# Patient Record
Sex: Female | Born: 1955 | Race: White | Hispanic: No | Marital: Married | Smoking: Never smoker
Health system: Southern US, Community
[De-identification: ages and names within clinical notes are randomized; demographics above are authoritative.]

## PROBLEM LIST (undated history)

## (undated) DIAGNOSIS — B019 Varicella without complication: Secondary | ICD-10-CM

## (undated) DIAGNOSIS — M1712 Unilateral primary osteoarthritis, left knee: Secondary | ICD-10-CM

## (undated) DIAGNOSIS — T7840XA Allergy, unspecified, initial encounter: Secondary | ICD-10-CM

## (undated) DIAGNOSIS — M810 Age-related osteoporosis without current pathological fracture: Secondary | ICD-10-CM

## (undated) DIAGNOSIS — M5136 Other intervertebral disc degeneration, lumbar region: Secondary | ICD-10-CM

## (undated) DIAGNOSIS — M51369 Other intervertebral disc degeneration, lumbar region without mention of lumbar back pain or lower extremity pain: Secondary | ICD-10-CM

## (undated) DIAGNOSIS — F32A Depression, unspecified: Secondary | ICD-10-CM

## (undated) DIAGNOSIS — B029 Zoster without complications: Secondary | ICD-10-CM

## (undated) DIAGNOSIS — M1990 Unspecified osteoarthritis, unspecified site: Secondary | ICD-10-CM

## (undated) DIAGNOSIS — I10 Essential (primary) hypertension: Secondary | ICD-10-CM

## (undated) DIAGNOSIS — R519 Headache, unspecified: Secondary | ICD-10-CM

## (undated) HISTORY — DX: Age-related osteoporosis without current pathological fracture: M81.0

## (undated) HISTORY — PX: JOINT REPLACEMENT: SHX530

## (undated) HISTORY — PX: KNEE ARTHROSCOPY W/ PARTIAL MEDIAL MENISCECTOMY: SHX1882

## (undated) HISTORY — PX: FRACTURE SURGERY: SHX138

## (undated) HISTORY — DX: Allergy, unspecified, initial encounter: T78.40XA

## (undated) HISTORY — PX: TONSILLECTOMY: SUR1361

## (undated) HISTORY — DX: Depression, unspecified: F32.A

## (undated) HISTORY — PX: CHONDROPLASTY: SHX5177

---

## 1992-12-14 HISTORY — PX: GANGLION CYST EXCISION: SHX1691

## 2001-07-08 ENCOUNTER — Encounter: Payer: Self-pay | Admitting: Infectious Diseases

## 2001-07-08 ENCOUNTER — Ambulatory Visit (HOSPITAL_COMMUNITY): Admission: RE | Admit: 2001-07-08 | Discharge: 2001-07-08 | Payer: Self-pay | Admitting: Infectious Diseases

## 2001-08-21 ENCOUNTER — Emergency Department (HOSPITAL_COMMUNITY): Admission: EM | Admit: 2001-08-21 | Discharge: 2001-08-21 | Payer: Self-pay | Admitting: *Deleted

## 2006-05-31 ENCOUNTER — Ambulatory Visit: Payer: Self-pay | Admitting: Gastroenterology

## 2008-08-06 ENCOUNTER — Ambulatory Visit: Payer: Self-pay | Admitting: Gastroenterology

## 2008-08-06 LAB — HM COLONOSCOPY

## 2011-01-28 LAB — HM PAP SMEAR

## 2011-06-25 HISTORY — PX: FRACTURE SURGERY: SHX138

## 2011-07-05 HISTORY — PX: JOINT REPLACEMENT: SHX530

## 2012-09-06 LAB — HM MAMMOGRAPHY

## 2015-09-30 DIAGNOSIS — M8580 Other specified disorders of bone density and structure, unspecified site: Secondary | ICD-10-CM

## 2015-09-30 DIAGNOSIS — E669 Obesity, unspecified: Secondary | ICD-10-CM

## 2015-09-30 DIAGNOSIS — F329 Major depressive disorder, single episode, unspecified: Secondary | ICD-10-CM

## 2015-09-30 DIAGNOSIS — F32A Depression, unspecified: Secondary | ICD-10-CM

## 2015-09-30 DIAGNOSIS — E739 Lactose intolerance, unspecified: Secondary | ICD-10-CM | POA: Insufficient documentation

## 2015-09-30 DIAGNOSIS — M8588 Other specified disorders of bone density and structure, other site: Secondary | ICD-10-CM | POA: Insufficient documentation

## 2015-09-30 DIAGNOSIS — E28319 Asymptomatic premature menopause: Secondary | ICD-10-CM | POA: Insufficient documentation

## 2015-09-30 DIAGNOSIS — I10 Essential (primary) hypertension: Secondary | ICD-10-CM | POA: Insufficient documentation

## 2015-09-30 DIAGNOSIS — Z800 Family history of malignant neoplasm of digestive organs: Secondary | ICD-10-CM | POA: Insufficient documentation

## 2015-10-02 ENCOUNTER — Encounter: Payer: Self-pay | Admitting: Unknown Physician Specialty

## 2015-10-02 ENCOUNTER — Ambulatory Visit (INDEPENDENT_AMBULATORY_CARE_PROVIDER_SITE_OTHER): Payer: BC Managed Care – PPO | Admitting: Unknown Physician Specialty

## 2015-10-02 ENCOUNTER — Other Ambulatory Visit: Payer: Self-pay | Admitting: Unknown Physician Specialty

## 2015-10-02 VITALS — BP 137/83 | HR 71 | Temp 97.8°F | Ht 66.7 in | Wt 182.0 lb

## 2015-10-02 DIAGNOSIS — Z23 Encounter for immunization: Secondary | ICD-10-CM | POA: Diagnosis not present

## 2015-10-02 DIAGNOSIS — M545 Low back pain: Secondary | ICD-10-CM | POA: Diagnosis not present

## 2015-10-02 DIAGNOSIS — Z780 Asymptomatic menopausal state: Secondary | ICD-10-CM | POA: Diagnosis not present

## 2015-10-02 DIAGNOSIS — Z0000 Encounter for general adult medical examination without abnormal findings: Secondary | ICD-10-CM

## 2015-10-02 LAB — UA/M W/RFLX CULTURE, ROUTINE
Bilirubin, UA: NEGATIVE
Glucose, UA: NEGATIVE
Ketones, UA: NEGATIVE
Leukocytes, UA: NEGATIVE
Nitrite, UA: NEGATIVE
Protein, UA: NEGATIVE
RBC, UA: NEGATIVE
Specific Gravity, UA: 1.015 (ref 1.005–1.030)
Urobilinogen, Ur: 0.2 mg/dL (ref 0.2–1.0)
pH, UA: 5 (ref 5.0–7.5)

## 2015-10-02 MED ORDER — NAPROXEN 500 MG PO TABS
500.0000 mg | ORAL_TABLET | Freq: Two times a day (BID) | ORAL | Status: DC
Start: 2015-10-02 — End: 2017-09-07

## 2015-10-02 MED ORDER — CYCLOBENZAPRINE HCL 10 MG PO TABS: 10.0000 mg | ORAL_TABLET | Freq: Three times a day (TID) | ORAL | Status: DC | PRN

## 2015-10-02 NOTE — Progress Notes (Signed)
BP 137/83 mmHg  Pulse 71  Temp(Src) 97.8 F (36.6 C)  Ht 5' 6.7" (1.694 m)  Wt 182 lb (82.555 kg)  BMI 28.77 kg/m2  SpO2 97%  LMP  (LMP Unknown)   Subjective:    Patient ID: Mary Wood, female    DOB: 07/28/1956, 59 y.o.   MRN: [id number]  HPI: Mary Wood is a 59 y.o. female  Chief Complaint  Patient presents with  . Annual Exam  . Back Pain    pt states she has back pain that started Sunday night   Back pain this weekend after doing a lot of yard work.    Relevant past medical, surgical, family and social history reviewed and updated as indicated. Interim medical history since our last visit reviewed. Allergies and medications reviewed and updated.  Review of Systems  Constitutional: Negative.   HENT: Negative.   Eyes: Negative.   Respiratory: Negative.   Cardiovascular: Negative.   Gastrointestinal: Negative.   Endocrine: Negative.   Genitourinary: Negative.   Musculoskeletal: Positive for back pain.  Skin: Negative.   Allergic/Immunologic: Negative.   Neurological: Negative.   Hematological: Negative.   Psychiatric/Behavioral: Negative.    Depression screen PHQ 2/9 10/02/2015  Decreased Interest 1  Down, Depressed, Hopeless 1  PHQ - 2 Score 2  Altered sleeping 1  Tired, decreased energy 1  Change in appetite 0  Feeling bad or failure about yourself  0  Trouble concentrating 0  Moving slowly or fidgety/restless 0  Suicidal thoughts 0  PHQ-9 Score 4     Per HPI unless specifically indicated above     Objective:    BP 137/83 mmHg  Pulse 71  Temp(Src) 97.8 F (36.6 C)  Ht 5' 6.7" (1.694 m)  Wt 182 lb (82.555 kg)  BMI 28.77 kg/m2  SpO2 97%  LMP  (LMP Unknown)  Wt Readings from Last 3 Encounters:  10/02/15 182 lb (82.555 kg)  02/04/15 211 lb (95.709 kg)    Physical Exam  Constitutional: She is oriented to person, place, and time. She appears well-developed and well-nourished.  HENT:  Head: Normocephalic and atraumatic.  Eyes: Pupils  are equal, round, and reactive to light. Right eye exhibits no discharge. Left eye exhibits no discharge. No scleral icterus.  Neck: Normal range of motion. Neck supple. Carotid bruit is not present. No thyromegaly present.  Cardiovascular: Normal rate, regular rhythm and normal heart sounds.  Exam reveals no gallop and no friction rub.   No murmur heard. Pulmonary/Chest: Effort normal and breath sounds normal. No respiratory distress. She has no wheezes. She has no rales.  Abdominal: Soft. Bowel sounds are normal. There is no tenderness. There is no rebound.  Genitourinary: Vagina normal and uterus normal. No breast swelling, tenderness or discharge. Cervix exhibits no motion tenderness, no discharge and no friability. Right adnexum displays no mass, no tenderness and no fullness. Left adnexum displays no mass, no tenderness and no fullness.  Musculoskeletal: Normal range of motion.  Lymphadenopathy:    She has no cervical adenopathy.  Neurological: She is alert and oriented to person, place, and time.  Skin: Skin is warm, dry and intact. No rash noted.  Psychiatric: She has a normal mood and affect. Her speech is normal and behavior is normal. Judgment and thought content normal. Cognition and memory are normal.     Assessment & Plan:   Problem List Items Addressed This Visit    None    Visit Diagnoses    Bilateral low back  pain, with sciatica presence unspecified    -  Primary    Relevant Medications    ibuprofen (ADVIL,MOTRIN) 800 MG tablet    naproxen (NAPROSYN) 500 MG tablet    cyclobenzaprine (FLEXERIL) 10 MG tablet    Other Relevant Orders    UA/M w/rflx Culture, Routine    Routine general medical examination at a health care facility        Relevant Orders    CBC with Differential/Platelet    Comprehensive metabolic panel    IGP, Aptima HPV, rfx 16/18,45    TSH    Lipid Panel w/o Chol/HDL Ratio    Varicella-zoster vaccine subcutaneous    MM DIGITAL SCREENING BILATERAL     HIV antibody    Hepatitis C antibody    Menopause        Relevant Orders    DG Bone Density        Follow up plan: Return if symptoms worsen or fail to improve.

## 2015-10-02 NOTE — Patient Instructions (Signed)
Please do call to schedule your mammogram; the number to schedule one at either [facility] or [facility] is [phone]   

## 2015-10-03 ENCOUNTER — Ambulatory Visit
Admission: RE | Admit: 2015-10-03 | Discharge: 2015-10-03 | Disposition: A | Discharge status: Home / Self Care | Payer: BC Managed Care – PPO | Source: Ambulatory Visit | Attending: Unknown Physician Specialty | Admitting: Unknown Physician Specialty

## 2015-10-03 ENCOUNTER — Encounter: Payer: Self-pay | Admitting: Unknown Physician Specialty

## 2015-10-03 DIAGNOSIS — Z1231 Encounter for screening mammogram for malignant neoplasm of breast: Secondary | ICD-10-CM | POA: Diagnosis not present

## 2015-10-03 DIAGNOSIS — Z0000 Encounter for general adult medical examination without abnormal findings: Secondary | ICD-10-CM

## 2015-10-03 DIAGNOSIS — Z780 Asymptomatic menopausal state: Secondary | ICD-10-CM | POA: Insufficient documentation

## 2015-10-03 LAB — CBC WITH DIFFERENTIAL/PLATELET
Basophils Absolute: 0 10*3/uL (ref 0.0–0.2)
Basos: 0 %
EOS (ABSOLUTE): 0.1 10*3/uL (ref 0.0–0.4)
Eos: 2 %
Hematocrit: 37.7 % (ref 34.0–46.6)
Hemoglobin: 12.8 g/dL (ref 11.1–15.9)
Immature Grans (Abs): 0 10*3/uL (ref 0.0–0.1)
Immature Granulocytes: 0 %
Lymphocytes Absolute: 2.4 10*3/uL (ref 0.7–3.1)
Lymphs: 40 %
MCH: 30.2 pg (ref 26.6–33.0)
MCHC: 34 g/dL (ref 31.5–35.7)
MCV: 89 fL (ref 79–97)
Monocytes Absolute: 0.4 10*3/uL (ref 0.1–0.9)
Monocytes: 7 %
Neutrophils Absolute: 3.1 10*3/uL (ref 1.4–7.0)
Neutrophils: 51 %
Platelets: 327 10*3/uL (ref 150–379)
RBC: 4.24 x10E6/uL (ref 3.77–5.28)
RDW: 13.5 % (ref 12.3–15.4)
WBC: 5.9 10*3/uL (ref 3.4–10.8)

## 2015-10-03 LAB — COMPREHENSIVE METABOLIC PANEL
ALBUMIN: 4.7 g/dL (ref 3.6–4.8)
ALK PHOS: 64 IU/L (ref 39–117)
ALT: 17 IU/L (ref 0–32)
AST: 17 IU/L (ref 0–40)
Albumin/Globulin Ratio: 1.9 (ref 1.1–2.5)
BUN / CREAT RATIO: 26 (ref 11–26)
BUN: 18 mg/dL (ref 8–27)
Bilirubin Total: 0.4 mg/dL (ref 0.0–1.2)
CO2: 24 mmol/L (ref 18–29)
CREATININE: 0.7 mg/dL (ref 0.57–1.00)
Calcium: 9.4 mg/dL (ref 8.7–10.3)
Chloride: 103 mmol/L (ref 97–106)
GFR calc Af Amer: 108 mL/min/{1.73_m2} (ref >59)
GFR calc non Af Amer: 94 mL/min/{1.73_m2} (ref >59)
GLUCOSE: 102 mg/dL — AB (ref 65–99)
Globulin, Total: 2.5 g/dL (ref 1.5–4.5)
Potassium: 4.8 mmol/L (ref 3.5–5.2)
Sodium: 143 mmol/L (ref 136–144)
Total Protein: 7.2 g/dL (ref 6.0–8.5)

## 2015-10-03 LAB — TSH: TSH: 1.06 u[IU]/mL (ref 0.450–4.500)

## 2015-10-03 LAB — HEPATITIS C ANTIBODY

## 2015-10-03 LAB — LIPID PANEL W/O CHOL/HDL RATIO
CHOLESTEROL TOTAL: 180 mg/dL (ref 100–199)
HDL: 60 mg/dL (ref >39)
LDL Calculated: 107 mg/dL — ABNORMAL HIGH (ref 0–99)
TRIGLYCERIDES: 65 mg/dL (ref 0–149)
VLDL CHOLESTEROL CAL: 13 mg/dL (ref 5–40)

## 2015-10-03 LAB — HIV ANTIBODY (ROUTINE TESTING W REFLEX): HIV Screen 4th Generation wRfx: NONREACTIVE

## 2015-10-03 NOTE — Progress Notes (Signed)
Quick Note:  Normal labs. Patient notified by letter. ______ 

## 2015-10-04 ENCOUNTER — Telehealth: Payer: Self-pay | Admitting: Family Medicine

## 2015-10-04 NOTE — Telephone Encounter (Signed)
Please make sure she is scheduled for follow up imaging for incomplete mammogram. Thanks!

## 2015-10-07 ENCOUNTER — Telehealth: Payer: Self-pay | Admitting: Family Medicine

## 2015-10-07 LAB — IGP, APTIMA HPV, RFX 16/18,45
HPV Genotype 16: NEGATIVE
HPV Genotype 18,45: NEGATIVE
PAP Smear Comment: 0

## 2015-10-07 NOTE — Telephone Encounter (Signed)
Got a fax from [name] about the additional imaging orders for this patient. Mary Wood must sign order when she returns and then the patient can be scheduled for the additional images.

## 2015-10-07 NOTE — Telephone Encounter (Signed)
Patient notified. Patient said she had no questions.

## 2015-10-07 NOTE — Telephone Encounter (Signed)
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 Nothing to worry about, she just needs another pap next year.

## 2015-10-09 ENCOUNTER — Other Ambulatory Visit: Payer: Self-pay | Admitting: Unknown Physician Specialty

## 2015-10-09 DIAGNOSIS — R928 Other abnormal and inconclusive findings on diagnostic imaging of breast: Secondary | ICD-10-CM

## 2015-10-09 NOTE — Telephone Encounter (Signed)
Toniann FailWendy from [facility] returned my call and stated that she will call and schedule the patient's follow-up appointments.

## 2015-10-09 NOTE — Telephone Encounter (Signed)
Elnita MaxwellCheryl signed the order and I faxed it back to [facility]. I called and left them a voicemail asking if I call to schedule the patient or if they call the patient to schedule the additional images.

## 2015-10-10 ENCOUNTER — Ambulatory Visit
Admission: RE | Admit: 2015-10-10 | Discharge: 2015-10-10 | Disposition: A | Discharge status: Home / Self Care | Payer: BC Managed Care – PPO | Source: Ambulatory Visit | Attending: Unknown Physician Specialty | Admitting: Unknown Physician Specialty

## 2015-10-10 DIAGNOSIS — R928 Other abnormal and inconclusive findings on diagnostic imaging of breast: Secondary | ICD-10-CM

## 2015-10-21 ENCOUNTER — Encounter: Payer: Self-pay | Admitting: Unknown Physician Specialty

## 2016-07-29 ENCOUNTER — Ambulatory Visit
Admission: RE | Admit: 2016-07-29 | Discharge: 2016-07-29 | Disposition: A | Discharge status: Home / Self Care | Payer: BC Managed Care – PPO | Source: Ambulatory Visit | Attending: Unknown Physician Specialty | Admitting: Unknown Physician Specialty

## 2016-07-29 ENCOUNTER — Ambulatory Visit (INDEPENDENT_AMBULATORY_CARE_PROVIDER_SITE_OTHER): Payer: BC Managed Care – PPO | Admitting: Unknown Physician Specialty

## 2016-07-29 ENCOUNTER — Ambulatory Visit: Payer: BC Managed Care – PPO | Admitting: Unknown Physician Specialty

## 2016-07-29 ENCOUNTER — Encounter: Payer: Self-pay | Admitting: Unknown Physician Specialty

## 2016-07-29 DIAGNOSIS — M25551 Pain in right hip: Secondary | ICD-10-CM

## 2016-07-29 DIAGNOSIS — I10 Essential (primary) hypertension: Secondary | ICD-10-CM

## 2016-07-29 DIAGNOSIS — M25552 Pain in left hip: Secondary | ICD-10-CM | POA: Insufficient documentation

## 2016-07-29 NOTE — Assessment & Plan Note (Signed)
BP elevated today.  This is the first one.  She will begin to check it at home

## 2016-07-29 NOTE — Assessment & Plan Note (Signed)
X-ray.  Discuss tylenol and Glucosamine

## 2016-07-29 NOTE — Progress Notes (Addendum)
BP (!) 169/90 (BP Location: Left Arm, Patient Position: Sitting, Cuff Size: Large)   Pulse 74   Temp 97.9 F (36.6 C)   Ht 5' 7.2" (1.707 m)   Wt 184 lb 9.6 oz (83.7 kg)   LMP  (LMP Unknown)   SpO2 95%   BMI 28.74 kg/m    Subjective:    Patient ID: Mary Wood, female    DOB: 03/11/1956, 60 y.o.   MRN: [id number]  HPI: Mary HurtDian Wood is a 60 y.o. female  Chief Complaint  Patient presents with  . Hip Pain    pt states she has bilateral hip pain that started on and off about 6 months ago, states pain has gotten worse over the last few months. States they get stiff after sitting for a while    Hip pain Pt is here with complaints of bilateral hip pain off and on for about 6 months and getting worse.  Describes a dull ache and takes a little while to "get going."  Once she does it feels better.  No locking.  Does feel the joints aren't really aligned.  She thinks it might be arthritis  Relevant past medical, surgical, family and social history reviewed and updated as indicated. Interim medical history since our last visit reviewed. Allergies and medications reviewed and updated.  Review of Systems  Per HPI unless specifically indicated above     Objective:    BP (!) 169/90 (BP Location: Left Arm, Patient Position: Sitting, Cuff Size: Large)   Pulse 74   Temp 97.9 F (36.6 C)   Ht 5' 7.2" (1.707 m)   Wt 184 lb 9.6 oz (83.7 kg)   LMP  (LMP Unknown)   SpO2 95%   BMI 28.74 kg/m   Wt Readings from Last 3 Encounters:  07/29/16 184 lb 9.6 oz (83.7 kg)  10/02/15 182 lb (82.6 kg)  02/04/15 211 lb (95.7 kg)    Physical Exam  Constitutional: She is oriented to person, place, and time. She appears well-developed and well-nourished. No distress.  HENT:  Head: Normocephalic and atraumatic.  Eyes: Conjunctivae and lids are normal. Right eye exhibits no discharge. Left eye exhibits no discharge. No scleral icterus.  Cardiovascular: Normal rate.   Pulmonary/Chest: Effort normal.    Abdominal: Normal appearance. There is no splenomegaly or hepatomegaly.  Musculoskeletal:       Right hip: She exhibits normal range of motion, normal strength, no tenderness, no bony tenderness, no swelling, no crepitus, no deformity and no laceration.       Left hip: She exhibits normal range of motion, normal strength, no tenderness, no bony tenderness, no swelling, no crepitus, no deformity and no laceration.  Neurological: She is alert and oriented to person, place, and time.  Skin: Skin is intact. No rash noted. No pallor.  Psychiatric: She has a normal mood and affect. Her behavior is normal. Judgment and thought content normal.  Nursing note and vitals reviewed.   Results for orders placed or performed in visit on 10/02/15  UA/M w/rflx Culture, Routine  Result Value Ref Range   Specific Gravity, UA 1.015 1.005 - 1.030   pH, UA 5.0 5.0 - 7.5   Color, UA Yellow Yellow   Appearance Ur Clear Clear   Leukocytes, UA Negative Negative   Protein, UA Negative Negative/Trace   Glucose, UA Negative Negative   Ketones, UA Negative Negative   RBC, UA Negative Negative   Bilirubin, UA Negative Negative   Urobilinogen, Ur 0.2 0.2 -  1.0 mg/dL   Nitrite, UA Negative Negative  CBC with Differential/Platelet  Result Value Ref Range   WBC 5.9 3.4 - 10.8 x10E3/uL   RBC 4.24 3.77 - 5.28 x10E6/uL   Hemoglobin 12.8 11.1 - 15.9 g/dL   Hematocrit 57.837.7 46.934.0 - 46.6 %   MCV 89 79 - 97 fL   MCH 30.2 26.6 - 33.0 pg   MCHC 34.0 31.5 - 35.7 g/dL   RDW 62.913.5 52.812.3 - 41.315.4 %   Platelets 327 150 - 379 x10E3/uL   Neutrophils 51 %   Lymphs 40 %   Monocytes 7 %   Eos 2 %   Basos 0 %   Neutrophils Absolute 3.1 1.4 - 7.0 x10E3/uL   Lymphocytes Absolute 2.4 0.7 - 3.1 x10E3/uL   Monocytes Absolute 0.4 0.1 - 0.9 x10E3/uL   EOS (ABSOLUTE) 0.1 0.0 - 0.4 x10E3/uL   Basophils Absolute 0.0 0.0 - 0.2 x10E3/uL   Immature Granulocytes 0 %   Immature Grans (Abs) 0.0 0.0 - 0.1 x10E3/uL  Comprehensive metabolic  panel  Result Value Ref Range   Glucose 102 (H) 65 - 99 mg/dL   BUN 18 8 - 27 mg/dL   Creatinine, Ser 2.440.70 0.57 - 1.00 mg/dL   GFR calc non Af Amer 94 >59 mL/min/1.73   GFR calc Af Amer 108 >59 mL/min/1.73   BUN/Creatinine Ratio 26 11 - 26   Sodium 143 136 - 144 mmol/L   Potassium 4.8 3.5 - 5.2 mmol/L   Chloride 103 97 - 106 mmol/L   CO2 24 18 - 29 mmol/L   Calcium 9.4 8.7 - 10.3 mg/dL   Total Protein 7.2 6.0 - 8.5 g/dL   Albumin 4.7 3.6 - 4.8 g/dL   Globulin, Total 2.5 1.5 - 4.5 g/dL   Albumin/Globulin Ratio 1.9 1.1 - 2.5   Bilirubin Total 0.4 0.0 - 1.2 mg/dL   Alkaline Phosphatase 64 39 - 117 IU/L   AST 17 0 - 40 IU/L   ALT 17 0 - 32 IU/L  TSH  Result Value Ref Range   TSH 1.060 0.450 - 4.500 uIU/mL  Lipid Panel w/o Chol/HDL Ratio  Result Value Ref Range   Cholesterol, Total 180 100 - 199 mg/dL   Triglycerides 65 0 - 149 mg/dL   HDL 60 >01>39 mg/dL   VLDL Cholesterol Cal 13 5 - 40 mg/dL   LDL Calculated 027107 (H) 0 - 99 mg/dL  HIV antibody  Result Value Ref Range   HIV Screen 4th Generation wRfx Non Reactive Non Reactive  Hepatitis C antibody  Result Value Ref Range   Hep C Virus Ab <0.1 0.0 - 0.9 s/co ratio  IGP, Aptima HPV, rfx 16/18,45  Result Value Ref Range   DIAGNOSIS: Comment    Specimen adequacy: Comment    CLINICIAN PROVIDED ICD10: Comment    Performed by: Comment    PAP SMEAR COMMENT .    Note: Comment    Test Methodology Comment    HPV Genotype 16 Negative Negative   HPV Genotype 18,45 Negative Negative  Dictation #1 [id number]  [id number]  [id number]:[id number]    Assessment & Plan:   Problem List Items Addressed This Visit      Unprioritized   Hip pain, bilateral    X-ray.  Discuss tylenol and Glucosamine      Relevant Orders   DG HIPS BILAT WITH PELVIS 2V   Hypertension    BP elevated today.  This is the first one.  She will begin to check it at  home       Other Visit Diagnoses   None.      Follow up plan: Return if symptoms worsen or fail to  improve.

## 2016-08-05 ENCOUNTER — Encounter: Payer: Self-pay | Admitting: Unknown Physician Specialty

## 2016-08-26 ENCOUNTER — Other Ambulatory Visit: Payer: Self-pay | Admitting: Unknown Physician Specialty

## 2016-08-26 DIAGNOSIS — Z1231 Encounter for screening mammogram for malignant neoplasm of breast: Secondary | ICD-10-CM

## 2016-09-24 ENCOUNTER — Encounter: Payer: Self-pay | Admitting: Unknown Physician Specialty

## 2016-10-07 ENCOUNTER — Other Ambulatory Visit: Payer: Self-pay | Admitting: Unknown Physician Specialty

## 2016-10-07 ENCOUNTER — Encounter: Payer: Self-pay | Admitting: Unknown Physician Specialty

## 2016-10-07 ENCOUNTER — Ambulatory Visit
Admission: RE | Admit: 2016-10-07 | Discharge: 2016-10-07 | Disposition: A | Discharge status: Home / Self Care | Payer: BC Managed Care – PPO | Source: Ambulatory Visit | Attending: Unknown Physician Specialty | Admitting: Unknown Physician Specialty

## 2016-10-07 DIAGNOSIS — Z1231 Encounter for screening mammogram for malignant neoplasm of breast: Secondary | ICD-10-CM

## 2017-09-02 ENCOUNTER — Encounter: Payer: Self-pay | Admitting: Unknown Physician Specialty

## 2017-09-06 ENCOUNTER — Encounter: Payer: BC Managed Care – PPO | Admitting: Unknown Physician Specialty

## 2017-09-06 ENCOUNTER — Encounter: Payer: Self-pay | Admitting: Unknown Physician Specialty

## 2017-09-06 ENCOUNTER — Ambulatory Visit (INDEPENDENT_AMBULATORY_CARE_PROVIDER_SITE_OTHER): Payer: BC Managed Care – PPO | Admitting: Unknown Physician Specialty

## 2017-09-06 VITALS — BP 150/83 | HR 81 | Temp 97.6°F | Ht 66.5 in | Wt 197.6 lb

## 2017-09-06 DIAGNOSIS — Z23 Encounter for immunization: Secondary | ICD-10-CM | POA: Diagnosis not present

## 2017-09-06 DIAGNOSIS — M8580 Other specified disorders of bone density and structure, unspecified site: Secondary | ICD-10-CM

## 2017-09-06 DIAGNOSIS — Z0000 Encounter for general adult medical examination without abnormal findings: Secondary | ICD-10-CM | POA: Diagnosis not present

## 2017-09-06 DIAGNOSIS — I10 Essential (primary) hypertension: Secondary | ICD-10-CM

## 2017-09-06 NOTE — Assessment & Plan Note (Signed)
Elevated today.  Working on weight loss and recheck in 3 months

## 2017-09-06 NOTE — Progress Notes (Signed)
BP (!) 150/83 (BP Location: Left Arm, Cuff Size: Normal)   Pulse 81   Temp 97.6 F (36.4 C)   Ht 5' 6.5" (1.689 m)   Wt 197 lb 9.6 oz (89.6 kg)   LMP  (LMP Unknown)   SpO2 98%   BMI 31.42 kg/m    Subjective:    Patient ID: Mary Wood, female    DOB: 06-24-1956, 61 y.o.   MRN: [id number]  HPI: Mary Wood is a 61 y.o. female  Chief Complaint  Patient presents with  . Annual Exam   Left hip Pt with left hip pain which is worsening.  Worse with stairs and sitting and stands up.  Points to her "butt".  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    Relevant past medical, surgical, family and social history reviewed and updated as indicated. Interim medical history since our last visit reviewed. Allergies and medications reviewed and updated.  Review of Systems  Per HPI unless specifically indicated above     Objective:    BP (!) 150/83 (BP Location: Left Arm, Cuff Size: Normal)   Pulse 81   Temp 97.6 F (36.4 C)   Ht 5' 6.5" (1.689 m)   Wt 197 lb 9.6 oz (89.6 kg)   LMP  (LMP Unknown)   SpO2 98%   BMI 31.42 kg/m   Wt Readings from Last 3 Encounters:  09/06/17 197 lb 9.6 oz (89.6 kg)  07/29/16 184 lb 9.6 oz (83.7 kg)  10/02/15 182 lb (82.6 kg)    Physical Exam  Constitutional: She is oriented to person, place, and time. She appears well-developed and well-nourished. No distress.  HENT:  Head: Normocephalic and atraumatic.  Eyes: Conjunctivae and lids are normal. Right eye exhibits no discharge. Left eye exhibits no discharge. No scleral icterus.  Neck: Normal range of motion. Neck supple. No JVD present. Carotid bruit is not present.  Cardiovascular: Normal rate, regular rhythm and normal heart sounds.   Pulmonary/Chest: Effort normal and breath sounds normal.  Abdominal: Normal appearance. There is no splenomegaly or hepatomegaly.  Musculoskeletal: Normal range of motion.  Neurological: She is alert and oriented to person, place, and time.  Skin:  Skin is warm, dry and intact. No rash noted. No pallor.  Psychiatric: She has a normal mood and affect. Her behavior is normal. Judgment and thought content normal.    Results for orders placed or performed in visit on 10/02/15  UA/M w/rflx Culture, Routine  Result Value Ref Range   Specific Gravity, UA 1.015 1.005 - 1.030   pH, UA 5.0 5.0 - 7.5   Color, UA Yellow Yellow   Appearance Ur Clear Clear   Leukocytes, UA Negative Negative   Protein, UA Negative Negative/Trace   Glucose, UA Negative Negative   Ketones, UA Negative Negative   RBC, UA Negative Negative   Bilirubin, UA Negative Negative   Urobilinogen, Ur 0.2 0.2 - 1.0 mg/dL   Nitrite, UA Negative Negative  CBC with Differential/Platelet  Result Value Ref Range   WBC 5.9 3.4 - 10.8 x10E3/uL   RBC 4.24 3.77 - 5.28 x10E6/uL   Hemoglobin 12.8 11.1 - 15.9 g/dL   Hematocrit 40.9 81.1 - 46.6 %   MCV 89 79 - 97 fL   MCH 30.2 26.6 - 33.0 pg   MCHC 34.0 31.5 - 35.7 g/dL   RDW 91.4 78.2 - 95.6 %   Platelets 327 150 - 379 x10E3/uL   Neutrophils 51 %   Lymphs  40 %   Monocytes 7 %   Eos 2 %   Basos 0 %   Neutrophils Absolute 3.1 1.4 - 7.0 x10E3/uL   Lymphocytes Absolute 2.4 0.7 - 3.1 x10E3/uL   Monocytes Absolute 0.4 0.1 - 0.9 x10E3/uL   EOS (ABSOLUTE) 0.1 0.0 - 0.4 x10E3/uL   Basophils Absolute 0.0 0.0 - 0.2 x10E3/uL   Immature Granulocytes 0 %   Immature Grans (Abs) 0.0 0.0 - 0.1 x10E3/uL  Comprehensive metabolic panel  Result Value Ref Range   Glucose 102 (H) 65 - 99 mg/dL   BUN 18 8 - 27 mg/dL   Creatinine, Ser 1.61 0.57 - 1.00 mg/dL   GFR calc non Af Amer 94 >59 mL/min/1.73   GFR calc Af Amer 108 >59 mL/min/1.73   BUN/Creatinine Ratio 26 11 - 26   Sodium 143 136 - 144 mmol/L   Potassium 4.8 3.5 - 5.2 mmol/L   Chloride 103 97 - 106 mmol/L   CO2 24 18 - 29 mmol/L   Calcium 9.4 8.7 - 10.3 mg/dL   Total Protein 7.2 6.0 - 8.5 g/dL   Albumin 4.7 3.6 - 4.8 g/dL   Globulin, Total 2.5 1.5 - 4.5 g/dL   Albumin/Globulin  Ratio 1.9 1.1 - 2.5   Bilirubin Total 0.4 0.0 - 1.2 mg/dL   Alkaline Phosphatase 64 39 - 117 IU/L   AST 17 0 - 40 IU/L   ALT 17 0 - 32 IU/L  TSH  Result Value Ref Range   TSH 1.060 0.450 - 4.500 uIU/mL  Lipid Panel w/o Chol/HDL Ratio  Result Value Ref Range   Cholesterol, Total 180 100 - 199 mg/dL   Triglycerides 65 0 - 149 mg/dL   HDL 60 >09 mg/dL   VLDL Cholesterol Cal 13 5 - 40 mg/dL   LDL Calculated 604 (H) 0 - 99 mg/dL  HIV antibody  Result Value Ref Range   HIV Screen 4th Generation wRfx Non Reactive Non Reactive  Hepatitis C antibody  Result Value Ref Range   Hep C Virus Ab <0.1 0.0 - 0.9 s/co ratio  IGP, Aptima HPV, rfx 16/18,45  Result Value Ref Range   DIAGNOSIS: Comment    Specimen adequacy: Comment    Clinician Provided ICD10 Comment    Performed by: Comment    PAP Smear Comment .    Note: Comment    Test Methodology Comment    HPV Genotype 16 Negative Negative   HPV Genotype 18,45 Negative Negative      Assessment & Plan:   Problem List Items Addressed This Visit      Unprioritized   Hypertension    Elevated today.  Working on weight loss and recheck in 3 months      Osteopenia    Continue with Vitamin D and Calcium       Other Visit Diagnoses    Need for Tdap vaccination    -  Primary   Relevant Orders   Tdap vaccine greater than or equal to 7yo IM (Completed)   Annual physical exam       Relevant Orders   MM DIGITAL SCREENING BILATERAL   CBC with Differential/Platelet   Comprehensive metabolic panel   Lipid Panel w/o Chol/HDL Ratio   TSH   VITAMIN D 25 Hydroxy (Vit-D Deficiency, Fractures)       Follow up plan: Return in about 3 months (around 12/06/2017) for BP.

## 2017-09-06 NOTE — Patient Instructions (Addendum)
Tdap Vaccine (Tetanus, Diphtheria and Pertussis): What You Need to Know 1. Why get vaccinated? Tetanus, diphtheria and pertussis are very serious diseases. Tdap vaccine can protect us from these diseases. And, Tdap vaccine given to pregnant women can protect newborn babies against pertussis. TETANUS (Lockjaw) is rare in the [place] today. It causes painful muscle tightening and stiffness, usually all over the body.  It can lead to tightening of muscles in the head and neck so you can't open your mouth, swallow, or sometimes even breathe. Tetanus kills about 1 out of 10 people who are infected even after receiving the best medical care.  DIPHTHERIA is also rare in the [place] today. It can cause a thick coating to form in the back of the throat.  It can lead to breathing problems, heart failure, paralysis, and death.  PERTUSSIS (Whooping Cough) causes severe coughing spells, which can cause difficulty breathing, vomiting and disturbed sleep.  It can also lead to weight loss, incontinence, and rib fractures. Up to 2 in 100 adolescents and 5 in 100 adults with pertussis are hospitalized or have complications, which could include pneumonia or death.  These diseases are caused by bacteria. Diphtheria and pertussis are spread from person to person through secretions from coughing or sneezing. Tetanus enters the body through cuts, scratches, or wounds. Before vaccines, as many as 200,000 cases of diphtheria, 200,000 cases of pertussis, and hundreds of cases of tetanus, were reported in the [place] each year. Since vaccination began, reports of cases for tetanus and diphtheria have dropped by about 99% and for pertussis by about 80%. 2. Tdap vaccine Tdap vaccine can protect adolescents and adults from tetanus, diphtheria, and pertussis. One dose of Tdap is routinely given at age 11 or 12. People who did not get Tdap at that age should get it as soon as possible. Tdap is especially  important for healthcare professionals and anyone having close contact with a baby younger than 12 months. Pregnant women should get a dose of Tdap during every pregnancy, to protect the newborn from pertussis. Infants are most at risk for severe, life-threatening complications from pertussis. Another vaccine, called Td, protects against tetanus and diphtheria, but not pertussis. A Td booster should be given every 10 years. Tdap may be given as one of these boosters if you have never gotten Tdap before. Tdap may also be given after a severe cut or burn to prevent tetanus infection. Your doctor or the person giving you the vaccine can give you more information. Tdap may safely be given at the same time as other vaccines. 3. Some people should not get this vaccine  A person who has ever had a life-threatening allergic reaction after a previous dose of any diphtheria, tetanus or pertussis containing vaccine, OR has a severe allergy to any part of this vaccine, should not get Tdap vaccine. Tell the person giving the vaccine about any severe allergies.  Anyone who had coma or long repeated seizures within 7 days after a childhood dose of DTP or DTaP, or a previous dose of Tdap, should not get Tdap, unless a cause other than the vaccine was found. They can still get Td.  Talk to your doctor if you: ? have seizures or another nervous system problem, ? had severe pain or swelling after any vaccine containing diphtheria, tetanus or pertussis, ? ever had a condition called Guillain-Barr Syndrome (GBS), ? aren't feeling well on the day the shot is scheduled. 4. Risks With any medicine, including   vaccines, there is a chance of side effects. These are usually mild and go away on their own. Serious reactions are also possible but are rare. Most people who get Tdap vaccine do not have any problems with it. Mild problems following Tdap: (Did not interfere with activities)  Pain where the shot was given (about  3 in 4 adolescents or 2 in 3 adults)  Redness or swelling where the shot was given (about 1 person in 5)  Mild fever of at least 100.4F (up to about 1 in 25 adolescents or 1 in 100 adults)  Headache (about 3 or 4 people in 10)  Tiredness (about 1 person in 3 or 4)  Nausea, vomiting, diarrhea, stomach ache (up to 1 in 4 adolescents or 1 in 10 adults)  Chills, sore joints (about 1 person in 10)  Body aches (about 1 person in 3 or 4)  Rash, swollen glands (uncommon)  Moderate problems following Tdap: (Interfered with activities, but did not require medical attention)  Pain where the shot was given (up to 1 in 5 or 6)  Redness or swelling where the shot was given (up to about 1 in 16 adolescents or 1 in 12 adults)  Fever over 102F (about 1 in 100 adolescents or 1 in 250 adults)  Headache (about 1 in 7 adolescents or 1 in 10 adults)  Nausea, vomiting, diarrhea, stomach ache (up to 1 or 3 people in 100)  Swelling of the entire arm where the shot was given (up to about 1 in 500).  Severe problems following Tdap: (Unable to perform usual activities; required medical attention)  Swelling, severe pain, bleeding and redness in the arm where the shot was given (rare).  Problems that could happen after any vaccine:  People sometimes faint after a medical procedure, including vaccination. Sitting or lying down for about 15 minutes can help prevent fainting, and injuries caused by a fall. Tell your doctor if you feel dizzy, or have vision changes or ringing in the ears.  Some people get severe pain in the shoulder and have difficulty moving the arm where a shot was given. This happens very rarely.  Any medication can cause a severe allergic reaction. Such reactions from a vaccine are very rare, estimated at fewer than 1 in a million doses, and would happen within a few minutes to a few hours after the vaccination. As with any medicine, there is a very remote chance of a vaccine  causing a serious injury or death. The safety of vaccines is always being monitored. For more information, visit: www.cdc.gov/vaccinesafety/ 5. What if there is a serious problem? What should I look for? Look for anything that concerns you, such as signs of a severe allergic reaction, very high fever, or unusual behavior. Signs of a severe allergic reaction can include hives, swelling of the face and throat, difficulty breathing, a fast heartbeat, dizziness, and weakness. These would usually start a few minutes to a few hours after the vaccination. What should I do?  If you think it is a severe allergic reaction or other emergency that can't wait, call 9-1-1 or get the person to the nearest hospital. Otherwise, call your doctor.  Afterward, the reaction should be reported to the Vaccine Adverse Event Reporting System (VAERS). Your doctor might file this report, or you can do it yourself through the VAERS web site at www.vaers.hhs.gov, or by calling 1-800-822-7967. ? VAERS does not give medical advice. 6. The National Vaccine Injury Compensation Program The National   Vaccine Injury Compensation Program (VICP) is a federal program that was created to compensate people who may have been injured by certain vaccines. Persons who believe they may have been injured by a vaccine can learn about the program and about filing a claim by calling 1-813-652-5424 or visiting the VICP website at SpiritualWord.at. There is a time limit to file a claim for compensation. 7. How can I learn more?  Ask your doctor. He or she can give you the vaccine package insert or suggest other sources of information.  Call your local or state health department.  Contact the Centers for Disease Control and Prevention (CDC): ? Call 518-257-9460 (1-800-CDC-INFO) or ? Visit CDC's website at PicCapture.uy CDC Tdap Vaccine VIS (02/06/14) This information is not intended to replace advice given to you by your  health care provider. Make sure you discuss any questions you have with your health care provider.  DASH Eating Plan DASH stands for "Dietary Approaches to Stop Hypertension." The DASH eating plan is a healthy eating plan that has been shown to reduce high blood pressure (hypertension). It may also reduce your risk for type 2 diabetes, heart disease, and stroke. The DASH eating plan may also help with weight loss. What are tips for following this plan? General guidelines  Avoid eating more than 2,300 mg (milligrams) of salt (sodium) a day. If you have hypertension, you may need to reduce your sodium intake to 1,500 mg a day.  Limit alcohol intake to no more than 1 drink a day for nonpregnant women and 2 drinks a day for men. One drink equals 12 oz of beer, 5 oz of wine, or 1 oz of hard liquor.  Work with your health care provider to maintain a healthy body weight or to lose weight. Ask what an ideal weight is for you.  Get at least 30 minutes of exercise that causes your heart to beat faster (aerobic exercise) most days of the week. Activities may include walking, swimming, or biking.  Work with your health care provider or diet and nutrition specialist (dietitian) to adjust your eating plan to your individual calorie needs. Reading food labels  Check food labels for the amount of sodium per serving. Choose foods with less than 5 percent of the Daily Value of sodium. Generally, foods with less than 300 mg of sodium per serving fit into this eating plan.  To find whole grains, look for the word "whole" as the first word in the ingredient list. Shopping  Buy products labeled as "low-sodium" or "no salt added."  Buy fresh foods. Avoid canned foods and premade or frozen meals. Cooking  Avoid adding salt when cooking. Use salt-free seasonings or herbs instead of table salt or sea salt. Check with your health care provider or pharmacist before using salt substitutes.  Do not fry foods. Cook  foods using healthy methods such as baking, boiling, grilling, and broiling instead.  Cook with heart-healthy oils, such as olive, canola, soybean, or sunflower oil. Meal planning   Eat a balanced diet that includes: ? 5 or more servings of fruits and vegetables each day. At each meal, try to fill half of your plate with fruits and vegetables. ? Up to 6-8 servings of whole grains each day. ? Less than 6 oz of lean meat, poultry, or fish each day. A 3-oz serving of meat is about the same size as a deck of cards. One egg equals 1 oz. ? 2 servings of low-fat dairy each day. ? A  serving of nuts, seeds, or beans 5 times each week. ? Heart-healthy fats. Healthy fats called Omega-3 fatty acids are found in foods such as flaxseeds and coldwater fish, like sardines, salmon, and mackerel.  Limit how much you eat of the following: ? Canned or prepackaged foods. ? Food that is high in trans fat, such as fried foods. ? Food that is high in saturated fat, such as fatty meat. ? Sweets, desserts, sugary drinks, and other foods with added sugar. ? Full-fat dairy products.  Do not salt foods before eating.  Try to eat at least 2 vegetarian meals each week.  Eat more home-cooked food and less restaurant, buffet, and fast food.  When eating at a restaurant, ask that your food be prepared with less salt or no salt, if possible. What foods are recommended? The items listed may not be a complete list. Talk with your dietitian about what dietary choices are best for you. Grains Whole-grain or whole-wheat bread. Whole-grain or whole-wheat pasta. Brown rice. Orpah Cobb. Bulgur. Whole-grain and low-sodium cereals. Pita bread. Low-fat, low-sodium crackers. Whole-wheat flour tortillas. Vegetables Fresh or frozen vegetables (raw, steamed, roasted, or grilled). Low-sodium or reduced-sodium tomato and vegetable juice. Low-sodium or reduced-sodium tomato sauce and tomato paste. Low-sodium or reduced-sodium  canned vegetables. Fruits All fresh, dried, or frozen fruit. Canned fruit in natural juice (without added sugar). Meat and other protein foods Skinless chicken or [place]. Ground chicken or [place]. Pork with fat trimmed off. Fish and seafood. Egg whites. Dried beans, peas, or lentils. Unsalted nuts, nut butters, and seeds. Unsalted canned beans. Lean cuts of beef with fat trimmed off. Low-sodium, lean deli meat. Dairy Low-fat (1%) or fat-free (skim) milk. Fat-free, low-fat, or reduced-fat cheeses. Nonfat, low-sodium ricotta or cottage cheese. Low-fat or nonfat yogurt. Low-fat, low-sodium cheese. Fats and oils Soft margarine without trans fats. Vegetable oil. Low-fat, reduced-fat, or light mayonnaise and salad dressings (reduced-sodium). Canola, safflower, olive, soybean, and sunflower oils. Avocado. Seasoning and other foods Herbs. Spices. Seasoning mixes without salt. Unsalted popcorn and pretzels. Fat-free sweets. What foods are not recommended? The items listed may not be a complete list. Talk with your dietitian about what dietary choices are best for you. Grains Baked goods made with fat, such as croissants, muffins, or some breads. Dry pasta or rice meal packs. Vegetables Creamed or fried vegetables. Vegetables in a cheese sauce. Regular canned vegetables (not low-sodium or reduced-sodium). Regular canned tomato sauce and paste (not low-sodium or reduced-sodium). Regular tomato and vegetable juice (not low-sodium or reduced-sodium). Rosita Fire. Olives. Fruits Canned fruit in a light or heavy syrup. Fried fruit. Fruit in cream or butter sauce. Meat and other protein foods Fatty cuts of meat. Ribs. Fried meat. Tomasa Blase. Sausage. Bologna and other processed lunch meats. Salami. Fatback. Hotdogs. Bratwurst. Salted nuts and seeds. Canned beans with added salt. Canned or smoked fish. Whole eggs or egg yolks. Chicken or [place] with skin. Dairy Whole or 2% milk, cream, and half-and-half. Whole or  full-fat cream cheese. Whole-fat or sweetened yogurt. Full-fat cheese. Nondairy creamers. Whipped toppings. Processed cheese and cheese spreads. Fats and oils Butter. Stick margarine. Lard. Shortening. Ghee. Bacon fat. Tropical oils, such as coconut, palm kernel, or palm oil. Seasoning and other foods Salted popcorn and pretzels. Onion salt, garlic salt, seasoned salt, table salt, and sea salt. Worcestershire sauce. Tartar sauce. Barbecue sauce. Teriyaki sauce. Soy sauce, including reduced-sodium. Steak sauce. Canned and packaged gravies. Fish sauce. Oyster sauce. Cocktail sauce. Horseradish that you find on the shelf. Ketchup. Mustard.  Meat flavorings and tenderizers. Bouillon cubes. Hot sauce and Tabasco sauce. Premade or packaged marinades. Premade or packaged taco seasonings. Relishes. Regular salad dressings. Where to find more information:  National Heart, Lung, and Blood Institute: PopSteam.is  American Heart Association: www.heart.org Summary  The DASH eating plan is a healthy eating plan that has been shown to reduce high blood pressure (hypertension). It may also reduce your risk for type 2 diabetes, heart disease, and stroke.  With the DASH eating plan, you should limit salt (sodium) intake to 2,300 mg a day. If you have hypertension, you may need to reduce your sodium intake to 1,500 mg a day.  When on the DASH eating plan, aim to eat more fresh fruits and vegetables, whole grains, lean proteins, low-fat dairy, and heart-healthy fats.  Work with your health care provider or diet and nutrition specialist (dietitian) to adjust your eating plan to your individual calorie needs. This information is not intended to replace advice given to you by your health care provider. Make sure you discuss any questions you have with your health care provider. Document Released: 11/19/2011 Document Revised: 11/23/2016 Document Reviewed: 11/23/2016 Elsevier Interactive Patient Education  2017  [employer].

## 2017-09-06 NOTE — Assessment & Plan Note (Signed)
Continue with Vitamin D and Calcium

## 2017-09-06 NOTE — Addendum Note (Signed)
Addended by: Gabriel Cirri on: 09/06/2017 02:43 PM   Modules accepted: Orders

## 2017-09-07 ENCOUNTER — Other Ambulatory Visit: Payer: Self-pay | Admitting: Unknown Physician Specialty

## 2017-09-07 LAB — COMPREHENSIVE METABOLIC PANEL
A/G RATIO: 1.6 (ref 1.2–2.2)
ALT: 14 IU/L (ref 0–32)
AST: 19 IU/L (ref 0–40)
Albumin: 4.3 g/dL (ref 3.6–4.8)
Alkaline Phosphatase: 60 IU/L (ref 39–117)
BUN/Creatinine Ratio: 24 (ref 12–28)
BUN: 17 mg/dL (ref 8–27)
Bilirubin Total: 0.2 mg/dL (ref 0.0–1.2)
CALCIUM: 9 mg/dL (ref 8.7–10.3)
CO2: 24 mmol/L (ref 20–29)
Chloride: 103 mmol/L (ref 96–106)
Creatinine, Ser: 0.72 mg/dL (ref 0.57–1.00)
GFR calc Af Amer: 103 mL/min/{1.73_m2} (ref >59)
GFR, EST NON AFRICAN AMERICAN: 89 mL/min/{1.73_m2} (ref >59)
GLOBULIN, TOTAL: 2.7 g/dL (ref 1.5–4.5)
Glucose: 104 mg/dL — ABNORMAL HIGH (ref 65–99)
POTASSIUM: 3.8 mmol/L (ref 3.5–5.2)
SODIUM: 139 mmol/L (ref 134–144)
TOTAL PROTEIN: 7 g/dL (ref 6.0–8.5)

## 2017-09-07 LAB — TSH: TSH: 1.15 u[IU]/mL (ref 0.450–4.500)

## 2017-09-07 LAB — CBC WITH DIFFERENTIAL/PLATELET
Basophils Absolute: 0 10*3/uL (ref 0.0–0.2)
Basos: 0 %
EOS (ABSOLUTE): 0.1 10*3/uL (ref 0.0–0.4)
Eos: 2 %
HEMATOCRIT: 36.8 % (ref 34.0–46.6)
HEMOGLOBIN: 12.8 g/dL (ref 11.1–15.9)
IMMATURE GRANS (ABS): 0 10*3/uL (ref 0.0–0.1)
IMMATURE GRANULOCYTES: 0 %
LYMPHS: 39 %
Lymphocytes Absolute: 2.5 10*3/uL (ref 0.7–3.1)
MCH: 30.5 pg (ref 26.6–33.0)
MCHC: 34.8 g/dL (ref 31.5–35.7)
MCV: 88 fL (ref 79–97)
MONOCYTES: 9 %
Monocytes Absolute: 0.5 10*3/uL (ref 0.1–0.9)
NEUTROS PCT: 50 %
Neutrophils Absolute: 3.1 10*3/uL (ref 1.4–7.0)
Platelets: 309 10*3/uL (ref 150–379)
RBC: 4.19 x10E6/uL (ref 3.77–5.28)
RDW: 13.6 % (ref 12.3–15.4)
WBC: 6.3 10*3/uL (ref 3.4–10.8)

## 2017-09-07 LAB — LIPID PANEL W/O CHOL/HDL RATIO
Cholesterol, Total: 174 mg/dL (ref 100–199)
HDL: 59 mg/dL (ref >39)
LDL Calculated: 96 mg/dL (ref 0–99)
TRIGLYCERIDES: 96 mg/dL (ref 0–149)
VLDL Cholesterol Cal: 19 mg/dL (ref 5–40)

## 2017-09-07 LAB — VITAMIN D 25 HYDROXY (VIT D DEFICIENCY, FRACTURES): Vit D, 25-Hydroxy: 42 ng/mL (ref 30.0–100.0)

## 2017-09-07 MED ORDER — NAPROXEN 500 MG PO TABS: 500.0000 mg | ORAL_TABLET | Freq: Two times a day (BID) | ORAL | 0 refills | Status: DC

## 2017-09-07 NOTE — Progress Notes (Signed)
Notified pt by mychart

## 2017-09-08 LAB — IGP, APTIMA HPV, RFX 16/18,45
HPV APTIMA: NEGATIVE
PAP Smear Comment: 0

## 2017-09-08 NOTE — Progress Notes (Signed)
Normal labs.  Pt notified through mychart

## 2017-10-28 ENCOUNTER — Encounter: Payer: Self-pay | Admitting: Unknown Physician Specialty

## 2017-11-30 ENCOUNTER — Ambulatory Visit: Payer: BC Managed Care – PPO | Admitting: Unknown Physician Specialty

## 2017-12-30 ENCOUNTER — Encounter: Payer: Self-pay | Admitting: Unknown Physician Specialty

## 2017-12-31 ENCOUNTER — Encounter: Payer: Self-pay | Admitting: Unknown Physician Specialty

## 2017-12-31 ENCOUNTER — Ambulatory Visit: Payer: BC Managed Care – PPO | Admitting: Unknown Physician Specialty

## 2017-12-31 VITALS — BP 141/84 | HR 87 | Temp 98.1°F | Wt 203.6 lb

## 2017-12-31 DIAGNOSIS — R1030 Lower abdominal pain, unspecified: Secondary | ICD-10-CM | POA: Diagnosis not present

## 2017-12-31 DIAGNOSIS — M545 Low back pain: Secondary | ICD-10-CM | POA: Diagnosis not present

## 2017-12-31 DIAGNOSIS — D72829 Elevated white blood cell count, unspecified: Secondary | ICD-10-CM | POA: Diagnosis not present

## 2017-12-31 LAB — CBC WITH DIFFERENTIAL/PLATELET
Hematocrit: 35.3 % (ref 34.0–46.6)
Hemoglobin: 12.2 g/dL (ref 11.1–15.9)
LYMPHS: 27 %
Lymphocytes Absolute: 2.4 10*3/uL (ref 0.7–3.1)
MCH: 31.6 pg (ref 26.6–33.0)
MCHC: 34.6 g/dL (ref 31.5–35.7)
MCV: 92 fL (ref 79–97)
MID (ABSOLUTE): 0.9 10*3/uL (ref 0.1–1.6)
MID: 11 %
NEUTROS ABS: 5.5 10*3/uL (ref 1.4–7.0)
Neutrophils: 62 %
PLATELETS: 336 10*3/uL (ref 150–379)
RBC: 3.86 x10E6/uL (ref 3.77–5.28)
RDW: 12.7 % (ref 12.3–15.4)
WBC: 8.8 10*3/uL (ref 3.4–10.8)

## 2017-12-31 MED ORDER — SULFAMETHOXAZOLE-TRIMETHOPRIM 800-160 MG PO TABS: 1.0000 | ORAL_TABLET | Freq: Two times a day (BID) | ORAL | 0 refills | Status: DC

## 2017-12-31 NOTE — Addendum Note (Signed)
Addended by: Gabriel CirriWICKER, Willena Jeancharles on: 12/31/2017 05:35 PM   Modules accepted: Orders

## 2017-12-31 NOTE — Progress Notes (Signed)
BP (!) 141/84   Pulse 87   Temp 98.1 F (36.7 C) (Oral)   Wt 203 lb 9.6 oz (92.4 kg)   LMP  (LMP Unknown)   SpO2 97%   BMI 32.37 kg/m    Subjective:    Patient ID: Mary Wood, female    DOB: 1956/01/30, 62 y.o.   MRN: [id number]  HPI: Mary Wood is a 62 y.o. female  Chief Complaint  Patient presents with  . Pain    pt states she has been having lower back and lower abdominal pain/pressure for the past 3 days. States she has tried taking colace and drinking prune juice thinking it was maybe constipation but still had soft but painful BM's. Patient describes pain as a 4 right now, has been up to a 9.    Abdominal Pain  This is a new problem. The onset quality is sudden. The problem occurs constantly. The problem has been unchanged. The pain is located in the suprapubic region. The pain is at a severity of 4/10. The quality of the pain is aching. The abdominal pain radiates to the left flank and right flank. Pertinent negatives include no anorexia, arthralgias, belching, constipation, diarrhea, dysuria, frequency, nausea, vomiting or weight loss. Exacerbated by: Moving around. The pain is relieved by nothing. Treatments tried: NSAIDs/colace.     Relevant past medical, surgical, family and social history reviewed and updated as indicated. Interim medical history since our last visit reviewed. Allergies and medications reviewed and updated.  Review of Systems  Constitutional: Negative for weight loss.  Gastrointestinal: Positive for abdominal pain. Negative for anorexia, constipation, diarrhea, nausea and vomiting.  Genitourinary: Negative for dysuria and frequency.  Musculoskeletal: Negative for arthralgias.    Per HPI unless specifically indicated above     Objective:    BP (!) 141/84   Pulse 87   Temp 98.1 F (36.7 C) (Oral)   Wt 203 lb 9.6 oz (92.4 kg)   LMP  (LMP Unknown)   SpO2 97%   BMI 32.37 kg/m   Wt Readings from Last 3 Encounters:  12/31/17 203 lb 9.6 oz  (92.4 kg)  09/06/17 197 lb 9.6 oz (89.6 kg)  07/29/16 184 lb 9.6 oz (83.7 kg)    Physical Exam  Constitutional: She is oriented to person, place, and time. She appears well-developed and well-nourished. No distress.  HENT:  Head: Normocephalic and atraumatic.  Eyes: Conjunctivae and lids are normal. Right eye exhibits no discharge. Left eye exhibits no discharge. No scleral icterus.  Neck: Normal range of motion. Neck supple. No JVD present. Carotid bruit is not present.  Cardiovascular: Normal rate, regular rhythm and normal heart sounds.  Pulmonary/Chest: Effort normal and breath sounds normal.  Abdominal: Normal appearance. There is no splenomegaly or hepatomegaly.  Genitourinary: Vagina normal and uterus normal. Cervix exhibits no motion tenderness, no discharge and no friability. Right adnexum displays no mass, no tenderness and no fullness. Left adnexum displays no mass, no tenderness and no fullness.  Musculoskeletal: Normal range of motion.  Neurological: She is alert and oriented to person, place, and time.  Skin: Skin is warm, dry and intact. No rash noted. No pallor.  Psychiatric: She has a normal mood and affect. Her behavior is normal. Judgment and thought content normal.   CBC normal Urine positive for leukocytes    Assessment & Plan:   Problem List Items Addressed This Visit    None    Visit Diagnoses    Low back pain, unspecified back  pain laterality, unspecified chronicity, with sciatica presence unspecified    -  Primary   suspect constipation.  ? UTI.  Will treat for both   Relevant Orders   UA/M w/rflx Culture, Routine   CBC With Differential/Platelet   Lower abdominal pain       ? UTI.  Will treat with 3 days of Septra.  Suspect constipation.  Prune juice, MOM, and colace   Relevant Orders   UA/M w/rflx Culture, Routine   CBC With Differential/Platelet   Leukocytosis, unspecified type       Treat for UTI.  Send urine for culture       Follow up  plan: Return if symptoms worsen or fail to improve.

## 2018-01-04 LAB — UA/M W/RFLX CULTURE, ROUTINE
Bilirubin, UA: NEGATIVE
Glucose, UA: NEGATIVE
Ketones, UA: NEGATIVE
Nitrite, UA: NEGATIVE
PH UA: 5.5 (ref 5.0–7.5)
Protein, UA: NEGATIVE
Specific Gravity, UA: 1.01 (ref 1.005–1.030)
Urobilinogen, Ur: 0.2 mg/dL (ref 0.2–1.0)

## 2018-01-04 LAB — URINE CULTURE, REFLEX

## 2018-01-04 LAB — MICROSCOPIC EXAMINATION: RBC, UA: NONE SEEN /hpf (ref 0–?)

## 2018-01-13 ENCOUNTER — Encounter: Payer: Self-pay | Admitting: Unknown Physician Specialty

## 2018-01-13 DIAGNOSIS — K5900 Constipation, unspecified: Secondary | ICD-10-CM

## 2018-02-15 ENCOUNTER — Other Ambulatory Visit: Payer: Self-pay | Admitting: Family Medicine

## 2018-02-15 ENCOUNTER — Encounter: Payer: Self-pay | Admitting: Unknown Physician Specialty

## 2018-02-15 MED ORDER — SULFAMETHOXAZOLE-TRIMETHOPRIM 800-160 MG PO TABS: 1.0000 | ORAL_TABLET | Freq: Two times a day (BID) | ORAL | 0 refills | Status: DC

## 2018-07-14 ENCOUNTER — Other Ambulatory Visit: Payer: Self-pay | Admitting: Unknown Physician Specialty

## 2018-07-14 DIAGNOSIS — Z1231 Encounter for screening mammogram for malignant neoplasm of breast: Secondary | ICD-10-CM

## 2018-10-17 ENCOUNTER — Ambulatory Visit
Admission: RE | Admit: 2018-10-17 | Discharge: 2018-10-17 | Disposition: A | Discharge status: Home / Self Care | Payer: BC Managed Care – PPO | Source: Ambulatory Visit | Attending: Unknown Physician Specialty | Admitting: Unknown Physician Specialty

## 2018-10-17 DIAGNOSIS — Z1231 Encounter for screening mammogram for malignant neoplasm of breast: Secondary | ICD-10-CM | POA: Insufficient documentation

## 2018-12-20 ENCOUNTER — Ambulatory Visit: Payer: BC Managed Care – PPO | Admitting: Nurse Practitioner

## 2018-12-20 ENCOUNTER — Encounter: Payer: Self-pay | Admitting: Nurse Practitioner

## 2018-12-20 VITALS — BP 145/78 | HR 73 | Temp 98.0°F | Ht 66.5 in | Wt 200.2 lb

## 2018-12-20 DIAGNOSIS — M25552 Pain in left hip: Secondary | ICD-10-CM | POA: Diagnosis not present

## 2018-12-20 DIAGNOSIS — M25551 Pain in right hip: Secondary | ICD-10-CM

## 2018-12-20 DIAGNOSIS — M5441 Lumbago with sciatica, right side: Secondary | ICD-10-CM

## 2018-12-20 DIAGNOSIS — M5136 Other intervertebral disc degeneration, lumbar region: Secondary | ICD-10-CM | POA: Insufficient documentation

## 2018-12-20 MED ORDER — PREDNISONE 10 MG PO TABS: ORAL_TABLET | ORAL | 0 refills | Status: DC

## 2018-12-20 MED ORDER — GABAPENTIN 100 MG PO CAPS: 100.0000 mg | ORAL_CAPSULE | Freq: Two times a day (BID) | ORAL | 3 refills | Status: DC

## 2018-12-20 NOTE — Assessment & Plan Note (Addendum)
Lumbar imaging ordered.  Will trial Prednisone taper, educated patient on medication. Consider d/c after returns from vacation and referral to PT if continued discomfort. Continue Tylenol at home as needed, avoid Ibuprofen (HTN).  Return to office for worsening symptoms.

## 2018-12-20 NOTE — Progress Notes (Addendum)
BP (!) 145/78 (BP Location: Left Arm)   Pulse 73   Temp 98 F (36.7 C)   Ht 5' 6.5" (1.689 m)   Wt 200 lb 4 oz (90.8 kg)   LMP  (LMP Unknown)   SpO2 97%   BMI 31.84 kg/m    Subjective:    Patient ID: Mary Wood, female    DOB: [birth year], 63 y.o.   MRN: [id number]  HPI: Mary Wood is a 63 y.o. female presents for hip and back pain  Chief Complaint  Patient presents with  . Back Pain    low back, no known injury  . Hip Pain    bilateral, right mainly   BACK & HIP PAIN Pain in hips has been present for two months (has history of this in 2017, similar and underlying osteopenia is on daily Vit D and calcium), when sitting it becomes worse, but improved with moving.  Laying flat also improves.  Ibuprofen not helping, but Tylenol improves.  Back pain started within the past week.  Mainly to lower back, feels better when laying down and being in hot tub.  Has h/o hip imaging in 2017 which was negative at the time.  Is going on trip soon and concerned about pain.  Discussed various treatments, she would like to try Prednisone taper as she is going on vacation next week. Duration: hip two months and back one week Mechanism of injury: unknown Location: low back Onset: gradual Severity: 10/10 at worst and 2/10 at best Quality: dull and throbbing Frequency: intermittent Radiation: only occasionally radiates with burning down legs, R leg below the knee and L leg below the knee Aggravating factors: prolonged sitting, feels better once moving and has been using treadmill + stretching Alleviating factors: APAP Status: fluctuating Treatments attempted: rest, heat, APAP and ibuprofen  Relief with NSAIDs?: no Nighttime pain:  no Paresthesias / decreased sensation:  no Bowel / bladder incontinence:  no Fevers:  no Dysuria / urinary frequency:  no   Relevant past medical, surgical, family and social history reviewed and updated as indicated. Interim medical history since our last visit  reviewed. Allergies and medications reviewed and updated.  Review of Systems  Constitutional: Negative for activity change, appetite change, diaphoresis, fatigue and fever.  Respiratory: Negative for cough, chest tightness and shortness of breath.   Cardiovascular: Negative for chest pain, palpitations and leg swelling.  Gastrointestinal: Negative for abdominal distention, abdominal pain, constipation, diarrhea, nausea and vomiting.  Endocrine: Negative for cold intolerance, heat intolerance, polydipsia, polyphagia and polyuria.  Genitourinary: Negative for dysuria, frequency, hematuria, urgency and vaginal discharge.  Musculoskeletal: Positive for arthralgias and back pain.  Neurological: Negative for dizziness, syncope, weakness, light-headedness, numbness and headaches.  Psychiatric/Behavioral: Negative.     Per HPI unless specifically indicated above     Objective:    BP (!) 145/78 (BP Location: Left Arm)   Pulse 73   Temp 98 F (36.7 C)   Ht 5' 6.5" (1.689 m)   Wt 200 lb 4 oz (90.8 kg)   LMP  (LMP Unknown)   SpO2 97%   BMI 31.84 kg/m   Wt Readings from Last 3 Encounters:  12/20/18 200 lb 4 oz (90.8 kg)  12/31/17 203 lb 9.6 oz (92.4 kg)  09/06/17 197 lb 9.6 oz (89.6 kg)    Physical Exam Vitals signs and nursing note reviewed.  Constitutional:      General: She is awake.     Appearance: She is well-developed.  HENT:  Head: Normocephalic.     Right Ear: Hearing normal.     Left Ear: Hearing normal.     Nose: Nose normal.     Mouth/Throat:     Mouth: Mucous membranes are moist.  Eyes:     General: Lids are normal.        Right eye: No discharge.        Left eye: No discharge.     Conjunctiva/sclera: Conjunctivae normal.     Pupils: Pupils are equal, round, and reactive to light.  Neck:     Musculoskeletal: Normal range of motion and neck supple.     Thyroid: No thyromegaly.     Vascular: No carotid bruit or JVD.  Cardiovascular:     Rate and Rhythm:  Normal rate and regular rhythm.     Heart sounds: Normal heart sounds.  Pulmonary:     Effort: Pulmonary effort is normal.     Breath sounds: Normal breath sounds.  Abdominal:     General: Bowel sounds are normal.     Palpations: Abdomen is soft. There is no hepatomegaly or splenomegaly.  Musculoskeletal:     Right hip: She exhibits normal range of motion, normal strength, no tenderness, no swelling and no crepitus.     Left hip: She exhibits normal range of motion, normal strength, no tenderness, no swelling and no crepitus.     Lumbar back: She exhibits tenderness (over SI area bilaterally). She exhibits normal range of motion, no swelling, no edema and no spasm.     Comments: No rashes noted.  No spinal tenderness, mild tenderness over bilateral SI joints.  Full ROM back, extension/flexion/rotating/and lateral bend without report discomfort (pressure and pull only).  Negative straight leg test.    Lymphadenopathy:     Cervical: No cervical adenopathy.  Skin:    General: Skin is warm and dry.  Neurological:     Mental Status: She is alert and oriented to person, place, and time.     Cranial Nerves: Cranial nerves are intact.     Sensory: Sensation is intact.     Motor: Motor function is intact.     Gait: Gait is intact.     Deep Tendon Reflexes:     Reflex Scores:      Patellar reflexes are 2+ on the right side and 2+ on the left side.      Achilles reflexes are 1+ on the right side and 1+ on the left side. Psychiatric:        Attention and Perception: Attention normal.        Mood and Affect: Mood normal.        Behavior: Behavior normal. Behavior is cooperative.        Thought Content: Thought content normal.        Judgment: Judgment normal.      Results for orders placed or performed in visit on 12/31/17  Microscopic Examination  Result Value Ref Range   WBC, UA 6-10 (A) 0 - 5 /hpf   RBC, UA None seen 0 - 2 /hpf   Epithelial Cells (non renal) 0-10 0 - 10 /hpf   Renal  Epithel, UA 0-10 (A) None seen /hpf   Bacteria, UA Few None seen/Few  Urine Culture, Reflex  Result Value Ref Range   Urine Culture, Routine Final report (A)    Organism ID, Bacteria Comment (A)    Antimicrobial Susceptibility Comment   UA/M w/rflx Culture, Routine  Result Value Ref Range  Specific Gravity, UA 1.010 1.005 - 1.030   pH, UA 5.5 5.0 - 7.5   Color, UA Yellow Yellow   Appearance Ur Cloudy (A) Clear   Leukocytes, UA 1+ (A) Negative   Protein, UA Negative Negative/Trace   Glucose, UA Negative Negative   Ketones, UA Negative Negative   RBC, UA Trace (A) Negative   Bilirubin, UA Negative Negative   Urobilinogen, Ur 0.2 0.2 - 1.0 mg/dL   Nitrite, UA Negative Negative   Microscopic Examination See below:    Urinalysis Reflex Comment   CBC With Differential/Platelet  Result Value Ref Range   WBC 8.8 3.4 - 10.8 x10E3/uL   RBC 3.86 3.77 - 5.28 x10E6/uL   Hemoglobin 12.2 11.1 - 15.9 g/dL   Hematocrit 16.135.3 09.634.0 - 46.6 %   MCV 92 79 - 97 fL   MCH 31.6 26.6 - 33.0 pg   MCHC 34.6 31.5 - 35.7 g/dL   RDW 04.512.7 40.912.3 - 81.115.4 %   Platelets 336 150 - 379 x10E3/uL   Neutrophils 62 Not Estab. %   Lymphs 27 Not Estab. %   MID 11 Not Estab. %   Neutrophils Absolute 5.5 1.4 - 7.0 x10E3/uL   Lymphocytes Absolute 2.4 0.7 - 3.1 x10E3/uL   MID (Absolute) 0.9 0.1 - 1.6 X10E3/uL      Assessment & Plan:   Problem List Items Addressed This Visit      Other   Hip pain, bilateral - Primary    Imaging ordered.  Continue Tylenol.  Refer to PT if continued discomfort at next appointment.  Return for worsening symptoms.      Relevant Orders   DG HIPS BILAT WITH PELVIS 2V   Acute midline low back pain with right-sided sciatica    Lumbar imaging ordered.  Will trial Prednisone taper, educated patient on medication. Consider d/c after returns from vacation and referral to PT if continued discomfort. Continue Tylenol at home as needed, avoid Ibuprofen (HTN).  Return to office for worsening  symptoms.      Relevant Medications   predniSONE (DELTASONE) 10 MG tablet   Other Relevant Orders   DG Lumbar Spine Complete       Follow up plan: Return if symptoms worsen or fail to improve, for has scheduled appt on 27th.

## 2018-12-20 NOTE — Assessment & Plan Note (Signed)
Imaging ordered.  Continue Tylenol.  Refer to PT if continued discomfort at next appointment.  Return for worsening symptoms.

## 2018-12-20 NOTE — Addendum Note (Signed)
Addended by: Aura Dials T on: 12/20/2018 05:58 PM   Modules accepted: Orders

## 2018-12-20 NOTE — Patient Instructions (Signed)
Acute Back Pain, Adult  Acute back pain is sudden and usually short-lived. It is often caused by an injury to the muscles and tissues in the back. The injury may result from:   A muscle or ligament getting overstretched or torn (strained). Ligaments are tissues that connect bones to each other. Lifting something improperly can cause a back strain.   Wear and tear (degeneration) of the spinal disks. Spinal disks are circular tissue that provides cushioning between the bones of the spine (vertebrae).   Twisting motions, such as while playing sports or doing yard work.   A hit to the back.   Arthritis.  You may have a physical exam, lab tests, and imaging tests to find the cause of your pain. Acute back pain usually goes away with rest and home care.  Follow these instructions at home:  Managing pain, stiffness, and swelling   Take over-the-counter and prescription medicines only as told by your health care provider.   Your health care provider may recommend applying ice during the first 24-48 hours after your pain starts. To do this:  ? Put ice in a plastic bag.  ? Place a towel between your skin and the bag.  ? Leave the ice on for 20 minutes, 2-3 times a day.   If directed, apply heat to the affected area as often as told by your health care provider. Use the heat source that your health care provider recommends, such as a moist heat pack or a heating pad.  ? Place a towel between your skin and the heat source.  ? Leave the heat on for 20-30 minutes.  ? Remove the heat if your skin turns bright red. This is especially important if you are unable to feel pain, heat, or cold. You have a greater risk of getting burned.  Activity     Do not stay in bed. Staying in bed for more than 1-2 days can delay your recovery.   Sit up and stand up straight. Avoid leaning forward when you sit, or hunching over when you stand.  ? If you work at a desk, sit close to it so you do not need to lean over. Keep your chin tucked  in. Keep your neck drawn back, and keep your elbows bent at a right angle. Your arms should look like the letter "L."  ? Sit high and close to the steering wheel when you drive. Add lower back (lumbar) support to your car seat, if needed.   Take short walks on even surfaces as soon as you are able. Try to increase the length of time you walk each day.   Do not sit, drive, or stand in one place for more than 30 minutes at a time. Sitting or standing for long periods of time can put stress on your back.   Do not drive or use heavy machinery while taking prescription pain medicine.   Use proper lifting techniques. When you bend and lift, use positions that put less stress on your back:  ? Bend your knees.  ? Keep the load close to your body.  ? Avoid twisting.   Exercise regularly as told by your health care provider. Exercising helps your back heal faster and helps prevent back injuries by keeping muscles strong and flexible.   Work with a physical therapist to make a safe exercise program, as recommended by your health care provider. Do any exercises as told by your physical therapist.  Lifestyle   Maintain   a healthy weight. Extra weight puts stress on your back and makes it difficult to have good posture.   Avoid activities or situations that make you feel anxious or stressed. Stress and anxiety increase muscle tension and can make back pain worse. Learn ways to manage anxiety and stress, such as through exercise.  General instructions   Sleep on a firm mattress in a comfortable position. Try lying on your side with your knees slightly bent. If you lie on your back, put a pillow under your knees.   Follow your treatment plan as told by your health care provider. This may include:  ? Cognitive or behavioral therapy.  ? Acupuncture or massage therapy.  ? Meditation or yoga.  Contact a health care provider if:   You have pain that is not relieved with rest or medicine.   You have increasing pain going down  into your legs or buttocks.   Your pain does not improve after 2 weeks.   You have pain at night.   You lose weight without trying.   You have a fever or chills.  Get help right away if:   You develop new bowel or bladder control problems.   You have unusual weakness or numbness in your arms or legs.   You develop nausea or vomiting.   You develop abdominal pain.   You feel faint.  Summary   Acute back pain is sudden and usually short-lived.   Use proper lifting techniques. When you bend and lift, use positions that put less stress on your back.   Take over-the-counter and prescription medicines and apply heat or ice as directed by your health care provider.  This information is not intended to replace advice given to you by your health care provider. Make sure you discuss any questions you have with your health care provider.  Document Released: 11/30/2005 Document Revised: 07/07/2018 Document Reviewed: 07/14/2017  Elsevier Interactive Patient Education  2019 [employer]

## 2018-12-21 ENCOUNTER — Ambulatory Visit
Admission: RE | Admit: 2018-12-21 | Discharge: 2018-12-21 | Disposition: A | Discharge status: Home / Self Care | Payer: BC Managed Care – PPO | Source: Ambulatory Visit | Attending: Nurse Practitioner | Admitting: Nurse Practitioner

## 2018-12-21 ENCOUNTER — Other Ambulatory Visit: Payer: Self-pay | Admitting: Nurse Practitioner

## 2018-12-21 DIAGNOSIS — M5441 Lumbago with sciatica, right side: Secondary | ICD-10-CM

## 2018-12-21 DIAGNOSIS — M25551 Pain in right hip: Secondary | ICD-10-CM

## 2018-12-21 DIAGNOSIS — M25552 Pain in left hip: Secondary | ICD-10-CM

## 2018-12-28 ENCOUNTER — Encounter: Payer: Self-pay | Admitting: Nurse Practitioner

## 2018-12-28 ENCOUNTER — Ambulatory Visit (INDEPENDENT_AMBULATORY_CARE_PROVIDER_SITE_OTHER): Payer: BC Managed Care – PPO | Admitting: Nurse Practitioner

## 2018-12-28 ENCOUNTER — Other Ambulatory Visit: Payer: Self-pay

## 2018-12-28 DIAGNOSIS — B029 Zoster without complications: Secondary | ICD-10-CM | POA: Diagnosis not present

## 2018-12-28 MED ORDER — VALACYCLOVIR HCL 1 G PO TABS: 1000.0000 mg | ORAL_TABLET | Freq: Three times a day (TID) | ORAL | 0 refills | Status: AC

## 2018-12-28 MED ORDER — TRIAMCINOLONE ACETONIDE 0.1 % EX CREA: 1.0000 "application " | TOPICAL_CREAM | CUTANEOUS | 0 refills | Status: DC | PRN

## 2018-12-28 NOTE — Assessment & Plan Note (Signed)
Initial presentation > 72 hours and has upcoming trip.  Valtrex TID script sent and steroid cream.  Discussed precautions to take.  Encouraged no working at this time, as she has direct patient contact.  Has physical exam in two weeks for follow-up.

## 2018-12-28 NOTE — Patient Instructions (Signed)
Shingles    Shingles is an infection. It gives you a painful skin rash and blisters that have fluid in them. Shingles is caused by the same germ (virus) that causes chickenpox.  Shingles only happens in people who:   Have had chickenpox.   Have been given a shot of medicine (vaccine) to protect against chickenpox. Shingles is rare in this group.  The first symptoms of shingles may be itching, tingling, or pain in an area on your skin. A rash will show on your skin a few days or weeks later. The rash is likely to be on one side of your body. The rash usually has a shape like a belt or a band. Over time, the rash turns into fluid-filled blisters. The blisters will break open, change into scabs, and dry up. Medicines may:   Help with pain and itching.   Help you get better sooner.   Help to prevent long-term problems.  Follow these instructions at home:  Medicines   Take over-the-counter and prescription medicines only as told by your doctor.   Put on an anti-itch cream or numbing cream where you have a rash, blisters, or scabs. Do this as told by your doctor.  Helping with itching and discomfort     Put cold, wet cloths (cold compresses) on the area of the rash or blisters as told by your doctor.   Cool baths can help you feel better. Try adding baking soda or dry oatmeal to the water to lessen itching. Do not bathe in hot water.  Blister and rash care   Keep your rash covered with a loose bandage (dressing).   Wear loose clothing that does not rub on your rash.   Keep your rash and blisters clean. To do this, wash the area with mild soap and cool water as told by your doctor.   Check your rash every day for signs of infection. Check for:  ? More redness, swelling, or pain.  ? Fluid or blood.  ? Warmth.  ? Pus or a bad smell.   Do not scratch your rash. Do not pick at your blisters. To help you to not scratch:  ? Keep your fingernails clean and cut short.  ? Wear gloves or mittens when you sleep, if  scratching is a problem.  General instructions   Rest as told by your doctor.   Keep all follow-up visits as told by your doctor. This is important.   Wash your hands often with soap and water. If soap and water are not available, use hand sanitizer. Doing this lowers your chance of getting a skin infection caused by germs (bacteria).   Your infection can cause chickenpox in people who have never had chickenpox or never got a shot of chickenpox vaccine. If you have blisters that did not change into scabs yet, try not to touch other people or be around other people, especially:  ? Babies.  ? Pregnant women.  ? Children who have areas of red, itchy, or rough skin (eczema).  ? Very old people who have transplants.  ? People who have a long-term (chronic) sickness, like cancer or AIDS.  Contact a doctor if:   Your pain does not get better with medicine.   Your pain does not get better after the rash heals.   You have any signs of infection in the rash area. These signs include:  ? More redness, swelling, or pain around the rash.  ? Fluid or blood coming from   the rash.  ? The rash area feeling warm to the touch.  ? Pus or a bad smell coming from the rash.  Get help right away if:   The rash is on your face or nose.   You have pain in your face or pain by your eye.   You lose feeling on one side of your face.   You have trouble seeing.   You have ear pain, or you have ringing in your ear.   You have a loss of taste.   Your condition gets worse.  Summary   Shingles gives you a painful skin rash and blisters that have fluid in them.   Shingles is an infection. It is caused by the same germ (virus) that causes chickenpox.   Keep your rash covered with a loose bandage (dressing). Wear loose clothing that does not rub on your rash.   If you have blisters that did not change into scabs yet, try not to touch other people or be around people.  This information is not intended to replace advice given to you by  your health care provider. Make sure you discuss any questions you have with your health care provider.  Document Released: 05/18/2008 Document Revised: 08/04/2017 Document Reviewed: 08/04/2017  Elsevier Interactive Patient Education  2019 [employer]

## 2018-12-28 NOTE — Progress Notes (Signed)
BP (!) 162/97   Pulse 73   Temp 98 F (36.7 C) (Oral)   Ht 5\' 7"  (1.702 m)   Wt 200 lb (90.7 kg)   LMP  (LMP Unknown)   SpO2 96%   BMI 31.32 kg/m    Subjective:    Patient ID: Mary Wood, female    DOB: 08-Jul-1956, 63 y.o.   MRN: [id number]  HPI: Mary Wood is a 63 y.o. female presents for rash assessment  Chief Complaint  Patient presents with  . Rash    since last Saturday. right lower abdomen, painful at times. has tried neosporin   RASH Initial presentation on Saturday. Duration:  days  Location: trunk Itching: no Burning: no burning, but occasional small twinges Redness: yes Oozing: no Scaling: no Blisters: yes Painful: no Fevers: no Change in detergents/soaps/personal care products: no Recent illness: no Recent travel:no History of same: no Context: stable Alleviating factors: lotion/moisturizer Treatments attempted:lotion/moisturizer Shortness of breath: no  Throat/tongue swelling: no Myalgias/arthralgias: no  Relevant past medical, surgical, family and social history reviewed and updated as indicated. Interim medical history since our last visit reviewed. Allergies and medications reviewed and updated.  Review of Systems  Constitutional: Negative for activity change, appetite change, diaphoresis, fatigue and fever.  Respiratory: Negative for cough, chest tightness and shortness of breath.   Cardiovascular: Negative for chest pain, palpitations and leg swelling.  Gastrointestinal: Negative for abdominal distention, abdominal pain, constipation, diarrhea, nausea and vomiting.  Endocrine: Negative for cold intolerance, heat intolerance, polydipsia, polyphagia and polyuria.  Skin: Positive for rash.  Neurological: Negative for dizziness, syncope, weakness, light-headedness, numbness and headaches.  Psychiatric/Behavioral: Negative.     Per HPI unless specifically indicated above     Objective:    BP (!) 162/97   Pulse 73   Temp 98 F (36.7  C) (Oral)   Ht 5\' 7"  (1.702 m)   Wt 200 lb (90.7 kg)   LMP  (LMP Unknown)   SpO2 96%   BMI 31.32 kg/m   Wt Readings from Last 3 Encounters:  12/28/18 200 lb (90.7 kg)  12/20/18 200 lb 4 oz (90.8 kg)  12/31/17 203 lb 9.6 oz (92.4 kg)    Physical Exam Vitals signs and nursing note reviewed.  Constitutional:      General: She is awake.     Appearance: She is well-developed.  HENT:     Head: Normocephalic.     Right Ear: Hearing normal.     Left Ear: Hearing normal.     Nose: Nose normal.     Mouth/Throat:     Mouth: Mucous membranes are moist.  Eyes:     General: Lids are normal.        Right eye: No discharge.        Left eye: No discharge.     Conjunctiva/sclera: Conjunctivae normal.     Pupils: Pupils are equal, round, and reactive to light.  Neck:     Musculoskeletal: Normal range of motion and neck supple.     Thyroid: No thyromegaly.     Vascular: No carotid bruit or JVD.  Cardiovascular:     Rate and Rhythm: Normal rate and regular rhythm.     Heart sounds: Normal heart sounds. No murmur. No gallop.   Pulmonary:     Effort: Pulmonary effort is normal.     Breath sounds: Normal breath sounds.  Abdominal:     General: Bowel sounds are normal.     Palpations: Abdomen is soft.  There is no hepatomegaly or splenomegaly.  Musculoskeletal:     Right lower leg: No edema.     Left lower leg: No edema.  Lymphadenopathy:     Cervical: No cervical adenopathy.  Skin:    General: Skin is warm and dry.     Findings: Rash present. Rash is vesicular.     Comments: Small clustered rash to lower right abdomen with erythema and scattered vesicles, majority with initial crusting, and small cluster to right lower back with no vesicles.  No tenderness or drainage.  Neurological:     Mental Status: She is alert and oriented to person, place, and time.  Psychiatric:        Attention and Perception: Attention normal.        Mood and Affect: Mood normal.        Behavior: Behavior  normal. Behavior is cooperative.        Thought Content: Thought content normal.        Judgment: Judgment normal.     Results for orders placed or performed in visit on 12/31/17  Microscopic Examination  Result Value Ref Range   WBC, UA 6-10 (A) 0 - 5 /hpf   RBC, UA None seen 0 - 2 /hpf   Epithelial Cells (non renal) 0-10 0 - 10 /hpf   Renal Epithel, UA 0-10 (A) None seen /hpf   Bacteria, UA Few None seen/Few  Urine Culture, Reflex  Result Value Ref Range   Urine Culture, Routine Final report (A)    Organism ID, Bacteria Comment (A)    Antimicrobial Susceptibility Comment   UA/M w/rflx Culture, Routine  Result Value Ref Range   Specific Gravity, UA 1.010 1.005 - 1.030   pH, UA 5.5 5.0 - 7.5   Color, UA Yellow Yellow   Appearance Ur Cloudy (A) Clear   Leukocytes, UA 1+ (A) Negative   Protein, UA Negative Negative/Trace   Glucose, UA Negative Negative   Ketones, UA Negative Negative   RBC, UA Trace (A) Negative   Bilirubin, UA Negative Negative   Urobilinogen, Ur 0.2 0.2 - 1.0 mg/dL   Nitrite, UA Negative Negative   Microscopic Examination See below:    Urinalysis Reflex Comment   CBC With Differential/Platelet  Result Value Ref Range   WBC 8.8 3.4 - 10.8 x10E3/uL   RBC 3.86 3.77 - 5.28 x10E6/uL   Hemoglobin 12.2 11.1 - 15.9 g/dL   Hematocrit 35.3 29.9 - 46.6 %   MCV 92 79 - 97 fL   MCH 31.6 26.6 - 33.0 pg   MCHC 34.6 31.5 - 35.7 g/dL   RDW 24.2 68.3 - 41.9 %   Platelets 336 150 - 379 x10E3/uL   Neutrophils 62 Not Estab. %   Lymphs 27 Not Estab. %   MID 11 Not Estab. %   Neutrophils Absolute 5.5 1.4 - 7.0 x10E3/uL   Lymphocytes Absolute 2.4 0.7 - 3.1 x10E3/uL   MID (Absolute) 0.9 0.1 - 1.6 X10E3/uL      Assessment & Plan:   Problem List Items Addressed This Visit      Other   Shingles outbreak    Initial presentation > 72 hours and has upcoming trip.  Valtrex TID script sent and steroid cream.  Discussed precautions to take.  Encouraged no working at this  time, as she has direct patient contact.  Has physical exam in two weeks for follow-up.      Relevant Medications   valACYclovir (VALTREX) 1000 MG tablet  Follow up plan: Return if symptoms worsen or fail to improve.

## 2018-12-29 ENCOUNTER — Ambulatory Visit: Payer: BC Managed Care – PPO | Admitting: Nurse Practitioner

## 2019-01-09 ENCOUNTER — Other Ambulatory Visit: Payer: Self-pay

## 2019-01-09 ENCOUNTER — Encounter: Payer: Self-pay | Admitting: Nurse Practitioner

## 2019-01-09 ENCOUNTER — Ambulatory Visit (INDEPENDENT_AMBULATORY_CARE_PROVIDER_SITE_OTHER): Payer: BC Managed Care – PPO | Admitting: Nurse Practitioner

## 2019-01-09 VITALS — BP 144/96 | HR 72 | Temp 97.8°F | Ht 66.0 in | Wt 200.0 lb

## 2019-01-09 DIAGNOSIS — E6609 Other obesity due to excess calories: Secondary | ICD-10-CM | POA: Diagnosis not present

## 2019-01-09 DIAGNOSIS — M5136 Other intervertebral disc degeneration, lumbar region: Secondary | ICD-10-CM | POA: Diagnosis not present

## 2019-01-09 DIAGNOSIS — M8580 Other specified disorders of bone density and structure, unspecified site: Secondary | ICD-10-CM

## 2019-01-09 DIAGNOSIS — I10 Essential (primary) hypertension: Secondary | ICD-10-CM | POA: Diagnosis not present

## 2019-01-09 DIAGNOSIS — Z13220 Encounter for screening for lipoid disorders: Secondary | ICD-10-CM

## 2019-01-09 DIAGNOSIS — Z1329 Encounter for screening for other suspected endocrine disorder: Secondary | ICD-10-CM

## 2019-01-09 DIAGNOSIS — Z0000 Encounter for general adult medical examination without abnormal findings: Secondary | ICD-10-CM | POA: Diagnosis not present

## 2019-01-09 DIAGNOSIS — Z6833 Body mass index (BMI) 33.0-33.9, adult: Secondary | ICD-10-CM

## 2019-01-09 NOTE — Assessment & Plan Note (Signed)
Continue Vit D and Calcium.  DEXA next in 2021.

## 2019-01-09 NOTE — Assessment & Plan Note (Addendum)
Chronic, not at goal.  Repeat improved, but continues to not be at goal.  Refuses medication at this time.  Wishes to focus on diet and exercise.  Will take BP at home three days a week and return in 3 months with readings.  Discussed Lisinopril and Losartan as options for future treatment.  Educated on DASH diet.  CMP and CBC today.

## 2019-01-09 NOTE — Progress Notes (Signed)
BP (!) 144/96 (BP Location: Left Arm, Patient Position: Sitting)   Pulse 72   Temp 97.8 F (36.6 C) (Oral)   Ht 5\' 6"  (1.676 m)   Wt 200 lb (90.7 kg)   LMP  (LMP Unknown)   SpO2 96%   BMI 32.28 kg/m    Subjective:    Patient ID: Mary Wood, female    DOB: Jul 14, 1956, 63 y.o.   MRN: [id number]  HPI: Mary Wood is a 63 y.o. female presenting on 01/09/2019 for comprehensive medical examination. Current medical complaints include:none  She currently lives with: husband Menopausal Symptoms: no   HYPERTENSION Does not wish to start medication, wishes to focus on diet and exercise at this time.  Recently came back from cruise.  Will check BP at home three mornings a week and bring to next visit. Hypertension status: stable  Satisfied with current treatment? No current medications, declines at this time Duration of hypertension: years BP monitoring frequency:  not checking BP range:  BP medication side effects:  no Aspirin: no Recurrent headaches: no Visual changes: no Palpitations: no Dyspnea: no Chest pain: no Lower extremity edema: no Dizzy/lightheaded: no   LOWER BACK PAIN: Recently discussed degenerative disc disease to lower back.  She wishes to attend one PT visit and will determine from there if she wishes to continue therapy or exercise/strech at home.  Does not wish medication at this time.  Also has underlying osteopenia, with DEXA due next year.  Depression Screen done today and results listed below:  Depression screen [facility] 2/9 01/09/2019 10/02/2015  Decreased Interest 0 1  Down, Depressed, Hopeless 0 1  PHQ - 2 Score 0 2  Altered sleeping 0 1  Tired, decreased energy 0 1  Change in appetite 0 0  Feeling bad or failure about yourself  0 0  Trouble concentrating 0 0  Moving slowly or fidgety/restless 0 0  Suicidal thoughts 0 0  PHQ-9 Score 0 4  Difficult doing work/chores Not difficult at all -   GAD 7 : Generalized Anxiety Score 01/09/2019  Nervous,  Anxious, on Edge 0  Control/stop worrying 0  Worry too much - different things 0  Trouble relaxing 0  Restless 0  Easily annoyed or irritable 0  Afraid - awful might happen 0  Total GAD 7 Score 0  Anxiety Difficulty Not difficult at all    Functional Status Survey: Is the patient deaf or have difficulty hearing?: No Does the patient have difficulty seeing, even when wearing glasses/contacts?: No Does the patient have difficulty concentrating, remembering, or making decisions?: No Does the patient have difficulty walking or climbing stairs?: No Does the patient have difficulty dressing or bathing?: No Does the patient have difficulty doing errands alone such as visiting a doctor's office or shopping?: No  The patient does not have a history of falls. I did not complete a risk assessment for falls. A plan of care for falls was not documented.   Past Medical History:  Past Medical History:  Diagnosis Date  . Osteoporosis     Surgical History:  Past Surgical History:  Procedure Laterality Date  . GANGLION CYST EXCISION Left 1994  . JOINT REPLACEMENT Left 07/05/2011   elbow    Medications:  Current Outpatient Medications on File Prior to Visit  Medication Sig  . Calcium Citrate-Vitamin D (CALCIUM + D PO) Take 500 mg by mouth daily.  . cholecalciferol (VITAMIN D) 1000 UNITS tablet Take 1,000 Units by mouth daily.  Marland Kitchen ibuprofen (  ADVIL,MOTRIN) 800 MG tablet Take 800 mg by mouth every 6 (six) hours as needed.  . Misc Natural Products (GLUCOSAMINE CHONDROITIN ADV PO) Take by mouth.  . naproxen (NAPROSYN) 500 MG tablet Take 1 tablet (500 mg total) by mouth 2 (two) times daily with a meal.   No current facility-administered medications on file prior to visit.     Allergies:  Allergies  Allergen Reactions  . Cat Hair Extract     Stuffy nose itchy eyes    Social History:  Social History   Socioeconomic History  . Marital status: Married    Spouse name: Not on file  .  Number of children: Not on file  . Years of education: Not on file  . Highest education level: Not on file  Occupational History  . Not on file  Social Needs  . Financial resource strain: Not on file  . Food insecurity:    Worry: Not on file    Inability: Not on file  . Transportation needs:    Medical: Not on file    Non-medical: Not on file  Tobacco Use  . Smoking status: Never Smoker  . Smokeless tobacco: Never Used  Substance and Sexual Activity  . Alcohol use: Yes    Alcohol/week: 0.0 standard drinks    Comment: on occasion  . Drug use: No  . Sexual activity: Yes  Lifestyle  . Physical activity:    Days per week: Not on file    Minutes per session: Not on file  . Stress: Not on file  Relationships  . Social connections:    Talks on phone: Not on file    Gets together: Not on file    Attends religious service: Not on file    Active member of club or organization: Not on file    Attends meetings of clubs or organizations: Not on file    Relationship status: Not on file  . Intimate partner violence:    Fear of current or ex partner: Not on file    Emotionally abused: Not on file    Physically abused: Not on file    Forced sexual activity: Not on file  Other Topics Concern  . Not on file  Social History Narrative  . Not on file   Social History   Tobacco Use  Smoking Status Never Smoker  Smokeless Tobacco Never Used   Social History   Substance and Sexual Activity  Alcohol Use Yes  . Alcohol/week: 0.0 standard drinks   Comment: on occasion    Family History:  Family History  Problem Relation Age of Onset  . Heart disease Mother   . Hypertension Mother   . Depression Mother   . Alcohol abuse Father   . COPD Father   . Alcohol abuse Brother   . Cancer Brother        colon  . Hyperthyroidism Sister   . Hypertension Sister   . Cancer Maternal Grandmother   . Heart disease Maternal Grandfather   . Hypertension Brother   . Depression Brother   .  Osteoporosis Brother     Past medical history, surgical history, medications, allergies, family history and social history reviewed with patient today and changes made to appropriate areas of the chart.   Review of Systems - NEGATIVE All other ROS negative except what is listed above and in the HPI.      Objective:    BP (!) 144/96 (BP Location: Left Arm, Patient Position: Sitting)  Pulse 72   Temp 97.8 F (36.6 C) (Oral)   Ht 5\' 6"  (1.676 m)   Wt 200 lb (90.7 kg)   LMP  (LMP Unknown)   SpO2 96%   BMI 32.28 kg/m   Wt Readings from Last 3 Encounters:  01/09/19 200 lb (90.7 kg)  12/28/18 200 lb (90.7 kg)  12/20/18 200 lb 4 oz (90.8 kg)    Physical Exam Constitutional:      Appearance: She is well-developed.  HENT:     Head: Normocephalic and atraumatic.     Right Ear: Hearing, tympanic membrane, ear canal and external ear normal.     Left Ear: Hearing, tympanic membrane, ear canal and external ear normal.     Nose: Nose normal.     Right Sinus: No maxillary sinus tenderness or frontal sinus tenderness.     Left Sinus: No maxillary sinus tenderness or frontal sinus tenderness.  Eyes:     General:        Right eye: No discharge.        Left eye: No discharge.     Conjunctiva/sclera: Conjunctivae normal.     Pupils: Pupils are equal, round, and reactive to light.  Neck:     Musculoskeletal: Normal range of motion and neck supple.     Thyroid: No thyromegaly.     Vascular: No carotid bruit or JVD.  Cardiovascular:     Rate and Rhythm: Normal rate and regular rhythm.     Heart sounds: Normal heart sounds.  Pulmonary:     Effort: Pulmonary effort is normal.     Breath sounds: Normal breath sounds.  Abdominal:     General: Bowel sounds are normal.     Palpations: Abdomen is soft. There is no hepatomegaly or splenomegaly.  Musculoskeletal: Normal range of motion.     Right lower leg: No edema.     Left lower leg: No edema.  Lymphadenopathy:     Cervical: No cervical  adenopathy.  Skin:    General: Skin is warm and dry.  Neurological:     Mental Status: She is alert and oriented to person, place, and time.     Deep Tendon Reflexes: Reflexes are normal and symmetric.     Reflex Scores:      Brachioradialis reflexes are 2+ on the right side and 2+ on the left side.      Patellar reflexes are 2+ on the right side and 2+ on the left side. Psychiatric:        Mood and Affect: Mood normal.        Behavior: Behavior normal.        Thought Content: Thought content normal.        Judgment: Judgment normal.     Results for orders placed or performed in visit on 12/31/17  Microscopic Examination  Result Value Ref Range   WBC, UA 6-10 (A) 0 - 5 /hpf   RBC, UA None seen 0 - 2 /hpf   Epithelial Cells (non renal) 0-10 0 - 10 /hpf   Renal Epithel, UA 0-10 (A) None seen /hpf   Bacteria, UA Few None seen/Few  Urine Culture, Reflex  Result Value Ref Range   Urine Culture, Routine Final report (A)    Organism ID, Bacteria Comment (A)    Antimicrobial Susceptibility Comment   UA/M w/rflx Culture, Routine  Result Value Ref Range   Specific Gravity, UA 1.010 1.005 - 1.030   pH, UA 5.5 5.0 - 7.5  Color, UA Yellow Yellow   Appearance Ur Cloudy (A) Clear   Leukocytes, UA 1+ (A) Negative   Protein, UA Negative Negative/Trace   Glucose, UA Negative Negative   Ketones, UA Negative Negative   RBC, UA Trace (A) Negative   Bilirubin, UA Negative Negative   Urobilinogen, Ur 0.2 0.2 - 1.0 mg/dL   Nitrite, UA Negative Negative   Microscopic Examination See below:    Urinalysis Reflex Comment   CBC With Differential/Platelet  Result Value Ref Range   WBC 8.8 3.4 - 10.8 x10E3/uL   RBC 3.86 3.77 - 5.28 x10E6/uL   Hemoglobin 12.2 11.1 - 15.9 g/dL   Hematocrit 96.035.3 45.434.0 - 46.6 %   MCV 92 79 - 97 fL   MCH 31.6 26.6 - 33.0 pg   MCHC 34.6 31.5 - 35.7 g/dL   RDW 09.812.7 11.912.3 - 14.715.4 %   Platelets 336 150 - 379 x10E3/uL   Neutrophils 62 Not Estab. %   Lymphs 27 Not  Estab. %   MID 11 Not Estab. %   Neutrophils Absolute 5.5 1.4 - 7.0 x10E3/uL   Lymphocytes Absolute 2.4 0.7 - 3.1 x10E3/uL   MID (Absolute) 0.9 0.1 - 1.6 X10E3/uL      Assessment & Plan:   Problem List Items Addressed This Visit      Cardiovascular and Mediastinum   Hypertension    Chronic, not at goal.  Repeat improved, but continues to not be at goal.  Refuses medication at this time.  Wishes to focus on diet and exercise.  Will take BP at home three days a week and return in 3 months with readings.  Discussed Lisinopril and Losartan as options for future treatment.  Educated on DASH diet.  CMP and CBC today.      Relevant Orders   CBC with Differential/Platelet   Comprehensive metabolic panel     Musculoskeletal and Integument   Osteopenia    Continue Vit D and Calcium.  DEXA next in 2021.      Relevant Orders   VITAMIN D 25 Hydroxy (Vit-D Deficiency, Fractures)   Degenerative disc disease, lumbar    Referral to PT for strengthening exercises.      Relevant Orders   Ambulatory referral to Physical Therapy     Other   Obesity    Focus on healthy diet at home and regular exercise.       Other Visit Diagnoses    Encounter for annual physical exam    -  Primary   Screening cholesterol level       Lipid panel today.   Relevant Orders   Lipid Panel w/o Chol/HDL Ratio   Thyroid disorder screen       TSH today   Relevant Orders   TSH       Follow up plan: Return in about 3 months (around 04/10/2019) for HTN.   LABORATORY TESTING:  - Pap smear: up to date, last in 2018 with HPV = due next 2023  IMMUNIZATIONS:   - Tdap: Tetanus vaccination status reviewed: Up To Date - Influenza: Up to date - Pneumovax: Up to date - Prevnar: Up to date - HPV: N/A - Zostavax vaccine: Up to date  SCREENING: -Mammogram: Up to date  - Colonoscopy: Up to date , done in April 2019 = due next 2029 - Bone Density: Up to date , osteopenia noted 2016 = due next 2021 -Hearing  Test: Not applicable  -Spirometry: Not applicable   PATIENT COUNSELING:  Advised to take 1 mg of folate supplement per day if capable of pregnancy.   Sexuality: Discussed sexually transmitted diseases, partner selection, use of condoms, avoidance of unintended pregnancy  and contraceptive alternatives.   Advised to avoid cigarette smoking.  I discussed with the patient that most people either abstain from alcohol or drink within safe limits (<=14/week and <=4 drinks/occasion for males, <=7/weeks and <= 3 drinks/occasion for females) and that the risk for alcohol disorders and other health effects rises proportionally with the number of drinks per week and how often a drinker exceeds daily limits.  Discussed cessation/primary prevention of drug use and availability of treatment for abuse.   Diet: Encouraged to adjust caloric intake to maintain  or achieve ideal body weight, to reduce intake of dietary saturated fat and total fat, to limit sodium intake by avoiding high sodium foods and not adding table salt, and to maintain adequate dietary potassium and calcium preferably from fresh fruits, vegetables, and low-fat dairy products.    stressed the importance of regular exercise  Injury prevention: Discussed safety belts, safety helmets, smoke detector, smoking near bedding or upholstery.   Dental health: Discussed importance of regular tooth brushing, flossing, and dental visits.    NEXT PREVENTATIVE PHYSICAL DUE IN 1 YEAR. Return in about 3 months (around 04/10/2019) for HTN.

## 2019-01-09 NOTE — Patient Instructions (Signed)
DASH Eating Plan  DASH stands for "Dietary Approaches to Stop Hypertension." The DASH eating plan is a healthy eating plan that has been shown to reduce high blood pressure (hypertension). It may also reduce your risk for type 2 diabetes, heart disease, and stroke. The DASH eating plan may also help with weight loss.  What are tips for following this plan?    General guidelines   Avoid eating more than 2,300 mg (milligrams) of salt (sodium) a day. If you have hypertension, you may need to reduce your sodium intake to 1,500 mg a day.   Limit alcohol intake to no more than 1 drink a day for nonpregnant women and 2 drinks a day for men. One drink equals 12 oz of beer, 5 oz of wine, or 1 oz of hard liquor.   Work with your health care provider to maintain a healthy body weight or to lose weight. Ask what an ideal weight is for you.   Get at least 30 minutes of exercise that causes your heart to beat faster (aerobic exercise) most days of the week. Activities may include walking, swimming, or biking.   Work with your health care provider or diet and nutrition specialist (dietitian) to adjust your eating plan to your individual calorie needs.  Reading food labels     Check food labels for the amount of sodium per serving. Choose foods with less than 5 percent of the Daily Value of sodium. Generally, foods with less than 300 mg of sodium per serving fit into this eating plan.   To find whole grains, look for the word "whole" as the first word in the ingredient list.  Shopping   Buy products labeled as "low-sodium" or "no salt added."   Buy fresh foods. Avoid canned foods and premade or frozen meals.  Cooking   Avoid adding salt when cooking. Use salt-free seasonings or herbs instead of table salt or sea salt. Check with your health care provider or pharmacist before using salt substitutes.   Do not fry foods. Cook foods using healthy methods such as baking, boiling, grilling, and broiling instead.   Cook with  heart-healthy oils, such as olive, canola, soybean, or sunflower oil.  Meal planning   Eat a balanced diet that includes:  ? 5 or more servings of fruits and vegetables each day. At each meal, try to fill half of your plate with fruits and vegetables.  ? Up to 6-8 servings of whole grains each day.  ? Less than 6 oz of lean meat, poultry, or fish each day. A 3-oz serving of meat is about the same size as a deck of cards. One egg equals 1 oz.  ? 2 servings of low-fat dairy each day.  ? A serving of nuts, seeds, or beans 5 times each week.  ? Heart-healthy fats. Healthy fats called Omega-3 fatty acids are found in foods such as flaxseeds and coldwater fish, like sardines, salmon, and mackerel.   Limit how much you eat of the following:  ? Canned or prepackaged foods.  ? Food that is high in trans fat, such as fried foods.  ? Food that is high in saturated fat, such as fatty meat.  ? Sweets, desserts, sugary drinks, and other foods with added sugar.  ? Full-fat dairy products.   Do not salt foods before eating.   Try to eat at least 2 vegetarian meals each week.   Eat more home-cooked food and less restaurant, buffet, and fast food.     When eating at a restaurant, ask that your food be prepared with less salt or no salt, if possible.  What foods are recommended?  The items listed may not be a complete list. Talk with your dietitian about what dietary choices are best for you.  Grains  Whole-grain or whole-wheat bread. Whole-grain or whole-wheat pasta. Brown rice. Oatmeal. Quinoa. Bulgur. Whole-grain and low-sodium cereals. Pita bread. Low-fat, low-sodium crackers. Whole-wheat flour tortillas.  Vegetables  Fresh or frozen vegetables (raw, steamed, roasted, or grilled). Low-sodium or reduced-sodium tomato and vegetable juice. Low-sodium or reduced-sodium tomato sauce and tomato paste. Low-sodium or reduced-sodium canned vegetables.  Fruits  All fresh, dried, or frozen fruit. Canned fruit in natural juice (without  added sugar).  Meat and other protein foods  Skinless chicken or turkey. Ground chicken or turkey. Pork with fat trimmed off. Fish and seafood. Egg whites. Dried beans, peas, or lentils. Unsalted nuts, nut butters, and seeds. Unsalted canned beans. Lean cuts of beef with fat trimmed off. Low-sodium, lean deli meat.  Dairy  Low-fat (1%) or fat-free (skim) milk. Fat-free, low-fat, or reduced-fat cheeses. Nonfat, low-sodium ricotta or cottage cheese. Low-fat or nonfat yogurt. Low-fat, low-sodium cheese.  Fats and oils  Soft margarine without trans fats. Vegetable oil. Low-fat, reduced-fat, or light mayonnaise and salad dressings (reduced-sodium). Canola, safflower, olive, soybean, and sunflower oils. Avocado.  Seasoning and other foods  Herbs. Spices. Seasoning mixes without salt. Unsalted popcorn and pretzels. Fat-free sweets.  What foods are not recommended?  The items listed may not be a complete list. Talk with your dietitian about what dietary choices are best for you.  Grains  Baked goods made with fat, such as croissants, muffins, or some breads. Dry pasta or rice meal packs.  Vegetables  Creamed or fried vegetables. Vegetables in a cheese sauce. Regular canned vegetables (not low-sodium or reduced-sodium). Regular canned tomato sauce and paste (not low-sodium or reduced-sodium). Regular tomato and vegetable juice (not low-sodium or reduced-sodium). Pickles. Olives.  Fruits  Canned fruit in a light or heavy syrup. Fried fruit. Fruit in cream or butter sauce.  Meat and other protein foods  Fatty cuts of meat. Ribs. Fried meat. Bacon. Sausage. Bologna and other processed lunch meats. Salami. Fatback. Hotdogs. Bratwurst. Salted nuts and seeds. Canned beans with added salt. Canned or smoked fish. Whole eggs or egg yolks. Chicken or turkey with skin.  Dairy  Whole or 2% milk, cream, and half-and-half. Whole or full-fat cream cheese. Whole-fat or sweetened yogurt. Full-fat cheese. Nondairy creamers. Whipped toppings.  Processed cheese and cheese spreads.  Fats and oils  Butter. Stick margarine. Lard. Shortening. Ghee. Bacon fat. Tropical oils, such as coconut, palm kernel, or palm oil.  Seasoning and other foods  Salted popcorn and pretzels. Onion salt, garlic salt, seasoned salt, table salt, and sea salt. Worcestershire sauce. Tartar sauce. Barbecue sauce. Teriyaki sauce. Soy sauce, including reduced-sodium. Steak sauce. Canned and packaged gravies. Fish sauce. Oyster sauce. Cocktail sauce. Horseradish that you find on the shelf. Ketchup. Mustard. Meat flavorings and tenderizers. Bouillon cubes. Hot sauce and Tabasco sauce. Premade or packaged marinades. Premade or packaged taco seasonings. Relishes. Regular salad dressings.  Where to find more information:   National Heart, Lung, and Blood Institute: www.nhlbi.nih.gov   American Heart Association: www.heart.org  Summary   The DASH eating plan is a healthy eating plan that has been shown to reduce high blood pressure (hypertension). It may also reduce your risk for type 2 diabetes, heart disease, and stroke.   With the   DASH eating plan, you should limit salt (sodium) intake to 2,300 mg a day. If you have hypertension, you may need to reduce your sodium intake to 1,500 mg a day.   When on the DASH eating plan, aim to eat more fresh fruits and vegetables, whole grains, lean proteins, low-fat dairy, and heart-healthy fats.   Work with your health care provider or diet and nutrition specialist (dietitian) to adjust your eating plan to your individual calorie needs.  This information is not intended to replace advice given to you by your health care provider. Make sure you discuss any questions you have with your health care provider.  Document Released: 11/19/2011 Document Revised: 11/23/2016 Document Reviewed: 11/23/2016  Elsevier Interactive Patient Education  2019 [employer]

## 2019-01-09 NOTE — Assessment & Plan Note (Signed)
Focus on healthy diet at home and regular exercise.

## 2019-01-09 NOTE — Assessment & Plan Note (Signed)
Referral to PT for strengthening exercises.

## 2019-01-10 LAB — CBC WITH DIFFERENTIAL/PLATELET
Basophils Absolute: 0 10*3/uL (ref 0.0–0.2)
Basos: 0 %
EOS (ABSOLUTE): 0.1 10*3/uL (ref 0.0–0.4)
Eos: 2 %
Hematocrit: 41.8 % (ref 34.0–46.6)
Hemoglobin: 13.8 g/dL (ref 11.1–15.9)
Immature Grans (Abs): 0 10*3/uL (ref 0.0–0.1)
Immature Granulocytes: 1 %
Lymphocytes Absolute: 2.3 10*3/uL (ref 0.7–3.1)
Lymphs: 40 %
MCH: 30.4 pg (ref 26.6–33.0)
MCHC: 33 g/dL (ref 31.5–35.7)
MCV: 92 fL (ref 79–97)
Monocytes Absolute: 0.5 10*3/uL (ref 0.1–0.9)
Monocytes: 8 %
Neutrophils Absolute: 2.9 10*3/uL (ref 1.4–7.0)
Neutrophils: 49 %
Platelets: 291 10*3/uL (ref 150–450)
RBC: 4.54 x10E6/uL (ref 3.77–5.28)
RDW: 13.2 % (ref 11.7–15.4)
WBC: 5.8 10*3/uL (ref 3.4–10.8)

## 2019-01-10 LAB — TSH: TSH: 1.29 u[IU]/mL (ref 0.450–4.500)

## 2019-01-10 LAB — COMPREHENSIVE METABOLIC PANEL
ALK PHOS: 62 IU/L (ref 39–117)
ALT: 22 IU/L (ref 0–32)
AST: 19 IU/L (ref 0–40)
Albumin/Globulin Ratio: 1.4 (ref 1.2–2.2)
Albumin: 4.2 g/dL (ref 3.8–4.8)
BUN/Creatinine Ratio: 24 (ref 12–28)
BUN: 16 mg/dL (ref 8–27)
Bilirubin Total: 0.4 mg/dL (ref 0.0–1.2)
CO2: 23 mmol/L (ref 20–29)
CREATININE: 0.66 mg/dL (ref 0.57–1.00)
Calcium: 8.9 mg/dL (ref 8.7–10.3)
Chloride: 103 mmol/L (ref 96–106)
GFR calc Af Amer: 108 mL/min/{1.73_m2} (ref >59)
GFR calc non Af Amer: 94 mL/min/{1.73_m2} (ref >59)
GLUCOSE: 101 mg/dL — AB (ref 65–99)
Globulin, Total: 3.1 g/dL (ref 1.5–4.5)
Potassium: 4.6 mmol/L (ref 3.5–5.2)
SODIUM: 140 mmol/L (ref 134–144)
Total Protein: 7.3 g/dL (ref 6.0–8.5)

## 2019-01-10 LAB — VITAMIN D 25 HYDROXY (VIT D DEFICIENCY, FRACTURES): Vit D, 25-Hydroxy: 38.7 ng/mL (ref 30.0–100.0)

## 2019-01-10 LAB — LIPID PANEL W/O CHOL/HDL RATIO
Cholesterol, Total: 192 mg/dL (ref 100–199)
HDL: 59 mg/dL (ref >39)
LDL CALC: 114 mg/dL — AB (ref 0–99)
Triglycerides: 95 mg/dL (ref 0–149)
VLDL Cholesterol Cal: 19 mg/dL (ref 5–40)

## 2019-01-11 ENCOUNTER — Other Ambulatory Visit: Payer: Self-pay | Admitting: Nurse Practitioner

## 2019-01-11 MED ORDER — ZOSTER VAC RECOMB ADJUVANTED 50 MCG/0.5ML IM SUSR: 0.5000 mL | Freq: Once | INTRAMUSCULAR | 0 refills | Status: AC

## 2019-01-11 NOTE — Telephone Encounter (Signed)
Pt requesting Shingrix RX be sent to pharmacy.

## 2019-01-11 NOTE — Progress Notes (Signed)
Shingrix vaccine ordered per patient request.

## 2019-01-16 ENCOUNTER — Other Ambulatory Visit: Payer: Self-pay | Admitting: Nurse Practitioner

## 2019-01-16 DIAGNOSIS — R399 Unspecified symptoms and signs involving the genitourinary system: Secondary | ICD-10-CM

## 2019-01-16 NOTE — Progress Notes (Signed)
Patient is reporting UTI symptoms and would like to provider urine sample in morning.  Order placed.

## 2019-01-17 ENCOUNTER — Other Ambulatory Visit: Payer: BC Managed Care – PPO

## 2019-01-17 ENCOUNTER — Telehealth: Payer: Self-pay | Admitting: Nurse Practitioner

## 2019-01-17 ENCOUNTER — Other Ambulatory Visit: Payer: Self-pay | Admitting: Nurse Practitioner

## 2019-01-17 DIAGNOSIS — R399 Unspecified symptoms and signs involving the genitourinary system: Secondary | ICD-10-CM

## 2019-01-17 MED ORDER — SULFAMETHOXAZOLE-TRIMETHOPRIM 800-160 MG PO TABS: 1.0000 | ORAL_TABLET | Freq: Two times a day (BID) | ORAL | 0 refills | Status: AC

## 2019-01-17 NOTE — Telephone Encounter (Signed)
Thank you :)

## 2019-01-17 NOTE — Telephone Encounter (Signed)
Pt presented in office to give urine sample states that if you prescribe any medicine today please send to [facility] in [place] as to she will be here in town today.

## 2019-01-17 NOTE — Progress Notes (Signed)
Urine sample presenting like UTI and patient reports symptoms.  Has used Bactrim DS before for UTI with success.  Will send three day script into [facility].  Contacted patient via MyChart and advised to return to office if continued or worsening symptoms.

## 2019-01-19 LAB — UA/M W/RFLX CULTURE, ROUTINE
Bilirubin, UA: NEGATIVE
Glucose, UA: NEGATIVE
Ketones, UA: NEGATIVE
Nitrite, UA: NEGATIVE
Protein, UA: NEGATIVE
Specific Gravity, UA: 1.015 (ref 1.005–1.030)
Urobilinogen, Ur: 0.2 mg/dL (ref 0.2–1.0)
pH, UA: 6 (ref 5.0–7.5)

## 2019-01-19 LAB — URINE CULTURE, REFLEX

## 2019-01-19 LAB — MICROSCOPIC EXAMINATION

## 2019-02-24 ENCOUNTER — Other Ambulatory Visit: Payer: Self-pay | Admitting: Nurse Practitioner

## 2019-02-24 ENCOUNTER — Other Ambulatory Visit: Payer: Self-pay

## 2019-02-24 ENCOUNTER — Other Ambulatory Visit: Payer: BC Managed Care – PPO

## 2019-02-24 DIAGNOSIS — R300 Dysuria: Secondary | ICD-10-CM

## 2019-02-24 MED ORDER — AMOXICILLIN-POT CLAVULANATE 875-125 MG PO TABS: 1.0000 | ORAL_TABLET | Freq: Two times a day (BID) | ORAL | 0 refills | Status: DC

## 2019-02-24 NOTE — Progress Notes (Signed)
Patient reporting UTI symptoms, walk in for urine noted 1+ blood, trace protein, 3+ leu, mod bacteria, and >30 WBC.  Reports frequent UTI may be coming from her hot tub and is going to schedule for professional clean.  Last treated with Bactrim a couple months ago.  Will send in Augmentin.

## 2019-02-24 NOTE — Progress Notes (Signed)
Patient reports feeling like UTI is starting . Will have her walk in and will obtain urine sample.

## 2019-02-28 ENCOUNTER — Telehealth: Payer: Self-pay | Admitting: Nurse Practitioner

## 2019-02-28 LAB — MICROSCOPIC EXAMINATION: WBC, UA: >30 /hpf — AB (ref 0–5)

## 2019-02-28 LAB — URINE CULTURE, REFLEX

## 2019-02-28 LAB — UA/M W/RFLX CULTURE, ROUTINE
BILIRUBIN UA: NEGATIVE
GLUCOSE, UA: NEGATIVE
Ketones, UA: NEGATIVE
Nitrite, UA: NEGATIVE
Specific Gravity, UA: 1.015 (ref 1.005–1.030)
Urobilinogen, Ur: 0.2 mg/dL (ref 0.2–1.0)
pH, UA: 7 (ref 5.0–7.5)

## 2019-02-28 MED ORDER — NITROFURANTOIN MONOHYD MACRO 100 MG PO CAPS: 100.0000 mg | ORAL_CAPSULE | Freq: Two times a day (BID) | ORAL | 0 refills | Status: AC

## 2019-02-28 NOTE — Telephone Encounter (Signed)
Spoke to patient via telephone with urine culture results.  Urine returned resistant to current antibiotic being used.  Will switch to Macrobid x 5 days.

## 2019-04-10 ENCOUNTER — Ambulatory Visit: Payer: BC Managed Care – PPO | Admitting: Nurse Practitioner

## 2019-04-17 ENCOUNTER — Encounter: Payer: Self-pay | Admitting: Nurse Practitioner

## 2019-04-17 ENCOUNTER — Other Ambulatory Visit: Payer: Self-pay

## 2019-04-17 ENCOUNTER — Ambulatory Visit (INDEPENDENT_AMBULATORY_CARE_PROVIDER_SITE_OTHER): Payer: BC Managed Care – PPO | Admitting: Nurse Practitioner

## 2019-04-17 VITALS — BP 174/89 | HR 86 | Temp 97.7°F | Ht 67.0 in | Wt 200.0 lb

## 2019-04-17 DIAGNOSIS — I10 Essential (primary) hypertension: Secondary | ICD-10-CM | POA: Diagnosis not present

## 2019-04-17 MED ORDER — LOSARTAN POTASSIUM 25 MG PO TABS: 25.0000 mg | ORAL_TABLET | Freq: Every day | ORAL | 3 refills | Status: DC

## 2019-04-17 NOTE — Patient Instructions (Signed)
DASH Eating Plan  DASH stands for "Dietary Approaches to Stop Hypertension." The DASH eating plan is a healthy eating plan that has been shown to reduce high blood pressure (hypertension). It may also reduce your risk for type 2 diabetes, heart disease, and stroke. The DASH eating plan may also help with weight loss.  What are tips for following this plan?    General guidelines   Avoid eating more than 2,300 mg (milligrams) of salt (sodium) a day. If you have hypertension, you may need to reduce your sodium intake to 1,500 mg a day.   Limit alcohol intake to no more than 1 drink a day for nonpregnant women and 2 drinks a day for men. One drink equals 12 oz of beer, 5 oz of wine, or 1 oz of hard liquor.   Work with your health care provider to maintain a healthy body weight or to lose weight. Ask what an ideal weight is for you.   Get at least 30 minutes of exercise that causes your heart to beat faster (aerobic exercise) most days of the week. Activities may include walking, swimming, or biking.   Work with your health care provider or diet and nutrition specialist (dietitian) to adjust your eating plan to your individual calorie needs.  Reading food labels     Check food labels for the amount of sodium per serving. Choose foods with less than 5 percent of the Daily Value of sodium. Generally, foods with less than 300 mg of sodium per serving fit into this eating plan.   To find whole grains, look for the word "whole" as the first word in the ingredient list.  Shopping   Buy products labeled as "low-sodium" or "no salt added."   Buy fresh foods. Avoid canned foods and premade or frozen meals.  Cooking   Avoid adding salt when cooking. Use salt-free seasonings or herbs instead of table salt or sea salt. Check with your health care provider or pharmacist before using salt substitutes.   Do not fry foods. Cook foods using healthy methods such as baking, boiling, grilling, and broiling instead.   Cook with  heart-healthy oils, such as olive, canola, soybean, or sunflower oil.  Meal planning   Eat a balanced diet that includes:  ? 5 or more servings of fruits and vegetables each day. At each meal, try to fill half of your plate with fruits and vegetables.  ? Up to 6-8 servings of whole grains each day.  ? Less than 6 oz of lean meat, poultry, or fish each day. A 3-oz serving of meat is about the same size as a deck of cards. One egg equals 1 oz.  ? 2 servings of low-fat dairy each day.  ? A serving of nuts, seeds, or beans 5 times each week.  ? Heart-healthy fats. Healthy fats called Omega-3 fatty acids are found in foods such as flaxseeds and coldwater fish, like sardines, salmon, and mackerel.   Limit how much you eat of the following:  ? Canned or prepackaged foods.  ? Food that is high in trans fat, such as fried foods.  ? Food that is high in saturated fat, such as fatty meat.  ? Sweets, desserts, sugary drinks, and other foods with added sugar.  ? Full-fat dairy products.   Do not salt foods before eating.   Try to eat at least 2 vegetarian meals each week.   Eat more home-cooked food and less restaurant, buffet, and fast food.     When eating at a restaurant, ask that your food be prepared with less salt or no salt, if possible.  What foods are recommended?  The items listed may not be a complete list. Talk with your dietitian about what dietary choices are best for you.  Grains  Whole-grain or whole-wheat bread. Whole-grain or whole-wheat pasta. Brown rice. Oatmeal. Quinoa. Bulgur. Whole-grain and low-sodium cereals. Pita bread. Low-fat, low-sodium crackers. Whole-wheat flour tortillas.  Vegetables  Fresh or frozen vegetables (raw, steamed, roasted, or grilled). Low-sodium or reduced-sodium tomato and vegetable juice. Low-sodium or reduced-sodium tomato sauce and tomato paste. Low-sodium or reduced-sodium canned vegetables.  Fruits  All fresh, dried, or frozen fruit. Canned fruit in natural juice (without  added sugar).  Meat and other protein foods  Skinless chicken or turkey. Ground chicken or turkey. Pork with fat trimmed off. Fish and seafood. Egg whites. Dried beans, peas, or lentils. Unsalted nuts, nut butters, and seeds. Unsalted canned beans. Lean cuts of beef with fat trimmed off. Low-sodium, lean deli meat.  Dairy  Low-fat (1%) or fat-free (skim) milk. Fat-free, low-fat, or reduced-fat cheeses. Nonfat, low-sodium ricotta or cottage cheese. Low-fat or nonfat yogurt. Low-fat, low-sodium cheese.  Fats and oils  Soft margarine without trans fats. Vegetable oil. Low-fat, reduced-fat, or light mayonnaise and salad dressings (reduced-sodium). Canola, safflower, olive, soybean, and sunflower oils. Avocado.  Seasoning and other foods  Herbs. Spices. Seasoning mixes without salt. Unsalted popcorn and pretzels. Fat-free sweets.  What foods are not recommended?  The items listed may not be a complete list. Talk with your dietitian about what dietary choices are best for you.  Grains  Baked goods made with fat, such as croissants, muffins, or some breads. Dry pasta or rice meal packs.  Vegetables  Creamed or fried vegetables. Vegetables in a cheese sauce. Regular canned vegetables (not low-sodium or reduced-sodium). Regular canned tomato sauce and paste (not low-sodium or reduced-sodium). Regular tomato and vegetable juice (not low-sodium or reduced-sodium). Pickles. Olives.  Fruits  Canned fruit in a light or heavy syrup. Fried fruit. Fruit in cream or butter sauce.  Meat and other protein foods  Fatty cuts of meat. Ribs. Fried meat. Bacon. Sausage. Bologna and other processed lunch meats. Salami. Fatback. Hotdogs. Bratwurst. Salted nuts and seeds. Canned beans with added salt. Canned or smoked fish. Whole eggs or egg yolks. Chicken or turkey with skin.  Dairy  Whole or 2% milk, cream, and half-and-half. Whole or full-fat cream cheese. Whole-fat or sweetened yogurt. Full-fat cheese. Nondairy creamers. Whipped toppings.  Processed cheese and cheese spreads.  Fats and oils  Butter. Stick margarine. Lard. Shortening. Ghee. Bacon fat. Tropical oils, such as coconut, palm kernel, or palm oil.  Seasoning and other foods  Salted popcorn and pretzels. Onion salt, garlic salt, seasoned salt, table salt, and sea salt. Worcestershire sauce. Tartar sauce. Barbecue sauce. Teriyaki sauce. Soy sauce, including reduced-sodium. Steak sauce. Canned and packaged gravies. Fish sauce. Oyster sauce. Cocktail sauce. Horseradish that you find on the shelf. Ketchup. Mustard. Meat flavorings and tenderizers. Bouillon cubes. Hot sauce and Tabasco sauce. Premade or packaged marinades. Premade or packaged taco seasonings. Relishes. Regular salad dressings.  Where to find more information:   National Heart, Lung, and Blood Institute: www.nhlbi.nih.gov   American Heart Association: www.heart.org  Summary   The DASH eating plan is a healthy eating plan that has been shown to reduce high blood pressure (hypertension). It may also reduce your risk for type 2 diabetes, heart disease, and stroke.   With the   DASH eating plan, you should limit salt (sodium) intake to 2,300 mg a day. If you have hypertension, you may need to reduce your sodium intake to 1,500 mg a day.   When on the DASH eating plan, aim to eat more fresh fruits and vegetables, whole grains, lean proteins, low-fat dairy, and heart-healthy fats.   Work with your health care provider or diet and nutrition specialist (dietitian) to adjust your eating plan to your individual calorie needs.  This information is not intended to replace advice given to you by your health care provider. Make sure you discuss any questions you have with your health care provider.  Document Released: 11/19/2011 Document Revised: 11/23/2016 Document Reviewed: 11/23/2016  Elsevier Interactive Patient Education  2019 [employer]

## 2019-04-17 NOTE — Assessment & Plan Note (Signed)
Chronic, ongoing and not at goal.  Script for Losartan 25 MG sent.  Continue daily BP checks.  Follow-up in 4 weeks and adjust medication as needed to meet goal.

## 2019-04-17 NOTE — Progress Notes (Signed)
BP (!) 174/89   Pulse 86   Temp 97.7 F (36.5 C) (Oral)   Ht 5\' 7"  (1.702 m)   Wt 200 lb (90.7 kg)   LMP  (LMP Unknown)   SpO2 98%   BMI 31.32 kg/m    Subjective:    Patient ID: Mary Wood, female    DOB: [birth year], 63 y.o.   MRN: [id number]  HPI: Mary Wood is a 63 y.o. female  Chief Complaint  Patient presents with  . Hypertension    41m f/u    . This visit was completed via WebEx due to the restrictions of the COVID-19 pandemic. All issues as above were discussed and addressed. Physical exam was done as above through visual confirmation on WebEx. If it was felt that the patient should be evaluated in the office, they were directed there. The patient verbally consented to this visit. . Location of the patient: work . Location of the provider: work . Those involved with this call:  . Provider: Aura Dials, DNP . CMA: Elton Sin, CMA . Front Desk/Registration: Harriet Pho  . Time spent on call: 15 minutes with patient face to face via video conference. More than 50% of this time was spent in counseling and coordination of care. 10 minutes total spent in review of patient's record and preparation of their chart.   HYPERTENSION Has refused medication in past and wishes to focus on diet.  Returns today for BP review.  We discussed at length today and she is agreeable to starting a low dose of Losartan. Hypertension status: uncontrolled  Satisfied with current treatment? no current treatment Duration of hypertension: chronic BP monitoring frequency:  daily BP range: 150-160/80-90's BP medication side effects:  no Medication compliance: good compliance Aspirin: no Recurrent headaches: no Visual changes: no Palpitations: no Dyspnea: no Chest pain: no Lower extremity edema: no Dizzy/lightheaded: no  Relevant past medical, surgical, family and social history reviewed and updated as indicated. Interim medical history since our last visit reviewed. Allergies and  medications reviewed and updated.  Review of Systems  Constitutional: Negative for activity change, appetite change, diaphoresis, fatigue and fever.  Respiratory: Negative for cough, chest tightness and shortness of breath.   Cardiovascular: Negative for chest pain, palpitations and leg swelling.  Gastrointestinal: Negative for abdominal distention, abdominal pain, constipation, diarrhea, nausea and vomiting.  Endocrine: Negative for cold intolerance, heat intolerance, polydipsia, polyphagia and polyuria.  Neurological: Negative for dizziness, syncope, weakness, light-headedness, numbness and headaches.  Psychiatric/Behavioral: Negative.     Per HPI unless specifically indicated above     Objective:    BP (!) 174/89   Pulse 86   Temp 97.7 F (36.5 C) (Oral)   Ht 5\' 7"  (1.702 m)   Wt 200 lb (90.7 kg)   LMP  (LMP Unknown)   SpO2 98%   BMI 31.32 kg/m   Wt Readings from Last 3 Encounters:  04/17/19 200 lb (90.7 kg)  01/09/19 200 lb (90.7 kg)  12/28/18 200 lb (90.7 kg)    Physical Exam Vitals signs and nursing note reviewed.  Constitutional:      Appearance: She is well-developed. She is obese.  HENT:     Head: Normocephalic.     Right Ear: Hearing normal.     Left Ear: Hearing normal.  Eyes:     General:        Right eye: No discharge.        Left eye: No discharge.     Conjunctiva/sclera: Conjunctivae  normal.     Pupils: Pupils are equal, round, and reactive to light.  Neck:     Musculoskeletal: Normal range of motion.  Cardiovascular:     Comments: Unable to auscultate due to virtual visit only Pulmonary:     Effort: Pulmonary effort is normal. No accessory muscle usage or respiratory distress.     Comments: Unable to auscultate due to virtual visit only Neurological:     Mental Status: She is alert and oriented to person, place, and time.  Psychiatric:        Mood and Affect: Mood normal.        Behavior: Behavior normal.        Thought Content: Thought  content normal.        Judgment: Judgment normal.     Results for orders placed or performed in visit on 02/24/19  Microscopic Examination  Result Value Ref Range   WBC, UA >30 (A) 0 - 5 /hpf   RBC, UA 3-10 (A) 0 - 2 /hpf   Epithelial Cells (non renal) 0-10 0 - 10 /hpf   Bacteria, UA Moderate (A) None seen/Few  Urine Culture, Reflex  Result Value Ref Range   Urine Culture, Routine Final report (A)    Organism ID, Bacteria Escherichia coli (A)    Antimicrobial Susceptibility Comment   UA/M w/rflx Culture, Routine  Result Value Ref Range   Specific Gravity, UA 1.015 1.005 - 1.030   pH, UA 7.0 5.0 - 7.5   Color, UA Yellow Yellow   Appearance Ur Cloudy (A) Clear   Leukocytes, UA 3+ (A) Negative   Protein, UA Trace (A) Negative/Trace   Glucose, UA Negative Negative   Ketones, UA Negative Negative   RBC, UA 1+ (A) Negative   Bilirubin, UA Negative Negative   Urobilinogen, Ur 0.2 0.2 - 1.0 mg/dL   Nitrite, UA Negative Negative   Microscopic Examination See below:    Urinalysis Reflex Comment       Assessment & Plan:   Problem List Items Addressed This Visit      Cardiovascular and Mediastinum   Hypertension - Primary    Chronic, ongoing and not at goal.  Script for Losartan 25 MG sent.  Continue daily BP checks.  Follow-up in 4 weeks and adjust medication as needed to meet goal.      Relevant Medications   losartan (COZAAR) 25 MG tablet      I discussed the assessment and treatment plan with the patient. The patient was provided an opportunity to ask questions and all were answered. The patient agreed with the plan and demonstrated an understanding of the instructions.   The patient was advised to call back or seek an in-person evaluation if the symptoms worsen or if the condition fails to improve as anticipated.   I provided 15 minutes of time during this encounter.  Follow up plan: Return in about 4 weeks (around 05/15/2019) for HTN.

## 2019-05-11 ENCOUNTER — Ambulatory Visit: Payer: BC Managed Care – PPO | Admitting: Nurse Practitioner

## 2019-05-17 ENCOUNTER — Encounter: Payer: Self-pay | Admitting: Nurse Practitioner

## 2019-05-17 ENCOUNTER — Ambulatory Visit (INDEPENDENT_AMBULATORY_CARE_PROVIDER_SITE_OTHER): Payer: BC Managed Care – PPO | Admitting: Nurse Practitioner

## 2019-05-17 ENCOUNTER — Other Ambulatory Visit: Payer: Self-pay

## 2019-05-17 VITALS — BP 147/86 | HR 84 | Temp 97.5°F

## 2019-05-17 DIAGNOSIS — I10 Essential (primary) hypertension: Secondary | ICD-10-CM

## 2019-05-17 MED ORDER — LOSARTAN POTASSIUM 50 MG PO TABS: 50.0000 mg | ORAL_TABLET | Freq: Every day | ORAL | 5 refills | Status: DC

## 2019-05-17 NOTE — Patient Instructions (Signed)
DASH Eating Plan  DASH stands for "Dietary Approaches to Stop Hypertension." The DASH eating plan is a healthy eating plan that has been shown to reduce high blood pressure (hypertension). It may also reduce your risk for type 2 diabetes, heart disease, and stroke. The DASH eating plan may also help with weight loss.  What are tips for following this plan?    General guidelines   Avoid eating more than 2,300 mg (milligrams) of salt (sodium) a day. If you have hypertension, you may need to reduce your sodium intake to 1,500 mg a day.   Limit alcohol intake to no more than 1 drink a day for nonpregnant women and 2 drinks a day for men. One drink equals 12 oz of beer, 5 oz of wine, or 1 oz of hard liquor.   Work with your health care provider to maintain a healthy body weight or to lose weight. Ask what an ideal weight is for you.   Get at least 30 minutes of exercise that causes your heart to beat faster (aerobic exercise) most days of the week. Activities may include walking, swimming, or biking.   Work with your health care provider or diet and nutrition specialist (dietitian) to adjust your eating plan to your individual calorie needs.  Reading food labels     Check food labels for the amount of sodium per serving. Choose foods with less than 5 percent of the Daily Value of sodium. Generally, foods with less than 300 mg of sodium per serving fit into this eating plan.   To find whole grains, look for the word "whole" as the first word in the ingredient list.  Shopping   Buy products labeled as "low-sodium" or "no salt added."   Buy fresh foods. Avoid canned foods and premade or frozen meals.  Cooking   Avoid adding salt when cooking. Use salt-free seasonings or herbs instead of table salt or sea salt. Check with your health care provider or pharmacist before using salt substitutes.   Do not fry foods. Cook foods using healthy methods such as baking, boiling, grilling, and broiling instead.   Cook with  heart-healthy oils, such as olive, canola, soybean, or sunflower oil.  Meal planning   Eat a balanced diet that includes:  ? 5 or more servings of fruits and vegetables each day. At each meal, try to fill half of your plate with fruits and vegetables.  ? Up to 6-8 servings of whole grains each day.  ? Less than 6 oz of lean meat, poultry, or fish each day. A 3-oz serving of meat is about the same size as a deck of cards. One egg equals 1 oz.  ? 2 servings of low-fat dairy each day.  ? A serving of nuts, seeds, or beans 5 times each week.  ? Heart-healthy fats. Healthy fats called Omega-3 fatty acids are found in foods such as flaxseeds and coldwater fish, like sardines, salmon, and mackerel.   Limit how much you eat of the following:  ? Canned or prepackaged foods.  ? Food that is high in trans fat, such as fried foods.  ? Food that is high in saturated fat, such as fatty meat.  ? Sweets, desserts, sugary drinks, and other foods with added sugar.  ? Full-fat dairy products.   Do not salt foods before eating.   Try to eat at least 2 vegetarian meals each week.   Eat more home-cooked food and less restaurant, buffet, and fast food.     When eating at a restaurant, ask that your food be prepared with less salt or no salt, if possible.  What foods are recommended?  The items listed may not be a complete list. Talk with your dietitian about what dietary choices are best for you.  Grains  Whole-grain or whole-wheat bread. Whole-grain or whole-wheat pasta. Brown rice. Oatmeal. Quinoa. Bulgur. Whole-grain and low-sodium cereals. Pita bread. Low-fat, low-sodium crackers. Whole-wheat flour tortillas.  Vegetables  Fresh or frozen vegetables (raw, steamed, roasted, or grilled). Low-sodium or reduced-sodium tomato and vegetable juice. Low-sodium or reduced-sodium tomato sauce and tomato paste. Low-sodium or reduced-sodium canned vegetables.  Fruits  All fresh, dried, or frozen fruit. Canned fruit in natural juice (without  added sugar).  Meat and other protein foods  Skinless chicken or turkey. Ground chicken or turkey. Pork with fat trimmed off. Fish and seafood. Egg whites. Dried beans, peas, or lentils. Unsalted nuts, nut butters, and seeds. Unsalted canned beans. Lean cuts of beef with fat trimmed off. Low-sodium, lean deli meat.  Dairy  Low-fat (1%) or fat-free (skim) milk. Fat-free, low-fat, or reduced-fat cheeses. Nonfat, low-sodium ricotta or cottage cheese. Low-fat or nonfat yogurt. Low-fat, low-sodium cheese.  Fats and oils  Soft margarine without trans fats. Vegetable oil. Low-fat, reduced-fat, or light mayonnaise and salad dressings (reduced-sodium). Canola, safflower, olive, soybean, and sunflower oils. Avocado.  Seasoning and other foods  Herbs. Spices. Seasoning mixes without salt. Unsalted popcorn and pretzels. Fat-free sweets.  What foods are not recommended?  The items listed may not be a complete list. Talk with your dietitian about what dietary choices are best for you.  Grains  Baked goods made with fat, such as croissants, muffins, or some breads. Dry pasta or rice meal packs.  Vegetables  Creamed or fried vegetables. Vegetables in a cheese sauce. Regular canned vegetables (not low-sodium or reduced-sodium). Regular canned tomato sauce and paste (not low-sodium or reduced-sodium). Regular tomato and vegetable juice (not low-sodium or reduced-sodium). Pickles. Olives.  Fruits  Canned fruit in a light or heavy syrup. Fried fruit. Fruit in cream or butter sauce.  Meat and other protein foods  Fatty cuts of meat. Ribs. Fried meat. Bacon. Sausage. Bologna and other processed lunch meats. Salami. Fatback. Hotdogs. Bratwurst. Salted nuts and seeds. Canned beans with added salt. Canned or smoked fish. Whole eggs or egg yolks. Chicken or turkey with skin.  Dairy  Whole or 2% milk, cream, and half-and-half. Whole or full-fat cream cheese. Whole-fat or sweetened yogurt. Full-fat cheese. Nondairy creamers. Whipped toppings.  Processed cheese and cheese spreads.  Fats and oils  Butter. Stick margarine. Lard. Shortening. Ghee. Bacon fat. Tropical oils, such as coconut, palm kernel, or palm oil.  Seasoning and other foods  Salted popcorn and pretzels. Onion salt, garlic salt, seasoned salt, table salt, and sea salt. Worcestershire sauce. Tartar sauce. Barbecue sauce. Teriyaki sauce. Soy sauce, including reduced-sodium. Steak sauce. Canned and packaged gravies. Fish sauce. Oyster sauce. Cocktail sauce. Horseradish that you find on the shelf. Ketchup. Mustard. Meat flavorings and tenderizers. Bouillon cubes. Hot sauce and Tabasco sauce. Premade or packaged marinades. Premade or packaged taco seasonings. Relishes. Regular salad dressings.  Where to find more information:   National Heart, Lung, and Blood Institute: www.nhlbi.nih.gov   American Heart Association: www.heart.org  Summary   The DASH eating plan is a healthy eating plan that has been shown to reduce high blood pressure (hypertension). It may also reduce your risk for type 2 diabetes, heart disease, and stroke.   With the   DASH eating plan, you should limit salt (sodium) intake to 2,300 mg a day. If you have hypertension, you may need to reduce your sodium intake to 1,500 mg a day.   When on the DASH eating plan, aim to eat more fresh fruits and vegetables, whole grains, lean proteins, low-fat dairy, and heart-healthy fats.   Work with your health care provider or diet and nutrition specialist (dietitian) to adjust your eating plan to your individual calorie needs.  This information is not intended to replace advice given to you by your health care provider. Make sure you discuss any questions you have with your health care provider.  Document Released: 11/19/2011 Document Revised: 11/23/2016 Document Reviewed: 11/23/2016  Elsevier Interactive Patient Education  2019 [employer]

## 2019-05-17 NOTE — Progress Notes (Signed)
BP (!) 147/86    Pulse 84    Temp (!) 97.5 F (36.4 C)    LMP  (LMP Unknown)    SpO2 97%    Subjective:    Patient ID: Mary Wood, female    DOB: May 28, 1956, 63 y.o.   MRN: [id number]  HPI: Mary Wood is a 63 y.o. female  Chief Complaint  Patient presents with   Hypertension     This visit was completed via WebEx due to the restrictions of the COVID-19 pandemic. All issues as above were discussed and addressed. Physical exam was done as above through visual confirmation on WebEx. If it was felt that the patient should be evaluated in the office, they were directed there. The patient verbally consented to this visit.  Location of the patient: work  Location of the provider: work  Those involved with this call:   Provider: Aura Dials, DNP  CMA: Wilhemena Durie, CMA  Front Desk/Registration: Harriet Pho   Time spent on call: 15 minutes with patient face to face via video conference. More than 50% of this time was spent in counseling and coordination of care. 5 minutes total spent in review of patient's record and preparation of their chart. I verified patient identity using two factors (patient name and date of birth). Patient consents verbally to being seen via telemedicine visit today.   HYPERTENSION BP levels have been trending downwards, SBP had been 170 range and now trending to 140-150 on her home checks.  Started Losartan 25 MG last visit. Hypertension status: stable  Satisfied with current treatment? yes Duration of hypertension: months BP monitoring frequency:  daily BP range: 140 - 150/80's BP medication side effects:  no Medication compliance: good compliance Aspirin: no Recurrent headaches: no Visual changes: no Palpitations: no Dyspnea: no Chest pain: no Lower extremity edema: no Dizzy/lightheaded: no  Relevant past medical, surgical, family and social history reviewed and updated as indicated. Interim medical history since our last visit  reviewed. Allergies and medications reviewed and updated.  Review of Systems  Constitutional: Negative for activity change, appetite change, diaphoresis, fatigue and fever.  Respiratory: Negative for cough, chest tightness and shortness of breath.   Cardiovascular: Negative for chest pain, palpitations and leg swelling.  Gastrointestinal: Negative for abdominal distention, abdominal pain, constipation, diarrhea, nausea and vomiting.  Neurological: Negative for dizziness, syncope, weakness, light-headedness, numbness and headaches.  Psychiatric/Behavioral: Negative.     Per HPI unless specifically indicated above     Objective:    BP (!) 147/86    Pulse 84    Temp (!) 97.5 F (36.4 C)    LMP  (LMP Unknown)    SpO2 97%   Wt Readings from Last 3 Encounters:  04/17/19 200 lb (90.7 kg)  01/09/19 200 lb (90.7 kg)  12/28/18 200 lb (90.7 kg)    Physical Exam Vitals signs and nursing note reviewed.  Constitutional:      General: She is awake. She is not in acute distress.    Appearance: She is well-developed. She is not ill-appearing.  HENT:     Head: Normocephalic.     Right Ear: Hearing normal.     Left Ear: Hearing normal.  Eyes:     General: Lids are normal.        Right eye: No discharge.        Left eye: No discharge.     Conjunctiva/sclera: Conjunctivae normal.  Neck:     Musculoskeletal: Normal range of motion.  Cardiovascular:  Comments: Unable to auscultate due to virtual exam only  Pulmonary:     Effort: Pulmonary effort is normal. No accessory muscle usage or respiratory distress.     Comments: Unable to auscultate due to virtual exam only Neurological:     Mental Status: She is alert and oriented to person, place, and time.  Psychiatric:        Attention and Perception: Attention normal.        Mood and Affect: Mood normal.        Behavior: Behavior normal. Behavior is cooperative.        Thought Content: Thought content normal.        Judgment: Judgment  normal.     Results for orders placed or performed in visit on 02/24/19  Microscopic Examination  Result Value Ref Range   WBC, UA >30 (A) 0 - 5 /hpf   RBC, UA 3-10 (A) 0 - 2 /hpf   Epithelial Cells (non renal) 0-10 0 - 10 /hpf   Bacteria, UA Moderate (A) None seen/Few  Urine Culture, Reflex  Result Value Ref Range   Urine Culture, Routine Final report (A)    Organism ID, Bacteria Escherichia coli (A)    Antimicrobial Susceptibility Comment   UA/M w/rflx Culture, Routine  Result Value Ref Range   Specific Gravity, UA 1.015 1.005 - 1.030   pH, UA 7.0 5.0 - 7.5   Color, UA Yellow Yellow   Appearance Ur Cloudy (A) Clear   Leukocytes, UA 3+ (A) Negative   Protein, UA Trace (A) Negative/Trace   Glucose, UA Negative Negative   Ketones, UA Negative Negative   RBC, UA 1+ (A) Negative   Bilirubin, UA Negative Negative   Urobilinogen, Ur 0.2 0.2 - 1.0 mg/dL   Nitrite, UA Negative Negative   Microscopic Examination See below:    Urinalysis Reflex Comment       Assessment & Plan:   Problem List Items Addressed This Visit      Cardiovascular and Mediastinum   Hypertension - Primary    Chronic, ongoing, not at goal.  Will increase Losartan to 50 MG daily, script sent.  Continue daily BP checks and obtain BMP outpatient.  Return in 4 weeks.  Goal BP < 130/90.      Relevant Medications   losartan (COZAAR) 50 MG tablet   Other Relevant Orders   Basic Metabolic Panel (BMET)      I discussed the assessment and treatment plan with the patient. The patient was provided an opportunity to ask questions and all were answered. The patient agreed with the plan and demonstrated an understanding of the instructions.   The patient was advised to call back or seek an in-person evaluation if the symptoms worsen or if the condition fails to improve as anticipated.   I provided 15 minutes of time during this encounter.  Follow up plan: Return in about 4 weeks (around 06/14/2019) for HTN.

## 2019-05-17 NOTE — Assessment & Plan Note (Signed)
Chronic, ongoing, not at goal.  Will increase Losartan to 50 MG daily, script sent.  Continue daily BP checks and obtain BMP outpatient.  Return in 4 weeks.  Goal BP < 130/90.

## 2019-05-18 ENCOUNTER — Other Ambulatory Visit: Payer: BC Managed Care – PPO

## 2019-05-18 DIAGNOSIS — I10 Essential (primary) hypertension: Secondary | ICD-10-CM

## 2019-05-19 LAB — BASIC METABOLIC PANEL
BUN/Creatinine Ratio: 25 (ref 12–28)
BUN: 18 mg/dL (ref 8–27)
CO2: 24 mmol/L (ref 20–29)
Calcium: 8.9 mg/dL (ref 8.7–10.3)
Chloride: 100 mmol/L (ref 96–106)
Creatinine, Ser: 0.73 mg/dL (ref 0.57–1.00)
GFR calc Af Amer: 101 mL/min/{1.73_m2} (ref >59)
GFR calc non Af Amer: 87 mL/min/{1.73_m2} (ref >59)
Glucose: 92 mg/dL (ref 65–99)
Potassium: 4.5 mmol/L (ref 3.5–5.2)
Sodium: 136 mmol/L (ref 134–144)

## 2019-06-06 ENCOUNTER — Other Ambulatory Visit: Payer: Self-pay | Admitting: Nurse Practitioner

## 2019-06-06 MED ORDER — SCOPOLAMINE 1 MG/3DAYS TD PT72: 1.0000 | MEDICATED_PATCH | TRANSDERMAL | 12 refills | Status: DC

## 2019-06-06 NOTE — Progress Notes (Signed)
Request for scopolamine patch, due to going on boating trip.

## 2019-06-20 ENCOUNTER — Encounter: Payer: Self-pay | Admitting: Nurse Practitioner

## 2019-06-20 ENCOUNTER — Ambulatory Visit (INDEPENDENT_AMBULATORY_CARE_PROVIDER_SITE_OTHER): Payer: BC Managed Care – PPO | Admitting: Nurse Practitioner

## 2019-06-20 ENCOUNTER — Other Ambulatory Visit: Payer: Self-pay

## 2019-06-20 DIAGNOSIS — I10 Essential (primary) hypertension: Secondary | ICD-10-CM | POA: Diagnosis not present

## 2019-06-20 NOTE — Patient Instructions (Signed)

## 2019-06-20 NOTE — Assessment & Plan Note (Signed)
Chronic, ongoing with improvement to BP overall often at goal <130/90.  Continue current medication regimen, Losartan 50 MG, and return in 3 months for face to face visit with labs.

## 2019-06-20 NOTE — Progress Notes (Signed)
BP 130/78   Pulse 84   Temp 98.1 F (36.7 C)   LMP  (LMP Unknown)   SpO2 97%    Subjective:    Patient ID: Mary Wood, female    DOB: 1956/05/21, 63 y.o.   MRN: [id number]  HPI: Mary Wood is a 63 y.o. female  Chief Complaint  Patient presents with  . Follow-up  . Hypertension    . This visit was completed via WebEx due to the restrictions of the COVID-19 pandemic. All issues as above were discussed and addressed. Physical exam was done as above through visual confirmation on WebEx. If it was felt that the patient should be evaluated in the office, they were directed there. The patient verbally consented to this visit. . Location of the patient: work . Location of the provider: work . Those involved with this call:  . Provider: Aura DialsJolene Cannady, DNP . CMA: Wilhemena DurieBrittany Russell, CMA . Front Desk/Registration: Harriet PhoJoliza Johnson  . Time spent on call: 15 minutes with patient face to face via video conference. More than 50% of this time was spent in counseling and coordination of care. 5 minutes total spent in review of patient's record and preparation of their chart. I verified patient identity using two factors (patient name and date of birth). Patient consents verbally to being seen via telemedicine visit today.   HYPERTENSION Was increased to Losartan 50 Mg four weeks ago and reports improvement in BP numbers being noticed.  Has one episode of leg cramps, but no further episodes. Hypertension status: controlled  Satisfied with current treatment? no Duration of hypertension: chronic BP monitoring frequency:  daily BP range: 130/70 to 140/80, reports overall 130/70 range BP medication side effects:  no Medication compliance: good compliance Aspirin: no Recurrent headaches: no Visual changes: no Palpitations: no Dyspnea: no Chest pain: no Lower extremity edema: no Dizzy/lightheaded: no  Relevant past medical, surgical, family and social history reviewed and updated as  indicated. Interim medical history since our last visit reviewed. Allergies and medications reviewed and updated.  Review of Systems  Constitutional: Negative for activity change, appetite change, diaphoresis, fatigue and fever.  Respiratory: Negative for cough, chest tightness and shortness of breath.   Cardiovascular: Negative for chest pain, palpitations and leg swelling.  Gastrointestinal: Negative for abdominal distention, abdominal pain, constipation, diarrhea, nausea and vomiting.  Neurological: Negative for dizziness, syncope, weakness, light-headedness, numbness and headaches.  Psychiatric/Behavioral: Negative.     Per HPI unless specifically indicated above     Objective:    BP 130/78   Pulse 84   Temp 98.1 F (36.7 C)   LMP  (LMP Unknown)   SpO2 97%   Wt Readings from Last 3 Encounters:  04/17/19 200 lb (90.7 kg)  01/09/19 200 lb (90.7 kg)  12/28/18 200 lb (90.7 kg)    Physical Exam Vitals signs and nursing note reviewed.  Constitutional:      General: She is awake. She is not in acute distress.    Appearance: She is well-developed. She is not ill-appearing.  HENT:     Head: Normocephalic.     Right Ear: Hearing normal.     Left Ear: Hearing normal.  Eyes:     General: Lids are normal.        Right eye: No discharge.        Left eye: No discharge.     Conjunctiva/sclera: Conjunctivae normal.  Neck:     Musculoskeletal: Normal range of motion.  Cardiovascular:  Comments: Unable to auscultate due to virtual exam only  Pulmonary:     Effort: Pulmonary effort is normal. No accessory muscle usage or respiratory distress.     Comments: Unable to auscultate due to virtual exam only  Neurological:     Mental Status: She is alert and oriented to person, place, and time.  Psychiatric:        Attention and Perception: Attention normal.        Mood and Affect: Mood normal.        Behavior: Behavior normal. Behavior is cooperative.        Thought Content:  Thought content normal.        Judgment: Judgment normal.     Results for orders placed or performed in visit on 78/24/23  Basic Metabolic Panel (BMET)  Result Value Ref Range   Glucose 92 65 - 99 mg/dL   BUN 18 8 - 27 mg/dL   Creatinine, Ser 0.73 0.57 - 1.00 mg/dL   GFR calc non Af Amer 87 >59 mL/min/1.73   GFR calc Af Amer 101 >59 mL/min/1.73   BUN/Creatinine Ratio 25 12 - 28   Sodium 136 134 - 144 mmol/L   Potassium 4.5 3.5 - 5.2 mmol/L   Chloride 100 96 - 106 mmol/L   CO2 24 20 - 29 mmol/L   Calcium 8.9 8.7 - 10.3 mg/dL      Assessment & Plan:   Problem List Items Addressed This Visit      Cardiovascular and Mediastinum   Hypertension    Chronic, ongoing with improvement to BP overall often at goal <130/90.  Continue current medication regimen, Losartan 50 MG, and return in 3 months for face to face visit with labs.           I discussed the assessment and treatment plan with the patient. The patient was provided an opportunity to ask questions and all were answered. The patient agreed with the plan and demonstrated an understanding of the instructions.   The patient was advised to call back or seek an in-person evaluation if the symptoms worsen or if the condition fails to improve as anticipated.   I provided 15 minutes of time during this encounter.  Follow up plan: Return in about 3 months (around 09/20/2019) for HTN.

## 2019-08-03 ENCOUNTER — Ambulatory Visit (INDEPENDENT_AMBULATORY_CARE_PROVIDER_SITE_OTHER): Payer: BC Managed Care – PPO | Admitting: Nurse Practitioner

## 2019-08-03 ENCOUNTER — Encounter: Payer: Self-pay | Admitting: Nurse Practitioner

## 2019-08-03 ENCOUNTER — Other Ambulatory Visit: Payer: Self-pay

## 2019-08-03 VITALS — BP 138/79 | HR 79 | Temp 97.7°F | Resp 20

## 2019-08-03 DIAGNOSIS — I10 Essential (primary) hypertension: Secondary | ICD-10-CM | POA: Diagnosis not present

## 2019-08-03 MED ORDER — LOSARTAN POTASSIUM 100 MG PO TABS: 100.0000 mg | ORAL_TABLET | Freq: Every day | ORAL | 3 refills | Status: DC

## 2019-08-03 NOTE — Progress Notes (Signed)
BP 138/79 Comment: pt reported  Pulse 79   Temp 97.7 F (36.5 C)   Resp 20   LMP  (LMP Unknown)   SpO2 100%    Subjective:    Patient ID: Mary Wood, female    DOB: Nov 09, 1956, 63 y.o.   MRN: [id number]  HPI: Mary HurtDian Braid is a 63 y.o. female  Chief Complaint  Patient presents with  . Hypertension    pt states she had a head ache and swelling of her ankles last week, see my chart message from patient     . This visit was completed via FaceTime due to the restrictions of the COVID-19 pandemic. All issues as above were discussed and addressed. Physical exam was done as above through visual confirmation on Facetime. If it was felt that the patient should be evaluated in the office, they were directed there. The patient verbally consented to this visit. . Location of the patient: home . Location of the provider: home . Those involved with this call:  . Provider: Aura DialsJolene Grazia Taffe, DNP . CMA: Wilhemena DurieBrittany Russell, CMA . Front Desk/Registration: Harriet PhoJoliza Johnson  . Time spent on call: 15 minutes with patient face to face via video conference. More than 50% of this time was spent in counseling and coordination of care. 10 minutes total spent in review of patient's record and preparation of their chart.  . I verified patient identity using two factors (patient name and date of birth). Patient consents verbally to being seen via telemedicine visit today.  Losartan to 100  HYPERTENSION Had some elevations last week SBP 170 range and during this time had headaches and swelling in the ankles.  States she had ate a large amount of wings night before and wondered if this was cause of BP elevation and swelling, no further elevations since this time. Hypertension status: uncontrolled  Satisfied with current treatment? yes Duration of hypertension: chronic BP monitoring frequency:  daily BP range: 130/70 to 170/80's (on average 130-140/70-80's) BP medication side effects:  no Medication compliance:  good compliance Previous BP meds: Losartan Aspirin: no Recurrent headaches: no Visual changes: no Palpitations: no Dyspnea: no Chest pain: no Lower extremity edema: no Dizzy/lightheaded: no  Relevant past medical, surgical, family and social history reviewed and updated as indicated. Interim medical history since our last visit reviewed. Allergies and medications reviewed and updated.  Review of Systems  Constitutional: Negative for activity change, appetite change, diaphoresis, fatigue and fever.  Respiratory: Negative for cough, chest tightness and shortness of breath.   Cardiovascular: Negative for chest pain, palpitations and leg swelling.  Gastrointestinal: Negative for abdominal distention, abdominal pain, constipation, diarrhea, nausea and vomiting.  Neurological: Negative for dizziness, syncope, weakness, light-headedness, numbness and headaches.  Psychiatric/Behavioral: Negative.     Per HPI unless specifically indicated above     Objective:    BP 138/79 Comment: pt reported  Pulse 79   Temp 97.7 F (36.5 C)   Resp 20   LMP  (LMP Unknown)   SpO2 100%   Wt Readings from Last 3 Encounters:  04/17/19 200 lb (90.7 kg)  01/09/19 200 lb (90.7 kg)  12/28/18 200 lb (90.7 kg)    Physical Exam Vitals signs and nursing note reviewed.  Constitutional:      General: She is awake. She is not in acute distress.    Appearance: She is well-developed. She is not ill-appearing.  HENT:     Head: Normocephalic.     Right Ear: Hearing normal.  Left Ear: Hearing normal.  Eyes:     General: Lids are normal.        Right eye: No discharge.        Left eye: No discharge.     Conjunctiva/sclera: Conjunctivae normal.  Neck:     Musculoskeletal: Normal range of motion.  Cardiovascular:     Comments: Unable to auscultate due to virtual exam only  Pulmonary:     Effort: Pulmonary effort is normal. No accessory muscle usage or respiratory distress.     Comments: Unable to  auscultate due to virtual exam only  Neurological:     Mental Status: She is alert and oriented to person, place, and time.  Psychiatric:        Attention and Perception: Attention normal.        Mood and Affect: Mood normal.        Behavior: Behavior normal. Behavior is cooperative.        Thought Content: Thought content normal.        Judgment: Judgment normal.     Results for orders placed or performed in visit on 17/51/02  Basic Metabolic Panel (BMET)  Result Value Ref Range   Glucose 92 65 - 99 mg/dL   BUN 18 8 - 27 mg/dL   Creatinine, Ser 0.73 0.57 - 1.00 mg/dL   GFR calc non Af Amer 87 >59 mL/min/1.73   GFR calc Af Amer 101 >59 mL/min/1.73   BUN/Creatinine Ratio 25 12 - 28   Sodium 136 134 - 144 mmol/L   Potassium 4.5 3.5 - 5.2 mmol/L   Chloride 100 96 - 106 mmol/L   CO2 24 20 - 29 mmol/L   Calcium 8.9 8.7 - 10.3 mg/dL      Assessment & Plan:   Problem List Items Addressed This Visit      Cardiovascular and Mediastinum   Hypertension - Primary    Chronic, ongoing with intermittent elevations.  Increase Losartan to 100 MG daily and continue checking BP on daily basis + documenting for provider.  Will have return in 4 weeks for follow-up and obtain BMP prior to visit.      Relevant Medications   losartan (COZAAR) 100 MG tablet   Other Relevant Orders   Basic Metabolic Panel (BMET)      I discussed the assessment and treatment plan with the patient. The patient was provided an opportunity to ask questions and all were answered. The patient agreed with the plan and demonstrated an understanding of the instructions.   The patient was advised to call back or seek an in-person evaluation if the symptoms worsen or if the condition fails to improve as anticipated.   I provided 15 minutes of time during this encounter.  Follow up plan: Return in about 4 weeks (around 08/31/2019) for HTN follow-up and labs.

## 2019-08-03 NOTE — Patient Instructions (Signed)

## 2019-08-03 NOTE — Assessment & Plan Note (Signed)
Chronic, ongoing with intermittent elevations.  Increase Losartan to 100 MG daily and continue checking BP on daily basis + documenting for provider.  Will have return in 4 weeks for follow-up and obtain BMP prior to visit.

## 2019-09-07 ENCOUNTER — Other Ambulatory Visit: Payer: Self-pay

## 2019-09-07 ENCOUNTER — Encounter: Payer: Self-pay | Admitting: Nurse Practitioner

## 2019-09-07 ENCOUNTER — Ambulatory Visit (INDEPENDENT_AMBULATORY_CARE_PROVIDER_SITE_OTHER): Payer: BC Managed Care – PPO | Admitting: Nurse Practitioner

## 2019-09-07 DIAGNOSIS — I10 Essential (primary) hypertension: Secondary | ICD-10-CM

## 2019-09-07 NOTE — Assessment & Plan Note (Signed)
Chronic, ongoing with intermittent elevations remaining, but overall is improving.  Continue Losartan 100 MG daily and checking BP on daily basis + documenting for provider.  Will have return in 3 months.  If continued SBP elevation, consider addition of Amlodipine at 2.5 MG initially.

## 2019-09-07 NOTE — Progress Notes (Signed)
BP (!) 144/82   Pulse 80   Temp 97.9 F (36.6 C)   Resp 12   LMP  (LMP Unknown)   SpO2 97%    Subjective:    Patient ID: Mary Wood, female    DOB: Feb 25, 1956, 63 y.o.   MRN: [id number]  HPI: Mary Wood is a 63 y.o. female  Chief Complaint  Patient presents with  . Hypertension    . This visit was completed via Doximity due to the restrictions of the COVID-19 pandemic. All issues as above were discussed and addressed. Physical exam was done as above through visual confirmation on Doximity. If it was felt that the patient should be evaluated in the office, they were directed there. The patient verbally consented to this visit. . Location of the patient: home . Location of the provider: home . Those involved with this call:  . Provider: Marnee Guarneri, DNP . CMA: Yvonna Alanis, CMA . Front Desk/Registration: Jill Side  . Time spent on call: 15 minutes with patient face to face via video conference. More than 50% of this time was spent in counseling and coordination of care. 10 minutes total spent in review of patient's record and preparation of their chart.  . I verified patient identity using two factors (patient name and date of birth). Patient consents verbally to being seen via telemedicine visit today.    HYPERTENSION Last visit Losartan increased to 100 MG.  Had labs done at [facility] 08/07/19 with CRT 0.76, GFR 83, and K+ 4.4.   Hypertension status: stable  Satisfied with current treatment? yes Duration of hypertension: chronic BP monitoring frequency:  daily BP range: thinks her BP cuff is broken, getting high readings at home and better readings at work BP medication side effects:  no Medication compliance: good compliance Previous BP meds: Losartan Aspirin: no Recurrent headaches: no Visual changes: no Palpitations: no Dyspnea: no Chest pain: no Lower extremity edema: no Dizzy/lightheaded: no  Relevant past medical, surgical, family and social history  reviewed and updated as indicated. Interim medical history since our last visit reviewed. Allergies and medications reviewed and updated.  Review of Systems  Constitutional: Negative for activity change, appetite change, diaphoresis, fatigue and fever.  Respiratory: Negative for cough, chest tightness and shortness of breath.   Cardiovascular: Negative for chest pain, palpitations and leg swelling.  Gastrointestinal: Negative for abdominal distention, abdominal pain, constipation, diarrhea, nausea and vomiting.  Neurological: Negative for dizziness, syncope, weakness, light-headedness, numbness and headaches.  Psychiatric/Behavioral: Negative.     Per HPI unless specifically indicated above     Objective:    BP (!) 144/82   Pulse 80   Temp 97.9 F (36.6 C)   Resp 12   LMP  (LMP Unknown)   SpO2 97%   Wt Readings from Last 3 Encounters:  04/17/19 200 lb (90.7 kg)  01/09/19 200 lb (90.7 kg)  12/28/18 200 lb (90.7 kg)    Physical Exam Vitals signs and nursing note reviewed.  Constitutional:      General: She is awake. She is not in acute distress.    Appearance: She is well-developed. She is not ill-appearing.  HENT:     Head: Normocephalic.     Right Ear: Hearing normal.     Left Ear: Hearing normal.  Eyes:     General: Lids are normal.        Right eye: No discharge.        Left eye: No discharge.     Conjunctiva/sclera:  Conjunctivae normal.  Neck:     Musculoskeletal: Normal range of motion.  Pulmonary:     Effort: Pulmonary effort is normal. No accessory muscle usage or respiratory distress.  Neurological:     Mental Status: She is alert and oriented to person, place, and time.  Psychiatric:        Attention and Perception: Attention normal.        Mood and Affect: Mood normal.        Behavior: Behavior normal. Behavior is cooperative.        Thought Content: Thought content normal.        Judgment: Judgment normal.     Results for orders placed or performed  in visit on 05/18/19  Basic Metabolic Panel (BMET)  Result Value Ref Range   Glucose 92 65 - 99 mg/dL   BUN 18 8 - 27 mg/dL   Creatinine, Ser 6.01 0.57 - 1.00 mg/dL   GFR calc non Af Amer 87 >59 mL/min/1.73   GFR calc Af Amer 101 >59 mL/min/1.73   BUN/Creatinine Ratio 25 12 - 28   Sodium 136 134 - 144 mmol/L   Potassium 4.5 3.5 - 5.2 mmol/L   Chloride 100 96 - 106 mmol/L   CO2 24 20 - 29 mmol/L   Calcium 8.9 8.7 - 10.3 mg/dL      Assessment & Plan:   Problem List Items Addressed This Visit      Cardiovascular and Mediastinum   Hypertension    Chronic, ongoing with intermittent elevations remaining, but overall is improving.  Continue Losartan 100 MG daily and checking BP on daily basis + documenting for provider.  Will have return in 3 months.  If continued SBP elevation, consider addition of Amlodipine at 2.5 MG initially.         I discussed the assessment and treatment plan with the patient. The patient was provided an opportunity to ask questions and all were answered. The patient agreed with the plan and demonstrated an understanding of the instructions.   The patient was advised to call back or seek an in-person evaluation if the symptoms worsen or if the condition fails to improve as anticipated.   I provided 15 minutes of time during this encounter.  Follow up plan: Return in about 3 months (around 12/07/2019) for HTN.

## 2019-09-07 NOTE — Patient Instructions (Signed)

## 2019-09-12 ENCOUNTER — Other Ambulatory Visit: Payer: Self-pay | Admitting: Nurse Practitioner

## 2019-09-12 ENCOUNTER — Other Ambulatory Visit: Payer: BC Managed Care – PPO

## 2019-09-12 ENCOUNTER — Telehealth: Payer: Self-pay | Admitting: Nurse Practitioner

## 2019-09-12 ENCOUNTER — Other Ambulatory Visit: Payer: Self-pay

## 2019-09-12 DIAGNOSIS — R300 Dysuria: Secondary | ICD-10-CM

## 2019-09-12 LAB — UA/M W/RFLX CULTURE, ROUTINE
Bilirubin, UA: NEGATIVE
Glucose, UA: NEGATIVE
Ketones, UA: NEGATIVE
Leukocytes,UA: NEGATIVE
Nitrite, UA: NEGATIVE
Protein,UA: NEGATIVE
RBC, UA: NEGATIVE
Specific Gravity, UA: 1.015 (ref 1.005–1.030)
Urobilinogen, Ur: 0.2 mg/dL (ref 0.2–1.0)
pH, UA: 7 (ref 5.0–7.5)

## 2019-09-12 NOTE — Progress Notes (Signed)
Patient report of UTI symptoms.

## 2019-09-12 NOTE — Telephone Encounter (Signed)
Reviewed patient's recent urine result with her via telephone.  UA negative.  Discussed no need for abx at this time.  She reports some mild back pain that is improving with Tylenol and heating.  Recommend to continue this and return to office if worsening or no improvement.

## 2019-09-13 ENCOUNTER — Other Ambulatory Visit: Payer: Self-pay | Admitting: Unknown Physician Specialty

## 2019-09-13 MED ORDER — NAPROXEN 500 MG PO TABS: 500.0000 mg | ORAL_TABLET | Freq: Two times a day (BID) | ORAL | 0 refills | Status: DC

## 2019-09-25 ENCOUNTER — Ambulatory Visit: Payer: Self-pay | Admitting: Nurse Practitioner

## 2019-10-04 ENCOUNTER — Ambulatory Visit (INDEPENDENT_AMBULATORY_CARE_PROVIDER_SITE_OTHER): Payer: BC Managed Care – PPO | Admitting: Nurse Practitioner

## 2019-10-04 ENCOUNTER — Other Ambulatory Visit: Payer: Self-pay

## 2019-10-04 ENCOUNTER — Encounter: Payer: Self-pay | Admitting: Nurse Practitioner

## 2019-10-04 DIAGNOSIS — I10 Essential (primary) hypertension: Secondary | ICD-10-CM

## 2019-10-04 NOTE — Assessment & Plan Note (Signed)
Chronic, ongoing with intermittent elevations remaining, but overall is improving.  Continue Losartan 100 MG daily and checking BP on daily basis + documenting for provider.  Will have return in 4 months.  If continued SBP elevation, consider addition of Amlodipine at 2.5 MG initially.  Focus heavily on diet changes.

## 2019-10-04 NOTE — Progress Notes (Signed)
BP (!) 155/78   Pulse 74   Temp 98.1 F (36.7 C) (Oral)   Resp 14   Ht 5\' 7"  (1.702 m)   Wt 200 lb (90.7 kg)   LMP  (LMP Unknown)   SpO2 98%   BMI 31.32 kg/m    Subjective:    Patient ID: Mary Wood, female    DOB: 03/01/1956, 63 y.o.   MRN: [id number]  HPI: Mary Wood is a 63 y.o. female  Chief Complaint  Patient presents with  . Hypertension    . This visit was completed via FaceTime due to the restrictions of the COVID-19 pandemic. All issues as above were discussed and addressed. Physical exam was done as above through visual confirmation on Facetime. If it was felt that the patient should be evaluated in the office, they were directed there. The patient verbally consented to this visit. . Location of the patient: home . Location of the provider: work . Those involved with this call:  . Provider: 76, DNP . CMA: Aura Dials, CMA . Front Desk/Registration: Wilhemena Durie  . Time spent on call: 15 minutes with patient face to face via video conference. More than 50% of this time was spent in counseling and coordination of care. 10 minutes total spent in review of patient's record and preparation of their chart.  . I verified patient identity using two factors (patient name and date of birth). Patient consents verbally to being seen via telemedicine visit today.    HYPERTENSION Continues on Losartan 100 MG daily.  Was on vacation last week, eating lots of higher sodium foods.  Is starting a new natural supplement and wishes to focus on diet changes vs adding another medication at this time. Hypertension status: stable  Satisfied with current treatment? yes Duration of hypertension: chronic BP monitoring frequency:  daily BP range: 130-150/70-80 BP medication side effects:  no Medication compliance: good compliance Aspirin: no Recurrent headaches: no Visual changes: no Palpitations: no Dyspnea: no Chest pain: no Lower extremity edema: no  Dizzy/lightheaded: no  Relevant past medical, surgical, family and social history reviewed and updated as indicated. Interim medical history since our last visit reviewed. Allergies and medications reviewed and updated.  Review of Systems  Constitutional: Negative for activity change, appetite change, diaphoresis, fatigue and fever.  Respiratory: Negative for cough, chest tightness and shortness of breath.   Cardiovascular: Negative for chest pain, palpitations and leg swelling.  Gastrointestinal: Negative for abdominal distention, abdominal pain, constipation, diarrhea, nausea and vomiting.  Endocrine: Negative for cold intolerance, heat intolerance, polydipsia, polyphagia and polyuria.  Neurological: Negative for dizziness, syncope, weakness, light-headedness, numbness and headaches.  Psychiatric/Behavioral: Negative.     Per HPI unless specifically indicated above     Objective:    BP (!) 155/78   Pulse 74   Temp 98.1 F (36.7 C) (Oral)   Resp 14   Ht 5\' 7"  (1.702 m)   Wt 200 lb (90.7 kg)   LMP  (LMP Unknown)   SpO2 98%   BMI 31.32 kg/m   Wt Readings from Last 3 Encounters:  10/04/19 200 lb (90.7 kg)  04/17/19 200 lb (90.7 kg)  01/09/19 200 lb (90.7 kg)    Physical Exam Vitals signs and nursing note reviewed.  Constitutional:      General: She is awake. She is not in acute distress.    Appearance: She is well-developed. She is not ill-appearing.  HENT:     Head: Normocephalic.     Right  Ear: Hearing normal.     Left Ear: Hearing normal.  Eyes:     General: Lids are normal.        Right eye: No discharge.        Left eye: No discharge.     Conjunctiva/sclera: Conjunctivae normal.  Neck:     Musculoskeletal: Normal range of motion.  Pulmonary:     Effort: Pulmonary effort is normal. No accessory muscle usage or respiratory distress.  Neurological:     Mental Status: She is alert and oriented to person, place, and time.  Psychiatric:        Attention and  Perception: Attention normal.        Mood and Affect: Mood normal.        Behavior: Behavior normal. Behavior is cooperative.        Thought Content: Thought content normal.        Judgment: Judgment normal.    Results for orders placed or performed in visit on 09/12/19  UA/M w/rflx Culture, Routine   Specimen: Urine   URINE  Result Value Ref Range   Specific Gravity, UA 1.015 1.005 - 1.030   pH, UA 7.0 5.0 - 7.5   Color, UA Yellow Yellow   Appearance Ur Clear Clear   Leukocytes,UA Negative Negative   Protein,UA Negative Negative/Trace   Glucose, UA Negative Negative   Ketones, UA Negative Negative   RBC, UA Negative Negative   Bilirubin, UA Negative Negative   Urobilinogen, Ur 0.2 0.2 - 1.0 mg/dL   Nitrite, UA Negative Negative      Assessment & Plan:   Problem List Items Addressed This Visit      Cardiovascular and Mediastinum   Hypertension    Chronic, ongoing with intermittent elevations remaining, but overall is improving.  Continue Losartan 100 MG daily and checking BP on daily basis + documenting for provider.  Will have return in 4 months.  If continued SBP elevation, consider addition of Amlodipine at 2.5 MG initially.  Focus heavily on diet changes.         I discussed the assessment and treatment plan with the patient. The patient was provided an opportunity to ask questions and all were answered. The patient agreed with the plan and demonstrated an understanding of the instructions.   The patient was advised to call back or seek an in-person evaluation if the symptoms worsen or if the condition fails to improve as anticipated.   I provided 15 minutes of time during this encounter.  Follow up plan: Return in about 4 months (around 02/04/2020) for HTN.

## 2019-10-04 NOTE — Patient Instructions (Signed)

## 2020-01-22 ENCOUNTER — Ambulatory Visit: Payer: BC Managed Care – PPO | Admitting: Nurse Practitioner

## 2020-01-29 ENCOUNTER — Encounter: Payer: Self-pay | Admitting: Nurse Practitioner

## 2020-01-29 ENCOUNTER — Other Ambulatory Visit: Payer: Self-pay

## 2020-01-29 ENCOUNTER — Ambulatory Visit: Payer: BC Managed Care – PPO | Admitting: Nurse Practitioner

## 2020-01-29 VITALS — BP 151/86 | HR 80 | Temp 98.0°F

## 2020-01-29 DIAGNOSIS — Z23 Encounter for immunization: Secondary | ICD-10-CM | POA: Diagnosis not present

## 2020-01-29 DIAGNOSIS — E6609 Other obesity due to excess calories: Secondary | ICD-10-CM | POA: Diagnosis not present

## 2020-01-29 DIAGNOSIS — Z6833 Body mass index (BMI) 33.0-33.9, adult: Secondary | ICD-10-CM

## 2020-01-29 DIAGNOSIS — M85851 Other specified disorders of bone density and structure, right thigh: Secondary | ICD-10-CM | POA: Diagnosis not present

## 2020-01-29 DIAGNOSIS — I10 Essential (primary) hypertension: Secondary | ICD-10-CM

## 2020-01-29 MED ORDER — AMLODIPINE BESYLATE 5 MG PO TABS: 5.0000 mg | ORAL_TABLET | Freq: Every day | ORAL | 3 refills | Status: DC

## 2020-01-29 NOTE — Patient Instructions (Signed)
Pneumococcal Conjugate Vaccine (PCV13): What You Need to Know 1. Why get vaccinated? Pneumococcal conjugate vaccine (PCV13) can prevent pneumococcal disease. Pneumococcal disease refers to any illness caused by pneumococcal bacteria. These bacteria can cause many types of illnesses, including pneumonia, which is an infection of the lungs. Pneumococcal bacteria are one of the most common causes of pneumonia. Besides pneumonia, pneumococcal bacteria can also cause:  Ear infections  Sinus infections  Meningitis (infection of the tissue covering the brain and spinal cord)  Bacteremia (bloodstream infection) Anyone can get pneumococcal disease, but children under 2 years of age, people with certain medical conditions, adults 65 years or older, and cigarette smokers are at the highest risk. Most pneumococcal infections are mild. However, some can result in long-term problems, such as brain damage or hearing loss. Meningitis, bacteremia, and pneumonia caused by pneumococcal disease can be fatal. 2. PCV13 PCV13 protects against 13 types of bacteria that cause pneumococcal disease. Infants and young children usually need 4 doses of pneumococcal conjugate vaccine, at 2, 4, 6, and 12-15 months of age. In some cases, a child might need fewer than 4 doses to complete PCV13 vaccination. A dose of PCV23 vaccine is also recommended for anyone 2 years or older with certain medical conditions if they did not already receive PCV13. This vaccine may be given to adults 65 years or older based on discussions between the patient and health care provider. 3. Talk with your health care provider Tell your vaccine provider if the person getting the vaccine:  Has had an allergic reaction after a previous dose of PCV13, to an earlier pneumococcal conjugate vaccine known as PCV7, or to any vaccine containing diphtheria toxoid (for example, DTaP), or has any severe, life-threatening allergies.  In some cases, your health  care provider may decide to postpone PCV13 vaccination to a future visit. People with minor illnesses, such as a cold, may be vaccinated. People who are moderately or severely ill should usually wait until they recover before getting PCV13. Your health care provider can give you more information. 4. Risks of a vaccine reaction  Redness, swelling, pain, or tenderness where the shot is given, and fever, loss of appetite, fussiness (irritability), feeling tired, headache, and chills can happen after PCV13. Young children may be at increased risk for seizures caused by fever after PCV13 if it is administered at the same time as inactivated influenza vaccine. Ask your health care provider for more information. People sometimes faint after medical procedures, including vaccination. Tell your provider if you feel dizzy or have vision changes or ringing in the ears. As with any medicine, there is a very remote chance of a vaccine causing a severe allergic reaction, other serious injury, or death. 5. What if there is a serious problem? An allergic reaction could occur after the vaccinated person leaves the clinic. If you see signs of a severe allergic reaction (hives, swelling of the face and throat, difficulty breathing, a fast heartbeat, dizziness, or weakness), call 9-1-1 and get the person to the nearest hospital. For other signs that concern you, call your health care provider. Adverse reactions should be reported to the Vaccine Adverse Event Reporting System (VAERS). Your health care provider will usually file this report, or you can do it yourself. Visit the VAERS website at www.vaers.hhs.gov or call 1-800-822-7967. VAERS is only for reporting reactions, and VAERS staff do not give medical advice. 6. The National Vaccine Injury Compensation Program The National Vaccine Injury Compensation Program (VICP) is a federal program   that was created to compensate people who may have been injured by certain  vaccines. Visit the VICP website at www.hrsa.gov/vaccinecompensation or call 1-800-338-2382 to learn about the program and about filing a claim. There is a time limit to file a claim for compensation. 7. How can I learn more?  Ask your health care provider.  Call your local or state health department.  Contact the Centers for Disease Control and Prevention (CDC): ? Call 1-800-232-4636 (1-800-CDC-INFO) or ? Visit CDC's website at www.cdc.gov/vaccines Vaccine Information Statement PCV13 Vaccine (10/12/2018) This information is not intended to replace advice given to you by your health care provider. Make sure you discuss any questions you have with your health care provider. Document Revised: 03/21/2019 Document Reviewed: 07/12/2018 Elsevier Patient Education  2020 Elsevier Inc.   

## 2020-01-29 NOTE — Assessment & Plan Note (Signed)
Chronic, ongoing with intermittent elevations remaining.  Continue Losartan 100 MG daily and checking BP on daily basis + documenting for provider.  Will add on Amlodipine 5 MG daily, script sent.  Focus heavily on diet changes.  Return in 4 weeks.  BMP today.

## 2020-01-29 NOTE — Assessment & Plan Note (Signed)
Recommend continued focus on healthy diet choices and regular physical activity (30 minutes 5 days a week).  

## 2020-01-29 NOTE — Assessment & Plan Note (Signed)
Ongoing. Will plan on physical in April and repeat DEXA in October 2021.  Continue CA+ and Vit D daily.

## 2020-01-29 NOTE — Progress Notes (Signed)
BP (!) 151/86   Pulse 80   Temp 98 F (36.7 C) (Oral)   LMP  (LMP Unknown)   SpO2 96%    Subjective:    Patient ID: Mary Wood, female    DOB: September 11, 1956, 64 y.o.   MRN: [id number]  HPI: Mary Wood is a 64 y.o. female  Chief Complaint  Patient presents with  . Hypertension   HYPERTENSION Continues on Losartan 100 MG + diet focus.  Family history of high BP, brother/sister/mother.   Hypertension status: uncontrolled Satisfied with current treatment? yes Duration of hypertension: chronic BP monitoring frequency:  daily BP range: mid-140/80's BP medication side effects:  no Medication compliance: good compliance Previous BP meds: Losartan Aspirin: no Recurrent headaches: no Visual changes: no Palpitations: no Dyspnea: no Chest pain: no Lower extremity edema: no Dizzy/lightheaded: no   OSTEOPENIA Noted on DEXA in October 2016 with T -2.4. Satisfied with current treatment?: yes Adequate calcium & vitamin D: yes Intolerance to bisphosphonates:not used Weight bearing exercises: yes  Relevant past medical, surgical, family and social history reviewed and updated as indicated. Interim medical history since our last visit reviewed. Allergies and medications reviewed and updated.  Review of Systems  Constitutional: Negative for activity change, appetite change, diaphoresis, fatigue and fever.  Respiratory: Negative for cough, chest tightness and shortness of breath.   Cardiovascular: Negative for chest pain, palpitations and leg swelling.  Gastrointestinal: Negative.   Neurological: Negative.   Psychiatric/Behavioral: Negative.     Per HPI unless specifically indicated above     Objective:    BP (!) 151/86   Pulse 80   Temp 98 F (36.7 C) (Oral)   LMP  (LMP Unknown)   SpO2 96%   Wt Readings from Last 3 Encounters:  10/04/19 200 lb (90.7 kg)  04/17/19 200 lb (90.7 kg)  01/09/19 200 lb (90.7 kg)    Physical Exam Vitals and nursing note reviewed.    Constitutional:      General: She is awake. She is not in acute distress.    Appearance: She is well-developed and overweight. She is not ill-appearing.  HENT:     Head: Normocephalic.     Right Ear: Hearing normal.     Left Ear: Hearing normal.  Eyes:     General: Lids are normal.        Right eye: No discharge.        Left eye: No discharge.     Conjunctiva/sclera: Conjunctivae normal.     Pupils: Pupils are equal, round, and reactive to light.  Neck:     Vascular: No carotid bruit.  Cardiovascular:     Rate and Rhythm: Normal rate and regular rhythm.     Heart sounds: Normal heart sounds. No murmur. No gallop.   Pulmonary:     Effort: Pulmonary effort is normal. No accessory muscle usage or respiratory distress.     Breath sounds: Normal breath sounds.  Abdominal:     General: Bowel sounds are normal.     Palpations: Abdomen is soft.  Musculoskeletal:     Cervical back: Normal range of motion and neck supple.     Right lower leg: No edema.     Left lower leg: No edema.  Skin:    General: Skin is warm and dry.  Neurological:     Mental Status: She is alert and oriented to person, place, and time.  Psychiatric:        Attention and Perception: Attention normal.  Mood and Affect: Mood normal.        Speech: Speech normal.        Behavior: Behavior normal. Behavior is cooperative.     Results for orders placed or performed in visit on 09/12/19  UA/M w/rflx Culture, Routine   Specimen: Urine   URINE  Result Value Ref Range   Specific Gravity, UA 1.015 1.005 - 1.030   pH, UA 7.0 5.0 - 7.5   Color, UA Yellow Yellow   Appearance Ur Clear Clear   Leukocytes,UA Negative Negative   Protein,UA Negative Negative/Trace   Glucose, UA Negative Negative   Ketones, UA Negative Negative   RBC, UA Negative Negative   Bilirubin, UA Negative Negative   Urobilinogen, Ur 0.2 0.2 - 1.0 mg/dL   Nitrite, UA Negative Negative      Assessment & Plan:   Problem List Items  Addressed This Visit      Cardiovascular and Mediastinum   Hypertension - Primary    Chronic, ongoing with intermittent elevations remaining.  Continue Losartan 100 MG daily and checking BP on daily basis + documenting for provider.  Will add on Amlodipine 5 MG daily, script sent.  Focus heavily on diet changes.  Return in 4 weeks.  BMP today.      Relevant Medications   amLODipine (NORVASC) 5 MG tablet   Other Relevant Orders   Basic Metabolic Panel (BMET)     Musculoskeletal and Integument   Osteopenia    Ongoing. Will plan on physical in April and repeat DEXA in October 2021.  Continue CA+ and Vit D daily.        Other   Obesity    Recommend continued focus on healthy diet choices and regular physical activity (30 minutes 5 days a week).        Other Visit Diagnoses    Need for pneumococcal vaccination       Relevant Orders   Pneumococcal conjugate vaccine 13-valent IM (Completed)       Follow up plan: Return in about 4 weeks (around 02/26/2020) for HTN.

## 2020-01-30 LAB — BASIC METABOLIC PANEL
BUN/Creatinine Ratio: 24 (ref 12–28)
BUN: 15 mg/dL (ref 8–27)
CO2: 23 mmol/L (ref 20–29)
Calcium: 10 mg/dL (ref 8.7–10.3)
Chloride: 104 mmol/L (ref 96–106)
Creatinine, Ser: 0.63 mg/dL (ref 0.57–1.00)
GFR calc Af Amer: 109 mL/min/{1.73_m2} (ref >59)
GFR calc non Af Amer: 94 mL/min/{1.73_m2} (ref >59)
Glucose: 102 mg/dL — ABNORMAL HIGH (ref 65–99)
Potassium: 4.2 mmol/L (ref 3.5–5.2)
Sodium: 143 mmol/L (ref 134–144)

## 2020-02-28 ENCOUNTER — Encounter: Payer: Self-pay | Admitting: Nurse Practitioner

## 2020-02-29 ENCOUNTER — Telehealth (INDEPENDENT_AMBULATORY_CARE_PROVIDER_SITE_OTHER): Payer: BC Managed Care – PPO | Admitting: Nurse Practitioner

## 2020-02-29 ENCOUNTER — Encounter: Payer: Self-pay | Admitting: Nurse Practitioner

## 2020-02-29 VITALS — BP 118/54 | HR 95 | Temp 98.1°F | Resp 16

## 2020-02-29 DIAGNOSIS — I10 Essential (primary) hypertension: Secondary | ICD-10-CM | POA: Diagnosis not present

## 2020-02-29 DIAGNOSIS — Z1231 Encounter for screening mammogram for malignant neoplasm of breast: Secondary | ICD-10-CM | POA: Diagnosis not present

## 2020-02-29 NOTE — Patient Instructions (Signed)
DASH Eating Plan DASH stands for "Dietary Approaches to Stop Hypertension." The DASH eating plan is a healthy eating plan that has been shown to reduce high blood pressure (hypertension). It may also reduce your risk for type 2 diabetes, heart disease, and stroke. The DASH eating plan may also help with weight loss. What are tips for following this plan?  General guidelines  Avoid eating more than 2,300 mg (milligrams) of salt (sodium) a day. If you have hypertension, you may need to reduce your sodium intake to 1,500 mg a day.  Limit alcohol intake to no more than 1 drink a day for nonpregnant women and 2 drinks a day for men. One drink equals 12 oz of beer, 5 oz of wine, or 1 oz of hard liquor.  Work with your health care provider to maintain a healthy body weight or to lose weight. Ask what an ideal weight is for you.  Get at least 30 minutes of exercise that causes your heart to beat faster (aerobic exercise) most days of the week. Activities may include walking, swimming, or biking.  Work with your health care provider or diet and nutrition specialist (dietitian) to adjust your eating plan to your individual calorie needs. Reading food labels   Check food labels for the amount of sodium per serving. Choose foods with less than 5 percent of the Daily Value of sodium. Generally, foods with less than 300 mg of sodium per serving fit into this eating plan.  To find whole grains, look for the word "whole" as the first word in the ingredient list. Shopping  Buy products labeled as "low-sodium" or "no salt added."  Buy fresh foods. Avoid canned foods and premade or frozen meals. Cooking  Avoid adding salt when cooking. Use salt-free seasonings or herbs instead of table salt or sea salt. Check with your health care provider or pharmacist before using salt substitutes.  Do not fry foods. Cook foods using healthy methods such as baking, boiling, grilling, and broiling instead.  Cook with  heart-healthy oils, such as olive, canola, soybean, or sunflower oil. Meal planning  Eat a balanced diet that includes: ? 5 or more servings of fruits and vegetables each day. At each meal, try to fill half of your plate with fruits and vegetables. ? Up to 6-8 servings of whole grains each day. ? Less than 6 oz of lean meat, poultry, or fish each day. A 3-oz serving of meat is about the same size as a deck of cards. One egg equals 1 oz. ? 2 servings of low-fat dairy each day. ? A serving of nuts, seeds, or beans 5 times each week. ? Heart-healthy fats. Healthy fats called Omega-3 fatty acids are found in foods such as flaxseeds and coldwater fish, like sardines, salmon, and mackerel.  Limit how much you eat of the following: ? Canned or prepackaged foods. ? Food that is high in trans fat, such as fried foods. ? Food that is high in saturated fat, such as fatty meat. ? Sweets, desserts, sugary drinks, and other foods with added sugar. ? Full-fat dairy products.  Do not salt foods before eating.  Try to eat at least 2 vegetarian meals each week.  Eat more home-cooked food and less restaurant, buffet, and fast food.  When eating at a restaurant, ask that your food be prepared with less salt or no salt, if possible. What foods are recommended? The items listed may not be a complete list. Talk with your dietitian about   what dietary choices are best for you. Grains Whole-grain or whole-wheat bread. Whole-grain or whole-wheat pasta. Brown rice. Oatmeal. Quinoa. Bulgur. Whole-grain and low-sodium cereals. Pita bread. Low-fat, low-sodium crackers. Whole-wheat flour tortillas. Vegetables Fresh or frozen vegetables (raw, steamed, roasted, or grilled). Low-sodium or reduced-sodium tomato and vegetable juice. Low-sodium or reduced-sodium tomato sauce and tomato paste. Low-sodium or reduced-sodium canned vegetables. Fruits All fresh, dried, or frozen fruit. Canned fruit in natural juice (without  added sugar). Meat and other protein foods Skinless chicken or turkey. Ground chicken or turkey. Pork with fat trimmed off. Fish and seafood. Egg whites. Dried beans, peas, or lentils. Unsalted nuts, nut butters, and seeds. Unsalted canned beans. Lean cuts of beef with fat trimmed off. Low-sodium, lean deli meat. Dairy Low-fat (1%) or fat-free (skim) milk. Fat-free, low-fat, or reduced-fat cheeses. Nonfat, low-sodium ricotta or cottage cheese. Low-fat or nonfat yogurt. Low-fat, low-sodium cheese. Fats and oils Soft margarine without trans fats. Vegetable oil. Low-fat, reduced-fat, or light mayonnaise and salad dressings (reduced-sodium). Canola, safflower, olive, soybean, and sunflower oils. Avocado. Seasoning and other foods Herbs. Spices. Seasoning mixes without salt. Unsalted popcorn and pretzels. Fat-free sweets. What foods are not recommended? The items listed may not be a complete list. Talk with your dietitian about what dietary choices are best for you. Grains Baked goods made with fat, such as croissants, muffins, or some breads. Dry pasta or rice meal packs. Vegetables Creamed or fried vegetables. Vegetables in a cheese sauce. Regular canned vegetables (not low-sodium or reduced-sodium). Regular canned tomato sauce and paste (not low-sodium or reduced-sodium). Regular tomato and vegetable juice (not low-sodium or reduced-sodium). Pickles. Olives. Fruits Canned fruit in a light or heavy syrup. Fried fruit. Fruit in cream or butter sauce. Meat and other protein foods Fatty cuts of meat. Ribs. Fried meat. Bacon. Sausage. Bologna and other processed lunch meats. Salami. Fatback. Hotdogs. Bratwurst. Salted nuts and seeds. Canned beans with added salt. Canned or smoked fish. Whole eggs or egg yolks. Chicken or turkey with skin. Dairy Whole or 2% milk, cream, and half-and-half. Whole or full-fat cream cheese. Whole-fat or sweetened yogurt. Full-fat cheese. Nondairy creamers. Whipped toppings.  Processed cheese and cheese spreads. Fats and oils Butter. Stick margarine. Lard. Shortening. Ghee. Bacon fat. Tropical oils, such as coconut, palm kernel, or palm oil. Seasoning and other foods Salted popcorn and pretzels. Onion salt, garlic salt, seasoned salt, table salt, and sea salt. Worcestershire sauce. Tartar sauce. Barbecue sauce. Teriyaki sauce. Soy sauce, including reduced-sodium. Steak sauce. Canned and packaged gravies. Fish sauce. Oyster sauce. Cocktail sauce. Horseradish that you find on the shelf. Ketchup. Mustard. Meat flavorings and tenderizers. Bouillon cubes. Hot sauce and Tabasco sauce. Premade or packaged marinades. Premade or packaged taco seasonings. Relishes. Regular salad dressings. Where to find more information:  National Heart, Lung, and Blood Institute: www.nhlbi.nih.gov  American Heart Association: www.heart.org Summary  The DASH eating plan is a healthy eating plan that has been shown to reduce high blood pressure (hypertension). It may also reduce your risk for type 2 diabetes, heart disease, and stroke.  With the DASH eating plan, you should limit salt (sodium) intake to 2,300 mg a day. If you have hypertension, you may need to reduce your sodium intake to 1,500 mg a day.  When on the DASH eating plan, aim to eat more fresh fruits and vegetables, whole grains, lean proteins, low-fat dairy, and heart-healthy fats.  Work with your health care provider or diet and nutrition specialist (dietitian) to adjust your eating plan to your   individual calorie needs. This information is not intended to replace advice given to you by your health care provider. Make sure you discuss any questions you have with your health care provider. Document Revised: 11/12/2017 Document Reviewed: 11/23/2016 Elsevier Patient Education  2020 [employer]  

## 2020-02-29 NOTE — Progress Notes (Signed)
BP (!) 118/54   Pulse 95   Temp 98.1 F (36.7 C)   Resp 16   LMP  (LMP Unknown)   SpO2 98%    Subjective:    Patient ID: Mary Wood, female    DOB: December 05, 1956, 64 y.o.   MRN: [id number]  HPI: Mary Wood is a 64 y.o. female  Chief Complaint  Patient presents with  . Hypertension    4 week f/up    . This visit was completed via MyChart due to the restrictions of the COVID-19 pandemic. All issues as above were discussed and addressed. Physical exam was done as above through visual confirmation on MyChart. If it was felt that the patient should be evaluated in the office, they were directed there. The patient verbally consented to this visit. . Location of the patient: home . Location of the provider: home . Those involved with this call:  . Provider: Aura Dials, DNP . CMA: Wilhemena Durie, CMA . Front Desk/Registration: Adela Ports  . Time spent on call: 15 minutes with patient face to face via video conference. More than 50% of this time was spent in counseling and coordination of care. 10 minutes total spent in review of patient's record and preparation of their chart.  . I verified patient identity using two factors (patient name and date of birth). Patient consents verbally to being seen via telemedicine visit today.    HYPERTENSION Continues on Losartan 100 MG + last visit added Amlodipine 5 MG.  BP levels have much improved with this regimen, more consistently below goal.  No ADR with Amlodipine.  44 days until retirement.   Hypertension status: stable  Satisfied with current treatment? yes Duration of hypertension: chronic BP monitoring frequency:  daily BP range: 125/74 to 142/79 BP medication side effects:  no Medication compliance: good compliance Previous BP meds: Amlodipine and Losartan Aspirin: no Recurrent headaches: no Visual changes: no Palpitations: no Dyspnea: no Chest pain: no Lower extremity edema: no Dizzy/lightheaded: no  Relevant  past medical, surgical, family and social history reviewed and updated as indicated. Interim medical history since our last visit reviewed. Allergies and medications reviewed and updated.  Review of Systems  Constitutional: Negative for activity change, appetite change, diaphoresis, fatigue and fever.  Respiratory: Negative for cough, chest tightness and shortness of breath.   Cardiovascular: Negative for chest pain, palpitations and leg swelling.  Gastrointestinal: Negative.   Neurological: Negative.   Psychiatric/Behavioral: Negative.     Per HPI unless specifically indicated above     Objective:    BP (!) 118/54   Pulse 95   Temp 98.1 F (36.7 C)   Resp 16   LMP  (LMP Unknown)   SpO2 98%   Wt Readings from Last 3 Encounters:  10/04/19 200 lb (90.7 kg)  04/17/19 200 lb (90.7 kg)  01/09/19 200 lb (90.7 kg)    Physical Exam Vitals and nursing note reviewed.  Constitutional:      General: She is awake. She is not in acute distress.    Appearance: She is well-developed. She is not ill-appearing.  HENT:     Head: Normocephalic.     Right Ear: Hearing normal.     Left Ear: Hearing normal.  Eyes:     General: Lids are normal.        Right eye: No discharge.        Left eye: No discharge.     Conjunctiva/sclera: Conjunctivae normal.  Pulmonary:     Effort:  Pulmonary effort is normal. No accessory muscle usage or respiratory distress.  Musculoskeletal:     Cervical back: Normal range of motion.  Neurological:     Mental Status: She is alert and oriented to person, place, and time.  Psychiatric:        Attention and Perception: Attention normal.        Mood and Affect: Mood normal.        Behavior: Behavior normal. Behavior is cooperative.        Thought Content: Thought content normal.        Judgment: Judgment normal.     Results for orders placed or performed in visit on 09/81/19  Basic Metabolic Panel (BMET)  Result Value Ref Range   Glucose 102 (H) 65 - 99  mg/dL   BUN 15 8 - 27 mg/dL   Creatinine, Ser 0.63 0.57 - 1.00 mg/dL   GFR calc non Af Amer 94 >59 mL/min/1.73   GFR calc Af Amer 109 >59 mL/min/1.73   BUN/Creatinine Ratio 24 12 - 28   Sodium 143 134 - 144 mmol/L   Potassium 4.2 3.5 - 5.2 mmol/L   Chloride 104 96 - 106 mmol/L   CO2 23 20 - 29 mmol/L   Calcium 10.0 8.7 - 10.3 mg/dL      Assessment & Plan:   Problem List Items Addressed This Visit      Cardiovascular and Mediastinum   Hypertension - Primary    Chronic, stable with improvement in BP.  More consistently below goal at this time.  Will continue current medication regimen and adjust as needed.  Discussed with her that if lower readings noted after retirement, may consider discontinuation of Amlodipine.  Plan on follow-up in 6 months for annual physical.       Other Visit Diagnoses    Breast cancer screening by mammogram       Mammogram order.   Relevant Orders   MM DIGITAL SCREENING BILATERAL      I discussed the assessment and treatment plan with the patient. The patient was provided an opportunity to ask questions and all were answered. The patient agreed with the plan and demonstrated an understanding of the instructions.   The patient was advised to call back or seek an in-person evaluation if the symptoms worsen or if the condition fails to improve as anticipated.   I provided 15+ minutes of time during this encounter.  Follow up plan: Return in about 6 months (around 08/31/2020) for Annual physical -- will need DEXA ordered.

## 2020-02-29 NOTE — Assessment & Plan Note (Signed)
Chronic, stable with improvement in BP.  More consistently below goal at this time.  Will continue current medication regimen and adjust as needed.  Discussed with her that if lower readings noted after retirement, may consider discontinuation of Amlodipine.  Plan on follow-up in 6 months for annual physical.

## 2020-03-06 ENCOUNTER — Telehealth: Payer: Self-pay | Admitting: Nurse Practitioner

## 2020-03-06 NOTE — Telephone Encounter (Signed)
Called pt to let her know that she would have to contact the office that she is going to to schedule they will not contact her, no answer, left vm   Copied from CRM #[id number]. Topic: Referral - Status >> Mar 06, 2020  8:38 AM Baldo Daub L wrote: Reason for CRM:   Pt calling states that at last visit Jolene told her that she would be putting in a referral for a mammogram, but hasn't heard anything and is calling to check on that.

## 2020-03-27 ENCOUNTER — Encounter: Payer: Self-pay | Admitting: Nurse Practitioner

## 2020-03-29 ENCOUNTER — Encounter: Payer: Self-pay | Admitting: Nurse Practitioner

## 2020-04-10 ENCOUNTER — Other Ambulatory Visit: Payer: Self-pay | Admitting: Nurse Practitioner

## 2020-04-10 DIAGNOSIS — Z1231 Encounter for screening mammogram for malignant neoplasm of breast: Secondary | ICD-10-CM

## 2020-04-12 ENCOUNTER — Ambulatory Visit
Admission: RE | Admit: 2020-04-12 | Discharge: 2020-04-12 | Disposition: A | Discharge status: Home / Self Care | Payer: BC Managed Care – PPO | Source: Ambulatory Visit | Attending: Nurse Practitioner | Admitting: Nurse Practitioner

## 2020-04-12 DIAGNOSIS — Z1231 Encounter for screening mammogram for malignant neoplasm of breast: Secondary | ICD-10-CM | POA: Diagnosis present

## 2020-04-23 ENCOUNTER — Other Ambulatory Visit: Payer: Self-pay | Admitting: Nurse Practitioner

## 2020-04-23 MED ORDER — AMLODIPINE BESYLATE 5 MG PO TABS: 5.0000 mg | ORAL_TABLET | Freq: Every day | ORAL | 4 refills | Status: DC

## 2020-08-05 ENCOUNTER — Other Ambulatory Visit: Payer: Self-pay | Admitting: Nurse Practitioner

## 2020-08-05 MED ORDER — LOSARTAN POTASSIUM 100 MG PO TABS: 100.0000 mg | ORAL_TABLET | Freq: Every day | ORAL | 4 refills | Status: DC

## 2020-08-26 ENCOUNTER — Encounter: Payer: Self-pay | Admitting: Nurse Practitioner

## 2020-08-26 ENCOUNTER — Other Ambulatory Visit: Payer: Self-pay

## 2020-08-26 ENCOUNTER — Ambulatory Visit (INDEPENDENT_AMBULATORY_CARE_PROVIDER_SITE_OTHER): Payer: Medicare PPO | Admitting: Nurse Practitioner

## 2020-08-26 VITALS — BP 125/80 | HR 71 | Temp 98.2°F | Ht 67.2 in | Wt 203.0 lb

## 2020-08-26 DIAGNOSIS — E6609 Other obesity due to excess calories: Secondary | ICD-10-CM

## 2020-08-26 DIAGNOSIS — M85851 Other specified disorders of bone density and structure, right thigh: Secondary | ICD-10-CM | POA: Diagnosis not present

## 2020-08-26 DIAGNOSIS — Z1231 Encounter for screening mammogram for malignant neoplasm of breast: Secondary | ICD-10-CM

## 2020-08-26 DIAGNOSIS — I10 Essential (primary) hypertension: Secondary | ICD-10-CM

## 2020-08-26 DIAGNOSIS — Z0000 Encounter for general adult medical examination without abnormal findings: Secondary | ICD-10-CM

## 2020-08-26 DIAGNOSIS — E7800 Pure hypercholesterolemia, unspecified: Secondary | ICD-10-CM | POA: Diagnosis not present

## 2020-08-26 DIAGNOSIS — Z6831 Body mass index (BMI) 31.0-31.9, adult: Secondary | ICD-10-CM

## 2020-08-26 DIAGNOSIS — Z23 Encounter for immunization: Secondary | ICD-10-CM | POA: Diagnosis not present

## 2020-08-26 DIAGNOSIS — E782 Mixed hyperlipidemia: Secondary | ICD-10-CM | POA: Insufficient documentation

## 2020-08-26 NOTE — Assessment & Plan Note (Signed)
BMI 31.61 today.  Recommended eating smaller high protein, low fat meals more frequently and exercising 30 mins a day 5 times a week with a goal of 10-15lb weight loss in the next 3 months. Patient voiced their understanding and motivation to adhere to these recommendations.

## 2020-08-26 NOTE — Assessment & Plan Note (Addendum)
Chronic, stable with BP at goal.  More consistently below goal at this time.  Will continue current medication regimen and adjust as needed.  Discussed with her that if lower readings noted at home, may consider discontinuation of Amlodipine.  CMP, TSH, and CBC today.  Continue to monitor BP at home regularly and focus on DASH diet.  Plan on follow-up in 6 months.

## 2020-08-26 NOTE — Assessment & Plan Note (Signed)
Ongoing.  Continue CA+ and Vit D daily.  Will plan on repeat DEXA in April 2022, along with her mammogram.

## 2020-08-26 NOTE — Progress Notes (Addendum)
BP 125/80 (BP Location: Left Arm, Cuff Size: Normal)   Pulse 71   Temp 98.2 F (36.8 C) (Oral)   Ht 5' 7.2" (1.707 m)   Wt 203 lb (92.1 kg)   LMP  (LMP Unknown)   SpO2 96%   BMI 31.61 kg/m    Subjective:    Patient ID: Mary Wood, female    DOB: Jul 24, 1956, 64 y.o.   MRN: [id number]  HPI: Mary Wood is a 64 y.o. female presenting on 08/26/2020 for comprehensive medical examination. Current medical complaints include:none  She currently lives with: husband Menopausal Symptoms: no   HYPERTENSION Continues on Losartan 100 MG and Amlodipine 5 MG daily. Hypertension status: controlled  Satisfied with current treatment? yes Duration of hypertension: chronic BP monitoring frequency:  rarely BP range: 130-140/80 range BP medication side effects:  no Medication compliance: good compliance Aspirin: no Recurrent headaches: no Visual changes: no Palpitations: no Dyspnea: no Chest pain: no Lower extremity edema: no Dizzy/lightheaded: no  The 10-year ASCVD risk score Denman George DC Jr., et al., 2013) is: 7.6%   Values used to calculate the score:     Age: 23 years     Sex: Female     Is Non-Hispanic African American: No     Diabetic: No     Tobacco smoker: No     Systolic Blood Pressure: 125 mmHg     Is BP treated: Yes     HDL Cholesterol: 59 mg/dL     Total Cholesterol: 192 mg/dL  OSTEOPENIA Continues on daily supplements with last DEXA in 2016 with osteopenia noted, T score -2.4. Satisfied with current treatment?: yes Adequate calcium & vitamin D: yes Weight bearing exercises: yes   Depression Screen done today and results listed below:  Depression screen [facility] 2/9 08/26/2020 01/09/2019 10/02/2015  Decreased Interest 0 0 1  Down, Depressed, Hopeless 0 0 1  PHQ - 2 Score 0 0 2  Altered sleeping - 0 1  Tired, decreased energy - 0 1  Change in appetite - 0 0  Feeling bad or failure about yourself  - 0 0  Trouble concentrating - 0 0  Moving slowly or fidgety/restless - 0 0    Suicidal thoughts - 0 0  PHQ-9 Score - 0 4  Difficult doing work/chores - Not difficult at all -    The patient does not have a history of falls. I did not complete a risk assessment for falls. A plan of care for falls was not documented.   Past Medical History:  Past Medical History:  Diagnosis Date  . Osteoporosis     Surgical History:  Past Surgical History:  Procedure Laterality Date  . GANGLION CYST EXCISION Left 1994  . JOINT REPLACEMENT Left 07/05/2011   elbow    Medications:  Current Outpatient Medications on File Prior to Visit  Medication Sig  . acetaminophen (TYLENOL) 650 MG CR tablet Take 650 mg by mouth every 8 (eight) hours as needed for pain.  Marland Kitchen amLODipine (NORVASC) 5 MG tablet Take 1 tablet (5 mg total) by mouth daily.  . Calcium Citrate-Vitamin D (CALCIUM + D PO) Take 500 mg by mouth daily.  . cholecalciferol (VITAMIN D) 1000 UNITS tablet Take 1,000 Units by mouth daily.  Marland Kitchen ibuprofen (ADVIL,MOTRIN) 800 MG tablet Take 800 mg by mouth every 6 (six) hours as needed.  Marland Kitchen losartan (COZAAR) 100 MG tablet Take 1 tablet (100 mg total) by mouth daily.  . Misc Natural Products (GLUCOSAMINE CHONDROITIN ADV PO) Take  by mouth.  . naproxen (NAPROSYN) 500 MG tablet Take 1 tablet (500 mg total) by mouth 2 (two) times daily with a meal.   No current facility-administered medications on file prior to visit.    Allergies:  Allergies  Allergen Reactions  . Cat Hair Extract     Stuffy nose itchy eyes    Social History:  Social History   Socioeconomic History  . Marital status: Married    Spouse name: Not on file  . Number of children: Not on file  . Years of education: Not on file  . Highest education level: Not on file  Occupational History  . Not on file  Tobacco Use  . Smoking status: Never Smoker  . Smokeless tobacco: Never Used  Vaping Use  . Vaping Use: Never used  Substance and Sexual Activity  . Alcohol use: Yes    Alcohol/week: 0.0 standard drinks     Comment: on occasion  . Drug use: No  . Sexual activity: Yes  Other Topics Concern  . Not on file  Social History Narrative  . Not on file   Social Determinants of Health   Financial Resource Strain:   . Difficulty of Paying Living Expenses: Not on file  Food Insecurity:   . Worried About Programme researcher, broadcasting/film/video in the Last Year: Not on file  . Ran Out of Food in the Last Year: Not on file  Transportation Needs:   . Lack of Transportation (Medical): Not on file  . Lack of Transportation (Non-Medical): Not on file  Physical Activity:   . Days of Exercise per Week: Not on file  . Minutes of Exercise per Session: Not on file  Stress:   . Feeling of Stress : Not on file  Social Connections:   . Frequency of Communication with Friends and Family: Not on file  . Frequency of Social Gatherings with Friends and Family: Not on file  . Attends Religious Services: Not on file  . Active Member of Clubs or Organizations: Not on file  . Attends Banker Meetings: Not on file  . Marital Status: Not on file  Intimate Partner Violence:   . Fear of Current or Ex-Partner: Not on file  . Emotionally Abused: Not on file  . Physically Abused: Not on file  . Sexually Abused: Not on file   Social History   Tobacco Use  Smoking Status Never Smoker  Smokeless Tobacco Never Used   Social History   Substance and Sexual Activity  Alcohol Use Yes  . Alcohol/week: 0.0 standard drinks   Comment: on occasion    Family History:  Family History  Problem Relation Age of Onset  . Heart disease Mother   . Hypertension Mother   . Depression Mother   . Alcohol abuse Father   . COPD Father   . Alcohol abuse Brother   . Cancer Brother        colon  . Hyperthyroidism Sister   . Hypertension Sister   . Cancer Maternal Grandmother   . Heart disease Maternal Grandfather   . Hypertension Brother   . Depression Brother   . Osteoporosis Brother     Past medical history, surgical  history, medications, allergies, family history and social history reviewed with patient today and changes made to appropriate areas of the chart.   Review of Systems - negative All other ROS negative except what is listed above and in the HPI.      Objective:  BP 125/80 (BP Location: Left Arm, Cuff Size: Normal)   Pulse 71   Temp 98.2 F (36.8 C) (Oral)   Ht 5' 7.2" (1.707 m)   Wt 203 lb (92.1 kg)   LMP  (LMP Unknown)   SpO2 96%   BMI 31.61 kg/m   Wt Readings from Last 3 Encounters:  08/26/20 203 lb (92.1 kg)  10/04/19 200 lb (90.7 kg)  04/17/19 200 lb (90.7 kg)    Physical Exam Vitals and nursing note reviewed.  Constitutional:      General: She is awake. She is not in acute distress.    Appearance: She is well-developed. She is not ill-appearing.  HENT:     Head: Normocephalic and atraumatic.     Right Ear: Hearing, tympanic membrane, ear canal and external ear normal. No drainage.     Left Ear: Hearing, tympanic membrane, ear canal and external ear normal. No drainage.     Nose: Nose normal.     Right Sinus: No maxillary sinus tenderness or frontal sinus tenderness.     Left Sinus: No maxillary sinus tenderness or frontal sinus tenderness.     Mouth/Throat:     Mouth: Mucous membranes are moist.     Pharynx: Oropharynx is clear. Uvula midline. No pharyngeal swelling, oropharyngeal exudate or posterior oropharyngeal erythema.  Eyes:     General: Lids are normal.        Right eye: No discharge.        Left eye: No discharge.     Extraocular Movements: Extraocular movements intact.     Conjunctiva/sclera: Conjunctivae normal.     Pupils: Pupils are equal, round, and reactive to light.     Visual Fields: Right eye visual fields normal and left eye visual fields normal.  Neck:     Thyroid: No thyromegaly.     Vascular: No carotid bruit.     Trachea: Trachea normal.  Cardiovascular:     Rate and Rhythm: Normal rate and regular rhythm.     Heart sounds: Normal heart  sounds. No murmur heard.  No gallop.   Pulmonary:     Effort: Pulmonary effort is normal. No accessory muscle usage or respiratory distress.     Breath sounds: Normal breath sounds.  Chest:     Breasts:        Right: Normal.        Left: Normal.  Abdominal:     General: Bowel sounds are normal.     Palpations: Abdomen is soft. There is no hepatomegaly or splenomegaly.     Tenderness: There is no abdominal tenderness.  Musculoskeletal:        General: Normal range of motion.     Cervical back: Normal range of motion and neck supple.     Right lower leg: No edema.     Left lower leg: No edema.  Lymphadenopathy:     Head:     Right side of head: No submental, submandibular, tonsillar, preauricular or posterior auricular adenopathy.     Left side of head: No submental, submandibular, tonsillar, preauricular or posterior auricular adenopathy.     Cervical: No cervical adenopathy.     Upper Body:     Right upper body: No supraclavicular, axillary or pectoral adenopathy.     Left upper body: No supraclavicular, axillary or pectoral adenopathy.  Skin:    General: Skin is warm and dry.     Capillary Refill: Capillary refill takes less than 2 seconds.  Findings: No rash.  Neurological:     Mental Status: She is alert and oriented to person, place, and time.     Cranial Nerves: Cranial nerves are intact.     Gait: Gait is intact.     Deep Tendon Reflexes: Reflexes are normal and symmetric.     Reflex Scores:      Brachioradialis reflexes are 2+ on the right side and 2+ on the left side.      Patellar reflexes are 2+ on the right side and 2+ on the left side. Psychiatric:        Attention and Perception: Attention normal.        Mood and Affect: Mood normal.        Speech: Speech normal.        Behavior: Behavior normal. Behavior is cooperative.        Thought Content: Thought content normal.        Judgment: Judgment normal.     Results for orders placed or performed in visit  on 01/29/20  Basic Metabolic Panel (BMET)  Result Value Ref Range   Glucose 102 (H) 65 - 99 mg/dL   BUN 15 8 - 27 mg/dL   Creatinine, Ser 5.32 0.57 - 1.00 mg/dL   GFR calc non Af Amer 94 >59 mL/min/1.73   GFR calc Af Amer 109 >59 mL/min/1.73   BUN/Creatinine Ratio 24 12 - 28   Sodium 143 134 - 144 mmol/L   Potassium 4.2 3.5 - 5.2 mmol/L   Chloride 104 96 - 106 mmol/L   CO2 23 20 - 29 mmol/L   Calcium 10.0 8.7 - 10.3 mg/dL      Assessment & Plan:   Problem List Items Addressed This Visit      Cardiovascular and Mediastinum   Hypertension    Chronic, stable with BP at goal.  More consistently below goal at this time.  Will continue current medication regimen and adjust as needed.  Discussed with her that if lower readings noted at home, may consider discontinuation of Amlodipine.  CMP, TSH, and CBC today.  Continue to monitor BP at home regularly and focus on DASH diet.  Plan on follow-up in 6 months.      Relevant Orders   CBC with Differential/Platelet   Comprehensive metabolic panel   Lipid Panel w/o Chol/HDL Ratio   TSH     Musculoskeletal and Integument   Osteopenia    Ongoing.  Continue CA+ and Vit D daily.  Will plan on repeat DEXA in April 2022, along with her mammogram.      Relevant Orders   VITAMIN D 25 Hydroxy (Vit-D Deficiency, Fractures)   DG Bone Density     Other   Obesity    BMI 31.61 today.  Recommended eating smaller high protein, low fat meals more frequently and exercising 30 mins a day 5 times a week with a goal of 10-15lb weight loss in the next 3 months. Patient voiced their understanding and motivation to adhere to these recommendations.       Elevated LDL cholesterol level    No current medications, last LDL 114.  Recheck levels today.  Current ASCVD 7.6%.  Continue focus on diet and regular exercise.  Initiate statin as needed.       Other Visit Diagnoses    Routine general medical examination at a health care facility    -  Primary    Annual labs today to include CBC, CMP, TSH, lipid.  Relevant Orders   CBC with Differential/Platelet   Lipid Panel w/o Chol/HDL Ratio   TSH   Encounter for screening mammogram for malignant neoplasm of breast       Relevant Orders   MM DIGITAL SCREENING BILATERAL   Need for influenza vaccination       Relevant Orders   Flu Vaccine QUAD High Dose(Fluad) (Completed)       Follow up plan: Return in about 6 months (around 02/23/2021) for HTN.   LABORATORY TESTING:  - Pap smear: not applicable -- last pap 2018 was negative HPV and cytology  IMMUNIZATIONS:  -- declines Covid vaccine, discussed with her and she feels it has not had enough research - Tdap: Tetanus vaccination status reviewed: last tetanus booster within 10 years. - Influenza: Administered today - Pneumovax: Up to date - Prevnar: Not applicable - HPV: Not applicable - Zostavax vaccine: Up to date  SCREENING: -Mammogram: Up to date  - Colonoscopy: Up to date  - Bone Density: Up to date -- repeat due this year due to osteopenia -Hearing Test: Not applicable  -Spirometry: Not applicable   PATIENT COUNSELING:   Advised to take 1 mg of folate supplement per day if capable of pregnancy.   Sexuality: Discussed sexually transmitted diseases, partner selection, use of condoms, avoidance of unintended pregnancy  and contraceptive alternatives.   Advised to avoid cigarette smoking.  I discussed with the patient that most people either abstain from alcohol or drink within safe limits (<=14/week and <=4 drinks/occasion for males, <=7/weeks and <= 3 drinks/occasion for females) and that the risk for alcohol disorders and other health effects rises proportionally with the number of drinks per week and how often a drinker exceeds daily limits.  Discussed cessation/primary prevention of drug use and availability of treatment for abuse.   Diet: Encouraged to adjust caloric intake to maintain  or achieve ideal body weight, to  reduce intake of dietary saturated fat and total fat, to limit sodium intake by avoiding high sodium foods and not adding table salt, and to maintain adequate dietary potassium and calcium preferably from fresh fruits, vegetables, and low-fat dairy products.    stressed the importance of regular exercise  Injury prevention: Discussed safety belts, safety helmets, smoke detector, smoking near bedding or upholstery.   Dental health: Discussed importance of regular tooth brushing, flossing, and dental visits.    NEXT PREVENTATIVE PHYSICAL DUE IN 1 YEAR. Return in about 6 months (around 02/23/2021) for HTN.

## 2020-08-26 NOTE — Assessment & Plan Note (Addendum)
No current medications, last LDL 114.  Recheck levels today.  Current ASCVD 7.6%.  Continue focus on diet and regular exercise.  Initiate statin as needed.

## 2020-08-26 NOTE — Patient Instructions (Addendum)
Influenza (Flu) Vaccine (Inactivated or Recombinant): What You Need to Know 1. Why get vaccinated? Influenza vaccine can prevent influenza (flu). Flu is a contagious disease that spreads around the [place] every year, usually between October and May. Anyone can get the flu, but it is more dangerous for some people. Infants and young children, people 66 years of age and older, pregnant women, and people with certain health conditions or a weakened immune system are at greatest risk of flu complications. Pneumonia, bronchitis, sinus infections and ear infections are examples of flu-related complications. If you have a medical condition, such as heart disease, cancer or diabetes, flu can make it worse. Flu can cause fever and chills, sore throat, muscle aches, fatigue, cough, headache, and runny or stuffy nose. Some people may have vomiting and diarrhea, though this is more common in children than adults. Each year thousands of people in the [place] die from flu, and many more are hospitalized. Flu vaccine prevents millions of illnesses and flu-related visits to the doctor each year. 2. Influenza vaccine CDC recommends everyone 57 months of age and older get vaccinated every flu season. Children 6 months through 2 years of age may need 2 doses during a single flu season. Everyone else needs only 1 dose each flu season. It takes about 2 weeks for protection to develop after vaccination. There are many flu viruses, and they are always changing. Each year a new flu vaccine is made to protect against three or four viruses that are likely to cause disease in the upcoming flu season. Even when the vaccine doesn't exactly match these viruses, it may still provide some protection. Influenza vaccine does not cause flu. Influenza vaccine may be given at the same time as other vaccines. 3. Talk with your health care provider Tell your vaccine provider if the person getting the vaccine:  Has had an  allergic reaction after a previous dose of influenza vaccine, or has any severe, life-threatening allergies.  Has ever had Guillain-Barr Syndrome (also called GBS). In some cases, your health care provider may decide to postpone influenza vaccination to a future visit. People with minor illnesses, such as a cold, may be vaccinated. People who are moderately or severely ill should usually wait until they recover before getting influenza vaccine. Your health care provider can give you more information. 4. Risks of a vaccine reaction  Soreness, redness, and swelling where shot is given, fever, muscle aches, and headache can happen after influenza vaccine.  There may be a very small increased risk of Guillain-Barr Syndrome (GBS) after inactivated influenza vaccine (the flu shot). Young children who get the flu shot along with pneumococcal vaccine (PCV13), and/or DTaP vaccine at the same time might be slightly more likely to have a seizure caused by fever. Tell your health care provider if a child who is getting flu vaccine has ever had a seizure. People sometimes faint after medical procedures, including vaccination. Tell your provider if you feel dizzy or have vision changes or ringing in the ears. As with any medicine, there is a very remote chance of a vaccine causing a severe allergic reaction, other serious injury, or death. 5. What if there is a serious problem? An allergic reaction could occur after the vaccinated person leaves the clinic. If you see signs of a severe allergic reaction (hives, swelling of the face and throat, difficulty breathing, a fast heartbeat, dizziness, or weakness), call 9-1-1 and get the person to the nearest hospital. For other signs that  concern you, call your health care provider. Adverse reactions should be reported to the Vaccine Adverse Event Reporting System (VAERS). Your health care provider will usually file this report, or you can do it yourself. Visit the  VAERS website at www.vaers.LAgents.no or call 513-589-2878.VAERS is only for reporting reactions, and VAERS staff do not give medical advice. 6. The National Vaccine Injury Compensation Program The [employer] Vaccine Injury Compensation Program (VICP) is a federal program that was created to compensate people who may have been injured by certain vaccines. Visit the VICP website at SpiritualWord.at or call 267-732-4882 to learn about the program and about filing a claim. There is a time limit to file a claim for compensation. 7. How can I learn more?  Ask your healthcare provider.  Call your local or state health department.  Contact the Centers for Disease Control and Prevention (CDC): ? Call (470)169-7332 (1-800-CDC-INFO) or ? Visit CDC's BiotechRoom.com.cy Vaccine Information Statement (Interim) Inactivated Influenza Vaccine (07/28/2018) This information is not intended to replace advice given to you by your health care provider. Make sure you discuss any questions you have with your health care provider. Document Revised: 03/21/2019 Document Reviewed: 08/01/2018 Elsevier Patient Education  2020 Elsevier Inc.  DASH Eating Plan DASH stands for "Dietary Approaches to Stop Hypertension." The DASH eating plan is a healthy eating plan that has been shown to reduce high blood pressure (hypertension). It may also reduce your risk for type 2 diabetes, heart disease, and stroke. The DASH eating plan may also help with weight loss. What are tips for following this plan?  General guidelines  Avoid eating more than 2,300 mg (milligrams) of salt (sodium) a day. If you have hypertension, you may need to reduce your sodium intake to 1,500 mg a day.  Limit alcohol intake to no more than 1 drink a day for nonpregnant women and 2 drinks a day for men. One drink equals 12 oz of beer, 5 oz of wine, or 1 oz of hard liquor.  Work with your health care provider to maintain a healthy body weight  or to lose weight. Ask what an ideal weight is for you.  Get at least 30 minutes of exercise that causes your heart to beat faster (aerobic exercise) most days of the week. Activities may include walking, swimming, or biking.  Work with your health care provider or diet and nutrition specialist (dietitian) to adjust your eating plan to your individual calorie needs. Reading food labels   Check food labels for the amount of sodium per serving. Choose foods with less than 5 percent of the Daily Value of sodium. Generally, foods with less than 300 mg of sodium per serving fit into this eating plan.  To find whole grains, look for the word "whole" as the first word in the ingredient list. Shopping  Buy products labeled as "low-sodium" or "no salt added."  Buy fresh foods. Avoid canned foods and premade or frozen meals. Cooking  Avoid adding salt when cooking. Use salt-free seasonings or herbs instead of table salt or sea salt. Check with your health care provider or pharmacist before using salt substitutes.  Do not fry foods. Cook foods using healthy methods such as baking, boiling, grilling, and broiling instead.  Cook with heart-healthy oils, such as olive, canola, soybean, or sunflower oil. Meal planning  Eat a balanced diet that includes: ? 5 or more servings of fruits and vegetables each day. At each meal, try to fill half of your plate with fruits and  vegetables. ? Up to 6-8 servings of whole grains each day. ? Less than 6 oz of lean meat, poultry, or fish each day. A 3-oz serving of meat is about the same size as a deck of cards. One egg equals 1 oz. ? 2 servings of low-fat dairy each day. ? A serving of nuts, seeds, or beans 5 times each week. ? Heart-healthy fats. Healthy fats called Omega-3 fatty acids are found in foods such as flaxseeds and coldwater fish, like sardines, salmon, and mackerel.  Limit how much you eat of the following: ? Canned or prepackaged foods. ? Food  that is high in trans fat, such as fried foods. ? Food that is high in saturated fat, such as fatty meat. ? Sweets, desserts, sugary drinks, and other foods with added sugar. ? Full-fat dairy products.  Do not salt foods before eating.  Try to eat at least 2 vegetarian meals each week.  Eat more home-cooked food and less restaurant, buffet, and fast food.  When eating at a restaurant, ask that your food be prepared with less salt or no salt, if possible. What foods are recommended? The items listed may not be a complete list. Talk with your dietitian about what dietary choices are best for you. Grains Whole-grain or whole-wheat bread. Whole-grain or whole-wheat pasta. Brown rice. Orpah Cobb. Bulgur. Whole-grain and low-sodium cereals. Pita bread. Low-fat, low-sodium crackers. Whole-wheat flour tortillas. Vegetables Fresh or frozen vegetables (raw, steamed, roasted, or grilled). Low-sodium or reduced-sodium tomato and vegetable juice. Low-sodium or reduced-sodium tomato sauce and tomato paste. Low-sodium or reduced-sodium canned vegetables. Fruits All fresh, dried, or frozen fruit. Canned fruit in natural juice (without added sugar). Meat and other protein foods Skinless chicken or [place]. Ground chicken or [place]. Pork with fat trimmed off. Fish and seafood. Egg whites. Dried beans, peas, or lentils. Unsalted nuts, nut butters, and seeds. Unsalted canned beans. Lean cuts of beef with fat trimmed off. Low-sodium, lean deli meat. Dairy Low-fat (1%) or fat-free (skim) milk. Fat-free, low-fat, or reduced-fat cheeses. Nonfat, low-sodium ricotta or cottage cheese. Low-fat or nonfat yogurt. Low-fat, low-sodium cheese. Fats and oils Soft margarine without trans fats. Vegetable oil. Low-fat, reduced-fat, or light mayonnaise and salad dressings (reduced-sodium). Canola, safflower, olive, soybean, and sunflower oils. Avocado. Seasoning and other foods Herbs. Spices. Seasoning mixes without salt.  Unsalted popcorn and pretzels. Fat-free sweets. What foods are not recommended? The items listed may not be a complete list. Talk with your dietitian about what dietary choices are best for you. Grains Baked goods made with fat, such as croissants, muffins, or some breads. Dry pasta or rice meal packs. Vegetables Creamed or fried vegetables. Vegetables in a cheese sauce. Regular canned vegetables (not low-sodium or reduced-sodium). Regular canned tomato sauce and paste (not low-sodium or reduced-sodium). Regular tomato and vegetable juice (not low-sodium or reduced-sodium). Rosita Fire. Olives. Fruits Canned fruit in a light or heavy syrup. Fried fruit. Fruit in cream or butter sauce. Meat and other protein foods Fatty cuts of meat. Ribs. Fried meat. Tomasa Blase. Sausage. Bologna and other processed lunch meats. Salami. Fatback. Hotdogs. Bratwurst. Salted nuts and seeds. Canned beans with added salt. Canned or smoked fish. Whole eggs or egg yolks. Chicken or [place] with skin. Dairy Whole or 2% milk, cream, and half-and-half. Whole or full-fat cream cheese. Whole-fat or sweetened yogurt. Full-fat cheese. Nondairy creamers. Whipped toppings. Processed cheese and cheese spreads. Fats and oils Butter. Stick margarine. Lard. Shortening. Ghee. Bacon fat. Tropical oils, such as coconut, palm kernel, or  palm oil. Seasoning and other foods Salted popcorn and pretzels. Onion salt, garlic salt, seasoned salt, table salt, and sea salt. Worcestershire sauce. Tartar sauce. Barbecue sauce. Teriyaki sauce. Soy sauce, including reduced-sodium. Steak sauce. Canned and packaged gravies. Fish sauce. Oyster sauce. Cocktail sauce. Horseradish that you find on the shelf. Ketchup. Mustard. Meat flavorings and tenderizers. Bouillon cubes. Hot sauce and Tabasco sauce. Premade or packaged marinades. Premade or packaged taco seasonings. Relishes. Regular salad dressings. Where to find more information:  National Heart, Lung, and Blood  Institute: PopSteam.is  American Heart Association: www.heart.org Summary  The DASH eating plan is a healthy eating plan that has been shown to reduce high blood pressure (hypertension). It may also reduce your risk for type 2 diabetes, heart disease, and stroke.  With the DASH eating plan, you should limit salt (sodium) intake to 2,300 mg a day. If you have hypertension, you may need to reduce your sodium intake to 1,500 mg a day.  When on the DASH eating plan, aim to eat more fresh fruits and vegetables, whole grains, lean proteins, low-fat dairy, and heart-healthy fats.  Work with your health care provider or diet and nutrition specialist (dietitian) to adjust your eating plan to your individual calorie needs. This information is not intended to replace advice given to you by your health care provider. Make sure you discuss any questions you have with your health care provider. Document Revised: 11/12/2017 Document Reviewed: 11/23/2016 Elsevier Patient Education  2020 [employer].

## 2020-08-27 LAB — CBC WITH DIFFERENTIAL/PLATELET
Basophils Absolute: 0 10*3/uL (ref 0.0–0.2)
Basos: 0 %
EOS (ABSOLUTE): 0.1 10*3/uL (ref 0.0–0.4)
Eos: 2 %
Hematocrit: 44 % (ref 34.0–46.6)
Hemoglobin: 14.3 g/dL (ref 11.1–15.9)
Immature Grans (Abs): 0 10*3/uL (ref 0.0–0.1)
Immature Granulocytes: 0 %
Lymphocytes Absolute: 2.2 10*3/uL (ref 0.7–3.1)
Lymphs: 37 %
MCH: 30.8 pg (ref 26.6–33.0)
MCHC: 32.5 g/dL (ref 31.5–35.7)
MCV: 95 fL (ref 79–97)
Monocytes Absolute: 0.4 10*3/uL (ref 0.1–0.9)
Monocytes: 7 %
Neutrophils Absolute: 3.3 10*3/uL (ref 1.4–7.0)
Neutrophils: 54 %
Platelets: 314 10*3/uL (ref 150–450)
RBC: 4.65 x10E6/uL (ref 3.77–5.28)
RDW: 12.5 % (ref 11.7–15.4)
WBC: 6 10*3/uL (ref 3.4–10.8)

## 2020-08-27 LAB — LIPID PANEL W/O CHOL/HDL RATIO
Cholesterol, Total: 171 mg/dL (ref 100–199)
HDL: 55 mg/dL (ref >39)
LDL Chol Calc (NIH): 97 mg/dL (ref 0–99)
Triglycerides: 107 mg/dL (ref 0–149)
VLDL Cholesterol Cal: 19 mg/dL (ref 5–40)

## 2020-08-27 LAB — COMPREHENSIVE METABOLIC PANEL
ALT: 21 IU/L (ref 0–32)
AST: 18 IU/L (ref 0–40)
Albumin/Globulin Ratio: 1.4 (ref 1.2–2.2)
Albumin: 4.4 g/dL (ref 3.8–4.8)
Alkaline Phosphatase: 77 IU/L (ref 44–121)
BUN/Creatinine Ratio: 18 (ref 12–28)
BUN: 14 mg/dL (ref 8–27)
Bilirubin Total: 0.6 mg/dL (ref 0.0–1.2)
CO2: 22 mmol/L (ref 20–29)
Calcium: 9.6 mg/dL (ref 8.7–10.3)
Chloride: 100 mmol/L (ref 96–106)
Creatinine, Ser: 0.78 mg/dL (ref 0.57–1.00)
GFR calc Af Amer: 92 mL/min/{1.73_m2} (ref >59)
GFR calc non Af Amer: 79 mL/min/{1.73_m2} (ref >59)
Globulin, Total: 3.1 g/dL (ref 1.5–4.5)
Glucose: 97 mg/dL (ref 65–99)
Potassium: 4.9 mmol/L (ref 3.5–5.2)
Sodium: 138 mmol/L (ref 134–144)
Total Protein: 7.5 g/dL (ref 6.0–8.5)

## 2020-08-27 LAB — TSH: TSH: 0.873 u[IU]/mL (ref 0.450–4.500)

## 2020-08-27 LAB — VITAMIN D 25 HYDROXY (VIT D DEFICIENCY, FRACTURES): Vit D, 25-Hydroxy: 41.6 ng/mL (ref 30.0–100.0)

## 2020-08-27 NOTE — Progress Notes (Signed)
Contacted via MyChart  Good evening Mary Wood.  Overall labs look great, continue current medications:) Keep being awesome!!  Thank you for allowing me to participate in your care. Kindest regards, Dearis Danis

## 2020-11-19 ENCOUNTER — Encounter: Payer: Self-pay | Admitting: Nurse Practitioner

## 2020-11-19 ENCOUNTER — Ambulatory Visit (INDEPENDENT_AMBULATORY_CARE_PROVIDER_SITE_OTHER): Payer: Medicare PPO | Admitting: Nurse Practitioner

## 2020-11-19 ENCOUNTER — Other Ambulatory Visit: Payer: Self-pay

## 2020-11-19 VITALS — BP 132/80 | HR 85 | Temp 98.0°F | Ht 66.77 in | Wt 205.8 lb

## 2020-11-19 DIAGNOSIS — M25561 Pain in right knee: Secondary | ICD-10-CM | POA: Insufficient documentation

## 2020-11-19 NOTE — Assessment & Plan Note (Signed)
Acute x one month.  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  Recommend continue daily Tylenol as needed, may take Ibuprofen but minimally due to HTN.  Recommend rest knee and alternate heat and ice.  Will place referral to ortho for further assessment and recommendations, wishes to defer imaging until ortho visit.  May wear knee compression support as needed.  Return in March for chronic disease visit, sooner if worsening pain.

## 2020-11-19 NOTE — Patient Instructions (Signed)
Baker Cyst ° °A Baker cyst, also called a popliteal cyst, is a growth that forms at the back of the knee. The cyst forms when the fluid-filled sac (bursa) that cushions the knee joint becomes enlarged. °What are the causes? °In most cases, a Baker cyst results from another knee problem that causes swelling inside the knee. This makes the fluid inside the knee joint (synovial fluid) flow into the bursa behind the knee, causing the bursa to enlarge. °What increases the risk? °You may be more likely to develop a Baker cyst if you already have a knee problem, such as: °· A tear in cartilage that cushions the knee joint (meniscal tear). °· A tear in the tissues that connect the bones of the knee joint (ligament tear). °· Knee swelling from osteoarthritis, rheumatoid arthritis, or gout. °What are the signs or symptoms? °The main symptom of this condition is a lump behind the knee. This may be the only symptom of the condition. The lump may be painful, especially when the knee is straightened. If the lump is painful, the pain may come and go. The knee may also be stiff. °Symptoms may quickly get more severe if the cyst breaks open (ruptures). If the cyst ruptures, you may feel the following in your knee and calf: °· Sudden or worsening pain. °· Swelling. °· Bruising. °· Redness in the calf. °A Baker cyst does not always cause symptoms. °How is this diagnosed? °This condition may be diagnosed based on your symptoms and medical history. Your health care provider will also do a physical exam. This may include: °· Feeling the cyst to check whether it is tender. °· Checking your knee for signs of another knee condition that causes swelling. °You may have imaging tests, such as: °· X-rays. °· MRI. °· Ultrasound. °How is this treated? °A Baker cyst that is not painful may go away without treatment. If the cyst gets large or painful, it will likely get better if the underlying knee problem is treated. °If needed, treatment for a  Baker cyst may include: °· Resting. °· Keeping weight off of the knee. This means not leaning on the knee to support your body weight. °· Taking NSAIDs, such as ibuprofen, to reduce pain and swelling. °· Having a procedure to drain the fluid from the cyst with a needle (aspiration). You may also get an injection of a medicine that reduces swelling (steroid). °· Having surgery. This may be needed if other treatments do not work. This usually involves correcting knee damage and removing the cyst. °Follow these instructions at home: ° °Activity °· Rest as told by your health care provider. °· Avoid activities that make pain or swelling worse. °· Return to your normal activities as told by your health care provider. Ask your health care provider what activities are safe for you. °· Do not use the injured limb to support your body weight until your health care provider says that you can. Use crutches as told by your health care provider. °General instructions °· Take over-the-counter and prescription medicines only as told by your health care provider. °· Keep all follow-up visits as told by your health care provider. This is important. °Contact a health care provider if: °· You have knee pain, stiffness, or swelling that does not get better. °Get help right away if: °· You have sudden or worsening pain and swelling in your calf area. °Summary °· A Baker cyst, also called a popliteal cyst, is a growth that forms at the   back of the knee. °· In most cases, a Baker cyst results from another knee problem that causes swelling inside the knee. °· A Baker cyst that is not painful may go away without treatment. °· If needed, treatment for a Baker cyst may include resting, keeping weight off of the knee, medicines, or draining fluid from the cyst. °· Surgery may be needed if other treatments are not effective. °This information is not intended to replace advice given to you by your health care provider. Make sure you discuss any  questions you have with your health care provider. °Document Revised: 04/14/2019 Document Reviewed: 04/14/2019 °Elsevier Patient Education © 2020 Elsevier Inc. ° ° ° °

## 2020-11-19 NOTE — Progress Notes (Signed)
BP 132/80   Pulse 85   Temp 98 F (36.7 C) (Oral)   Ht 5' 6.77" (1.696 m)   Wt 205 lb 12.8 oz (93.4 kg)   LMP  (LMP Unknown)   SpO2 98%   BMI 32.45 kg/m    Subjective:    Patient ID: Mary Wood, female    DOB: [birth year], 64 y.o.   MRN: [id number]  HPI: Mary Wood is a 64 y.o. female  Chief Complaint  Patient presents with  . Knee Pain    Pain in back of knee for about 1 month.    KNEE PAIN To back of right knee for about one month.  Has been doing a lot of house work, with painting and remodeling.  No recent falls.  Was going up and down the ladder frequently.   Duration: weeks Involved knee: right Mechanism of injury: unknown Location:posterior Onset: sudden Severity: 10/10 at worst and currently 3/10 Quality:  Throbbing and aching Frequency: constant -- goes away if in recliner Radiation: no Aggravating factors: weight bearing, walking, stairs, bending and movement  Alleviating factors: APAP and NSAIDs  Status: fluctuating Treatments attempted: APAP and ibuprofen  Relief with NSAIDs?:  moderate Weakness with weight bearing or walking: occasional with walking after she stands up Sensation of giving way: no Locking: no Popping: no Bruising: no Swelling: no Redness: no Paresthesias/decreased sensation: no Fevers: no  Relevant past medical, surgical, family and social history reviewed and updated as indicated. Interim medical history since our last visit reviewed. Allergies and medications reviewed and updated.  Review of Systems  Constitutional: Negative for activity change, appetite change, diaphoresis, fatigue and fever.  Respiratory: Negative for cough, chest tightness and shortness of breath.   Cardiovascular: Negative for chest pain, palpitations and leg swelling.  Gastrointestinal: Negative.   Musculoskeletal: Positive for arthralgias.  Neurological: Negative.   Psychiatric/Behavioral: Negative.     Per HPI unless specifically indicated  above     Objective:    BP 132/80   Pulse 85   Temp 98 F (36.7 C) (Oral)   Ht 5' 6.77" (1.696 m)   Wt 205 lb 12.8 oz (93.4 kg)   LMP  (LMP Unknown)   SpO2 98%   BMI 32.45 kg/m   Wt Readings from Last 3 Encounters:  11/19/20 205 lb 12.8 oz (93.4 kg)  08/26/20 203 lb (92.1 kg)  10/04/19 200 lb (90.7 kg)    Physical Exam Vitals and nursing note reviewed.  Constitutional:      General: She is awake. She is not in acute distress.    Appearance: She is well-developed and overweight. She is not ill-appearing.  HENT:     Head: Normocephalic.     Right Ear: Hearing normal.     Left Ear: Hearing normal.  Eyes:     General: Lids are normal.        Right eye: No discharge.        Left eye: No discharge.     Conjunctiva/sclera: Conjunctivae normal.     Pupils: Pupils are equal, round, and reactive to light.  Neck:     Vascular: No carotid bruit.  Cardiovascular:     Rate and Rhythm: Normal rate and regular rhythm.     Heart sounds: Normal heart sounds. No murmur heard.  No gallop.   Pulmonary:     Effort: Pulmonary effort is normal. No accessory muscle usage or respiratory distress.     Breath sounds: Normal breath sounds.  Abdominal:  General: Bowel sounds are normal.     Palpations: Abdomen is soft.  Musculoskeletal:     Cervical back: Normal range of motion and neck supple.     Right knee: Swelling (mild posterior knee) and crepitus present. No erythema, ecchymosis, lacerations or bony tenderness. Normal range of motion. Tenderness (mild posterior knee) present. Normal alignment and normal meniscus. Normal pulse.     Left knee: Normal.     Right lower leg: No edema.     Left lower leg: No edema.       Legs:  Skin:    General: Skin is warm and dry.  Neurological:     Mental Status: She is alert and oriented to person, place, and time.  Psychiatric:        Attention and Perception: Attention normal.        Mood and Affect: Mood normal.        Speech: Speech  normal.        Behavior: Behavior normal. Behavior is cooperative.     Results for orders placed or performed in visit on 08/26/20  CBC with Differential/Platelet  Result Value Ref Range   WBC 6.0 3.4 - 10.8 x10E3/uL   RBC 4.65 3.77 - 5.28 x10E6/uL   Hemoglobin 14.3 11.1 - 15.9 g/dL   Hematocrit 95.0 93.2 - 46.6 %   MCV 95 79 - 97 fL   MCH 30.8 26.6 - 33.0 pg   MCHC 32.5 31 - 35 g/dL   RDW 67.1 24.5 - 80.9 %   Platelets 314 150 - 450 x10E3/uL   Neutrophils 54 Not Estab. %   Lymphs 37 Not Estab. %   Monocytes 7 Not Estab. %   Eos 2 Not Estab. %   Basos 0 Not Estab. %   Neutrophils Absolute 3.3 1.40 - 7.00 x10E3/uL   Lymphocytes Absolute 2.2 0 - 3 x10E3/uL   Monocytes Absolute 0.4 0 - 0 x10E3/uL   EOS (ABSOLUTE) 0.1 0.0 - 0.4 x10E3/uL   Basophils Absolute 0.0 0 - 0 x10E3/uL   Immature Granulocytes 0 Not Estab. %   Immature Grans (Abs) 0.0 0.0 - 0.1 x10E3/uL  Comprehensive metabolic panel  Result Value Ref Range   Glucose 97 65 - 99 mg/dL   BUN 14 8 - 27 mg/dL   Creatinine, Ser 9.83 0.57 - 1.00 mg/dL   GFR calc non Af Amer 79 >59 mL/min/1.73   GFR calc Af Amer 92 >59 mL/min/1.73   BUN/Creatinine Ratio 18 12 - 28   Sodium 138 134 - 144 mmol/L   Potassium 4.9 3.5 - 5.2 mmol/L   Chloride 100 96 - 106 mmol/L   CO2 22 20 - 29 mmol/L   Calcium 9.6 8.7 - 10.3 mg/dL   Total Protein 7.5 6.0 - 8.5 g/dL   Albumin 4.4 3.8 - 4.8 g/dL   Globulin, Total 3.1 1.5 - 4.5 g/dL   Albumin/Globulin Ratio 1.4 1.2 - 2.2   Bilirubin Total 0.6 0.0 - 1.2 mg/dL   Alkaline Phosphatase 77 44 - 121 IU/L   AST 18 0 - 40 IU/L   ALT 21 0 - 32 IU/L  Lipid Panel w/o Chol/HDL Ratio  Result Value Ref Range   Cholesterol, Total 171 100 - 199 mg/dL   Triglycerides 382 0 - 149 mg/dL   HDL 55 >50 mg/dL   VLDL Cholesterol Cal 19 5 - 40 mg/dL   LDL Chol Calc (NIH) 97 0 - 99 mg/dL  TSH  Result Value Ref Range  TSH 0.873 0.450 - 4.500 uIU/mL  VITAMIN D 25 Hydroxy (Vit-D Deficiency, Fractures)  Result  Value Ref Range   Vit D, 25-Hydroxy 41.6 30.0 - 100.0 ng/mL      Assessment & Plan:   Problem List Items Addressed This Visit      Other   Posterior right knee pain - Primary    Acute x one month.  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  Recommend continue daily Tylenol as needed, may take Ibuprofen but minimally due to HTN.  Recommend rest knee and alternate heat and ice.  Will place referral to ortho for further assessment and recommendations, wishes to defer imaging until ortho visit.  May wear knee compression support as needed.  Return in March for chronic disease visit, sooner if worsening pain.      Relevant Orders   Ambulatory referral to Orthopedics       Follow up plan: Return in 3 months (on 03/05/2021) for HTN/HLD.

## 2021-02-13 ENCOUNTER — Other Ambulatory Visit (HOSPITAL_COMMUNITY): Payer: Self-pay | Admitting: Orthopedic Surgery

## 2021-02-13 DIAGNOSIS — M2391 Unspecified internal derangement of right knee: Secondary | ICD-10-CM

## 2021-02-28 ENCOUNTER — Other Ambulatory Visit: Payer: Self-pay

## 2021-02-28 ENCOUNTER — Ambulatory Visit
Admission: RE | Admit: 2021-02-28 | Discharge: 2021-02-28 | Disposition: A | Discharge status: Home / Self Care | Payer: Medicare PPO | Source: Ambulatory Visit | Attending: Orthopedic Surgery | Admitting: Orthopedic Surgery

## 2021-02-28 DIAGNOSIS — M2391 Unspecified internal derangement of right knee: Secondary | ICD-10-CM | POA: Insufficient documentation

## 2021-03-01 ENCOUNTER — Encounter: Payer: Self-pay | Admitting: Nurse Practitioner

## 2021-03-05 ENCOUNTER — Encounter: Payer: Self-pay | Admitting: Nurse Practitioner

## 2021-03-05 ENCOUNTER — Ambulatory Visit: Payer: Medicare PPO | Admitting: Nurse Practitioner

## 2021-03-05 ENCOUNTER — Other Ambulatory Visit: Payer: Self-pay

## 2021-03-05 VITALS — BP 125/77 | HR 74 | Temp 97.9°F | Wt 207.0 lb

## 2021-03-05 DIAGNOSIS — Z6832 Body mass index (BMI) 32.0-32.9, adult: Secondary | ICD-10-CM

## 2021-03-05 DIAGNOSIS — E66811 Obesity, class 1: Secondary | ICD-10-CM

## 2021-03-05 DIAGNOSIS — I10 Essential (primary) hypertension: Secondary | ICD-10-CM

## 2021-03-05 DIAGNOSIS — M25561 Pain in right knee: Secondary | ICD-10-CM | POA: Diagnosis not present

## 2021-03-05 DIAGNOSIS — M8588 Other specified disorders of bone density and structure, other site: Secondary | ICD-10-CM

## 2021-03-05 DIAGNOSIS — E7800 Pure hypercholesterolemia, unspecified: Secondary | ICD-10-CM

## 2021-03-05 DIAGNOSIS — Z1231 Encounter for screening mammogram for malignant neoplasm of breast: Secondary | ICD-10-CM

## 2021-03-05 DIAGNOSIS — E6609 Other obesity due to excess calories: Secondary | ICD-10-CM

## 2021-03-05 NOTE — Patient Instructions (Signed)
[facility]  Address: [address], [place], [place] [address]  Phone: [phone]   Mammogram A mammogram is an X-ray of the breasts. This is done to check for changes that are not normal. This test can look for changes that may be caused by breast cancer or other problems. Mammograms are regularly done on women beginning at age 67. A man may have a mammogram if he has a lump or swelling in his breast. Tell a doctor:  About any allergies you have.  If you have breast implants.  If you have had breast disease, biopsy, or surgery.  If you have a family history of breast cancer.  If you are breastfeeding.  Whether you are pregnant or may be pregnant. What are the risks? Generally, this is a safe procedure. But problems may occur, including:  Being exposed to radiation. Radiation levels are very low with this test.  The need for more tests.  The results were not read properly.  Trouble finding breast cancer in women with dense breasts. What happens before the test?  Have this test done about 1-2 weeks after your menstrual period. This is often when your breasts are the least tender.  If you are visiting a new doctor or clinic, have any past mammogram images sent to your new doctor's office.  Wash your breasts and under your arms on the day of the test.  Do not use deodorants, perfumes, lotions, or powders on the day of the test.  Take off any jewelry from your neck.  Wear clothes that you can change into and out of easily. What happens during the test?  You will take off your clothes from the waist up. You will put on a gown.  You will stand in front of the X-ray machine.  Each breast will be placed between two plastic or glass plates. The plates will press down on your breast for a few seconds. Try to relax. This does not cause any harm to your breasts. It may not feel comfortable, but it will be very brief.  X-rays will be  taken from different angles of each breast. The procedure may vary among doctors and hospitals.   What can I expect after the test?  The mammogram will be read by a specialist (radiologist).  You may need to do parts of the test again. This depends on the quality of the images.  You may go back to your normal activities.  It is up to you to get the results of your test. Ask how to get your results when they are ready. Summary  A mammogram is an X-ray of the breasts. It looks for changes that may be caused by breast cancer or other problems.  A man may have this test if he has a lump or swelling in his breast.  Before the test, tell your doctor about any breast problems that you have had in the past.  Have this test done about 1-2 weeks after your menstrual period.  Ask when your test results will be ready. Make sure you get your test results. This information is not intended to replace advice given to you by your health care provider. Make sure you discuss any questions you have with your health care provider. Document Revised: 09/30/2020 Document Reviewed: 09/30/2020 Elsevier Patient Education  2021 Elsevier Inc.  

## 2021-03-05 NOTE — Assessment & Plan Note (Signed)
No current medications, last LDL 97.  Recheck levels today.  Current ASCVD 7.3%.  Continue focus on diet and regular exercise.  Initiate statin as needed.

## 2021-03-05 NOTE — Assessment & Plan Note (Signed)
Order for DEXA, will obtain with her mammogram in April.  Return in 6 months for physical and Vitamin D check.

## 2021-03-05 NOTE — Progress Notes (Signed)
BP 125/77   Pulse 74   Temp 97.9 F (36.6 C) (Oral)   Wt 207 lb (93.9 kg)   LMP  (LMP Unknown)   SpO2 96%   BMI 32.64 kg/m    Subjective:    Patient ID: Mary Wood, female    DOB: 1956-06-14, 65 y.o.   MRN: [id number]  HPI: Mary Wood is a 65 y.o. female  Chief Complaint  Patient presents with  . Hypertension  . Hyperlipidemia  . Follow-up    Patient denies having any questions or concerns at today's visit. Patient would like to discuss recent MRI results.    HYPERTENSION/HLD Continues on Losartan 100 MG and Amlodipine 5 MG daily.  Is seeing ortho for right knee pain and had recent imaging on 02/28/21 noting complex tear of medial meniscus posterior horn -- talking to ortho about surgery. Hypertension status: controlled  Satisfied with current treatment? yes Duration of hypertension: chronic BP monitoring frequency:  rarely BP range: 120-130/70-84 range BP medication side effects:  no Medication compliance: good compliance Aspirin: no Recurrent headaches: no Visual changes: no Palpitations: no Dyspnea: no Chest pain: no Lower extremity edema: no Dizzy/lightheaded: no  The 10-year ASCVD risk score Denman George DC Jr., et al., 2013) is: 7.3%   Values used to calculate the score:     Age: 46 years     Sex: Female     Is Non-Hispanic African American: No     Diabetic: No     Tobacco smoker: No     Systolic Blood Pressure: 125 mmHg     Is BP treated: Yes     HDL Cholesterol: 55 mg/dL     Total Cholesterol: 171 mg/dL  Relevant past medical, surgical, family and social history reviewed and updated as indicated. Interim medical history since our last visit reviewed. Allergies and medications reviewed and updated.  Review of Systems  Constitutional: Negative for activity change, appetite change, diaphoresis, fatigue and fever.  Respiratory: Negative for cough, chest tightness and shortness of breath.   Cardiovascular: Negative for chest pain, palpitations and leg  swelling.  Gastrointestinal: Negative.   Neurological: Negative.   Psychiatric/Behavioral: Negative.     Per HPI unless specifically indicated above     Objective:    BP 125/77   Pulse 74   Temp 97.9 F (36.6 C) (Oral)   Wt 207 lb (93.9 kg)   LMP  (LMP Unknown)   SpO2 96%   BMI 32.64 kg/m   Wt Readings from Last 3 Encounters:  03/05/21 207 lb (93.9 kg)  11/19/20 205 lb 12.8 oz (93.4 kg)  08/26/20 203 lb (92.1 kg)    Physical Exam Vitals and nursing note reviewed.  Constitutional:      General: She is awake. She is not in acute distress.    Appearance: She is well-developed. She is not ill-appearing.  HENT:     Head: Normocephalic.     Right Ear: Hearing normal.     Left Ear: Hearing normal.  Eyes:     General: Lids are normal.        Right eye: No discharge.        Left eye: No discharge.     Conjunctiva/sclera: Conjunctivae normal.  Pulmonary:     Effort: Pulmonary effort is normal. No accessory muscle usage or respiratory distress.  Musculoskeletal:     Cervical back: Normal range of motion.  Neurological:     Mental Status: She is alert and oriented to person, place, and time.  Psychiatric:        Attention and Perception: Attention normal.        Mood and Affect: Mood normal.        Behavior: Behavior normal. Behavior is cooperative.        Thought Content: Thought content normal.        Judgment: Judgment normal.    Results for orders placed or performed in visit on 08/26/20  CBC with Differential/Platelet  Result Value Ref Range   WBC 6.0 3.4 - 10.8 x10E3/uL   RBC 4.65 3.77 - 5.28 x10E6/uL   Hemoglobin 14.3 11.1 - 15.9 g/dL   Hematocrit 32.4 40.1 - 46.6 %   MCV 95 79 - 97 fL   MCH 30.8 26.6 - 33.0 pg   MCHC 32.5 31.5 - 35.7 g/dL   RDW 02.7 25.3 - 66.4 %   Platelets 314 150 - 450 x10E3/uL   Neutrophils 54 Not Estab. %   Lymphs 37 Not Estab. %   Monocytes 7 Not Estab. %   Eos 2 Not Estab. %   Basos 0 Not Estab. %   Neutrophils Absolute 3.3 1.4  - 7.0 x10E3/uL   Lymphocytes Absolute 2.2 0.7 - 3.1 x10E3/uL   Monocytes Absolute 0.4 0.1 - 0.9 x10E3/uL   EOS (ABSOLUTE) 0.1 0.0 - 0.4 x10E3/uL   Basophils Absolute 0.0 0.0 - 0.2 x10E3/uL   Immature Granulocytes 0 Not Estab. %   Immature Grans (Abs) 0.0 0.0 - 0.1 x10E3/uL  Comprehensive metabolic panel  Result Value Ref Range   Glucose 97 65 - 99 mg/dL   BUN 14 8 - 27 mg/dL   Creatinine, Ser 4.03 0.57 - 1.00 mg/dL   GFR calc non Af Amer 79 >59 mL/min/1.73   GFR calc Af Amer 92 >59 mL/min/1.73   BUN/Creatinine Ratio 18 12 - 28   Sodium 138 134 - 144 mmol/L   Potassium 4.9 3.5 - 5.2 mmol/L   Chloride 100 96 - 106 mmol/L   CO2 22 20 - 29 mmol/L   Calcium 9.6 8.7 - 10.3 mg/dL   Total Protein 7.5 6.0 - 8.5 g/dL   Albumin 4.4 3.8 - 4.8 g/dL   Globulin, Total 3.1 1.5 - 4.5 g/dL   Albumin/Globulin Ratio 1.4 1.2 - 2.2   Bilirubin Total 0.6 0.0 - 1.2 mg/dL   Alkaline Phosphatase 77 44 - 121 IU/L   AST 18 0 - 40 IU/L   ALT 21 0 - 32 IU/L  Lipid Panel w/o Chol/HDL Ratio  Result Value Ref Range   Cholesterol, Total 171 100 - 199 mg/dL   Triglycerides 474 0 - 149 mg/dL   HDL 55 >25 mg/dL   VLDL Cholesterol Cal 19 5 - 40 mg/dL   LDL Chol Calc (NIH) 97 0 - 99 mg/dL  TSH  Result Value Ref Range   TSH 0.873 0.450 - 4.500 uIU/mL  VITAMIN D 25 Hydroxy (Vit-D Deficiency, Fractures)  Result Value Ref Range   Vit D, 25-Hydroxy 41.6 30.0 - 100.0 ng/mL      Assessment & Plan:   Problem List Items Addressed This Visit      Cardiovascular and Mediastinum   Hypertension - Primary    Chronic, stable with BP at goal.  More consistently below goal at this time.  Will continue current medication regimen and adjust as needed.  Discussed with her that if lower readings noted at home, may consider discontinuation of Amlodipine.  CMP and lipid today.  Continue to monitor BP at home regularly  and focus on DASH diet.  Plan on follow-up in 6 months.      Relevant Orders   Comprehensive metabolic panel      Musculoskeletal and Integument   Osteopenia of lumbar spine    Order for DEXA, will obtain with her mammogram in April.  Return in 6 months for physical and Vitamin D check.      Relevant Orders   DG Bone Density     Other   Obesity    BMI 32.64.  Recommended eating smaller high protein, low fat meals more frequently and exercising 30 mins a day 5 times a week with a goal of 10-15lb weight loss in the next 3 months. Patient voiced their understanding and motivation to adhere to these recommendations.       Elevated LDL cholesterol level    No current medications, last LDL 97.  Recheck levels today.  Current ASCVD 7.3%.  Continue focus on diet and regular exercise.  Initiate statin as needed.      Relevant Orders   Lipid Panel w/o Chol/HDL Ratio   Posterior right knee pain    With meniscus tear.  Continue collaboration, will be having upcoming surgery.       Other Visit Diagnoses    Encounter for screening mammogram for malignant neoplasm of breast       Mammogram ordered.   Relevant Orders   MM 3D SCREEN BREAST BILATERAL       Follow up plan: Return in about 6 months (around 09/05/2021) for Annual physical.

## 2021-03-05 NOTE — Assessment & Plan Note (Signed)
With meniscus tear.  Continue collaboration, will be having upcoming surgery.

## 2021-03-05 NOTE — Assessment & Plan Note (Signed)
Chronic, stable with BP at goal.  More consistently below goal at this time.  Will continue current medication regimen and adjust as needed.  Discussed with her that if lower readings noted at home, may consider discontinuation of Amlodipine.  CMP and lipid today.  Continue to monitor BP at home regularly and focus on DASH diet.  Plan on follow-up in 6 months.

## 2021-03-05 NOTE — Assessment & Plan Note (Signed)
BMI 32.64.  Recommended eating smaller high protein, low fat meals more frequently and exercising 30 mins a day 5 times a week with a goal of 10-15lb weight loss in the next 3 months. Patient voiced their understanding and motivation to adhere to these recommendations.  

## 2021-03-06 LAB — COMPREHENSIVE METABOLIC PANEL
ALT: 14 IU/L (ref 0–32)
AST: 16 IU/L (ref 0–40)
Albumin/Globulin Ratio: 1.6 (ref 1.2–2.2)
Albumin: 4.6 g/dL (ref 3.8–4.8)
Alkaline Phosphatase: 73 IU/L (ref 44–121)
BUN/Creatinine Ratio: 21 (ref 12–28)
BUN: 14 mg/dL (ref 8–27)
Bilirubin Total: 0.3 mg/dL (ref 0.0–1.2)
CO2: 22 mmol/L (ref 20–29)
Calcium: 9.7 mg/dL (ref 8.7–10.3)
Chloride: 102 mmol/L (ref 96–106)
Creatinine, Ser: 0.66 mg/dL (ref 0.57–1.00)
Globulin, Total: 2.8 g/dL (ref 1.5–4.5)
Glucose: 89 mg/dL (ref 65–99)
Potassium: 4.8 mmol/L (ref 3.5–5.2)
Sodium: 141 mmol/L (ref 134–144)
Total Protein: 7.4 g/dL (ref 6.0–8.5)
eGFR: 97 mL/min/{1.73_m2} (ref >59)

## 2021-03-06 LAB — LIPID PANEL W/O CHOL/HDL RATIO
Cholesterol, Total: 187 mg/dL (ref 100–199)
HDL: 53 mg/dL (ref >39)
LDL Chol Calc (NIH): 119 mg/dL — ABNORMAL HIGH (ref 0–99)
Triglycerides: 80 mg/dL (ref 0–149)
VLDL Cholesterol Cal: 15 mg/dL (ref 5–40)

## 2021-03-06 NOTE — Progress Notes (Signed)
Contacted via MyChart Good morning Mary Wood, your labs have returned and electrolytes + kidney and liver function are all stable.  Cholesterol continues to show some elevation in LDL, with goal for stroke prevention less then 70, your number is 119.  At this time you do not require medication, if LDL becomes 190 or greater OR calculated risk score >10% (you are at 7.8%) then we would discuss this.  Focus on healthy diet and regular activity.  Any questions? Keep being awesome!!  Thank you for allowing me to participate in your care. Kindest regards, Mashelle Busick

## 2021-04-14 ENCOUNTER — Ambulatory Visit
Admission: RE | Admit: 2021-04-14 | Discharge: 2021-04-14 | Disposition: A | Discharge status: Home / Self Care | Payer: Medicare PPO | Source: Ambulatory Visit | Attending: Nurse Practitioner | Admitting: Nurse Practitioner

## 2021-04-14 ENCOUNTER — Other Ambulatory Visit: Payer: Self-pay

## 2021-04-14 DIAGNOSIS — Z1231 Encounter for screening mammogram for malignant neoplasm of breast: Secondary | ICD-10-CM | POA: Insufficient documentation

## 2021-04-14 DIAGNOSIS — M8588 Other specified disorders of bone density and structure, other site: Secondary | ICD-10-CM | POA: Diagnosis present

## 2021-04-14 NOTE — Progress Notes (Signed)
Contacted via MyChart   Good afternoon Mary Wood, your bone density has returned: Your bone density shows thinning bones (osteopenia) but not brittle (osteoporosis). We recommend Vitamin D supplementation of about 2,0000 IUs of over the counter Vitamin D3. In addition, we recommend a diet high in calcium with dairy and dark green leafy vegetables. We would like you to get plenty of weight bearing exercises with walking and resistance training such as light weights or resistance bands available with instructions at places such as [employer].   Keep being awesome!!  Thank you for allowing me to participate in your care. Kindest regards, Teja Judice

## 2021-04-29 ENCOUNTER — Encounter
Admission: RE | Admit: 2021-04-29 | Discharge: 2021-04-29 | Disposition: A | Discharge status: Home / Self Care | Payer: Medicare PPO | Source: Ambulatory Visit | Attending: Orthopedic Surgery | Admitting: Orthopedic Surgery

## 2021-04-29 ENCOUNTER — Other Ambulatory Visit: Payer: Self-pay

## 2021-04-29 DIAGNOSIS — I10 Essential (primary) hypertension: Secondary | ICD-10-CM | POA: Insufficient documentation

## 2021-04-29 DIAGNOSIS — Z01818 Encounter for other preprocedural examination: Secondary | ICD-10-CM | POA: Insufficient documentation

## 2021-04-29 DIAGNOSIS — Z01810 Encounter for preprocedural cardiovascular examination: Secondary | ICD-10-CM

## 2021-04-29 HISTORY — DX: Essential (primary) hypertension: I10

## 2021-04-29 NOTE — Patient Instructions (Signed)
Your procedure is scheduled on: Wed. 5/25 Report to Registration Desk then to Same Day Surg. To find out your arrival time please call [phone] between 1PM - 3PM on Tues 5/24  Remember: Instructions that are not followed completely may result in serious medical risk,  up to and including death, or upon the discretion of your surgeon and anesthesiologist your  surgery may need to be rescheduled.     _X__ 1. Do not eat food after midnight the night before your procedure.                 No chewing gum or hard candies. You may drink clear liquids up to 2 hours                 before you are scheduled to arrive for your surgery- DO not drink clear                 liquids within 2 hours of the start of your surgery.                 Clear Liquids include:  water, apple juice without pulp, clear Gatorade, G2 or                  Gatorade Zero (avoid Red/Purple/Blue), Black Coffee or Tea (Do not add                 anything to coffee or tea). ___x__2.   Complete the "Ensure Clear Pre-surgery Clear Carbohydrate Drink" provided to you, 2 hours before arrival. **If you       are diabetic you will be provided with an alternative drink, Gatorade Zero or G2.  __X__2.  On the morning of surgery brush your teeth with toothpaste and water, you                may rinse your mouth with mouthwash if you wish.  Do not swallow any toothpaste of mouthwash.     _X__ 3.  No Alcohol for 24 hours before or after surgery.   ___ 4.  Do Not Smoke or use e-cigarettes For 24 Hours Prior to Your Surgery.                 Do not use any chewable tobacco products for at least 6 hours prior to                 Surgery.  ___  5.  Do not use any recreational drugs (marijuana, cocaine, heroin, ecstasy, MDMA or other)                For at least one week prior to your surgery.  Combination of these drugs with anesthesia                May have life threatening results.  ____  6.  Bring all  medications with you on the day of surgery if instructed.   __x__  7.  Notify your doctor if there is any change in your medical condition      (cold, fever, infections).     Do not wear jewelry, make-up, hairpins, clips or nail polish. Do not wear lotions, powders, or perfumes. You may wear deodorant. Do not shave 48 hours prior to surgery.  Do not bring valuables to the hospital.    [facility] is not responsible for any belongings or valuables.  Contacts, dentures or bridgework may not be worn into surgery. Leave your  suitcase in the car. After surgery it may be brought to your room. For patients admitted to the hospital, discharge time is determined by your treatment team.   Patients discharged the day of surgery will not be allowed to drive home.   Make arrangements for someone to be with you for the first 24 hours of your Same Day Discharge.    Please read over the following fact sheets that you were given:    ____ Take these medicines the morning of surgery with A SIP OF WATER:    1. acetaminophen (TYLENOL) 650 MG CR tablet if needed  2. amLODipine (NORVASC) 5 MG tablet  3.   4.  5.  6.  ____ Fleet Enema (as directed)   __x__ Use CHG Soap (or wipes) as directed  ____ Use Benzoyl Peroxide Gel as instructed  ____ Use inhalers on the day of surgery  ____ Stop metformin 2 days prior to surgery    ____ Take 1/2 of usual insulin dose the night before surgery. No insulin the morning          of surgery.   ____ Stop Coumadin/Plavix/aspirin on   __x__ Stop Anti-inflammatories on ibuprofen, naproxen glucosamine   ____ Stop supplements until after surgery.    ____ Bring C-Pap to the hospital.    If you have any questions regarding your pre-procedure instructions,  Please call Pre-admit Testing at [phone]

## 2021-04-30 ENCOUNTER — Other Ambulatory Visit: Payer: Medicare PPO

## 2021-05-04 NOTE — H&P (Signed)
ORTHOPAEDIC HISTORY & PHYSICAL Michelene Gardener, [place] - 04/29/2021 8:30 AM EDT Formatting of this note is different from the original. [facility] Chief Complaint:   Chief Complaint  Patient presents with  . Knee Pain  H & P RIGHT KNEE ARTHROSCOPY   History of Present Illness:   Mary Wood is a 65 y.o. female that presents to clinic today for her preoperative history and evaluation. Patient presents unaccompanied. The patient is scheduled to undergo a right knee arthroscopy on 05/07/21 by Dr. Ernest Pine. Her pain began in November 2021 after using a ladder while painting. The pain is located along the posterior aspect of the knee. She describes her pain as worse with going up and down stairs, lateral movements, pivoting, and rising from a seated position. She reports associated swelling with some giving way of the knee. She denies associated numbness or tingling, denies locking.   The patient's symptoms have progressed to the point that they decrease her quality of life. The patient has previously undergone conservative treatment including NSAIDS, Tylenol, activity modification, and injections to the knee without adequate control of her symptoms.  Denies significant cardiac history, history of blood clots, or history of lumbar surgery.  Previous MRI shows complex tear of the right medial meniscus.  Past Medical, Surgical, Family, Social History, Allergies, Medications:   Past Medical History:  Past Medical History:  Diagnosis Date  . Chicken pox  . Hypertension  . Shingles   Past Surgical History:  Past Surgical History:  Procedure Laterality Date  . LEFT ELBOW SURGERY Left 06/25/2011  . TONSILLECTOMY  . WISDOM TEETH   Current Medications:  Current Outpatient Medications  Medication Sig Dispense Refill  . acetaminophen (TYLENOL) 650 MG ER tablet Take 650 mg by mouth every 8 (eight) hours as needed  . amLODIPine (NORVASC) 5 MG tablet  5 mg once daily  . calcium carbonate (CALCIUM 600 ORAL) Take 3 tablets by mouth once daily  . calcium carbonate-vitamin D3 (OS-CAL 500+D) 500 mg(1,250mg ) -200 unit tablet Take 1 tablet by mouth once daily  . cholecalciferol (VITAMIN D3) 1000 unit tablet Take 1,000 Units by mouth once daily  . docusate (COLACE) 100 MG capsule Take 200 mg by mouth 2 (two) times daily as needed  . glucosam-chond-hrb 149-hyal ac 750 mg-100 mg- 125 mg-1.65 mg Tab Take 1 tablet by mouth once daily  . ibuprofen (MOTRIN) 800 MG tablet Take 800 mg by mouth 2 (two) times daily as needed  . losartan (COZAAR) 100 MG tablet 100 mg once daily  . naproxen (NAPROSYN) 500 MG tablet Take 500 mg by mouth 2 (two) times daily as needed   No current facility-administered medications for this visit.   Allergies:  Allergies  Allergen Reactions  . Cat Dander Itching  Itching of eyes   Social History:  Social History   Socioeconomic History  . Marital status: Married  Spouse name: Tammy Sours  . Number of children: 2  . Years of education: 16  . Highest education level: Bachelor's degree (e.g., BA, AB, BS)  Occupational History  . Occupation: Retired- Charity fundraiser  Tobacco Use  . Smoking status: Never Smoker  . Smokeless tobacco: Never Used  Vaping Use  . Vaping Use: Never used  Substance and Sexual Activity  . Alcohol use: Yes  Comment: occasionally  . Drug use: Never  . Sexual activity: Defer  Partners: Male   Family History:  Family History  Problem Relation Age of Onset  . High  blood pressure (Hypertension) Mother  . Stroke Mother  . High blood pressure (Hypertension) Father  . Stroke Father  . High blood pressure (Hypertension) Sister  . High blood pressure (Hypertension) Brother  . Colon cancer Brother   Review of Systems:   A 10+ ROS was performed, reviewed, and the pertinent orthopaedic findings are documented in the HPI.   Physical Examination:   BP (!) 140/98 (BP Location: Left upper arm, Patient Position:  Sitting, BP Cuff Size: Adult)  Ht 170.2 cm (5\' 7" )  Wt 90.5 kg (199 lb 9.6 oz)  BMI 31.26 kg/m   Patient is a well-developed, well-nourished female in no acute distress. Patient has normal mood and affect. Patient is alert and oriented to person, place, and time.   HEENT: Atraumatic, normocephalic. Pupils equal and reactive to light. Extraocular motion intact. Noninjected sclera.  Cardiovascular: Regular rate and rhythm, with no murmurs, rubs, or gallops. Distal pulses palpable.  Respiratory: Lungs clear to auscultation bilaterally.   Right Knee:  Soft tissue swelling: mild Effusion: none Erythema: none Crepitance: mild Tenderness: Medial, posterior Alignment: normal Mediolateral laxity: stable Anterior drawer test:negative Lachman`s test: negative McMurray`s test: positive Atrophy: No significant atrophy.  Quadriceps tone was fair to good. Range of Motion: greater than 120 degrees  Sensation intact over the saphenous, lateral sural cutaneous, superficial fibular, and deep fibular nerve distributions.  Tests Performed/Reviewed:  X-rays  MRI OF THE RIGHT KNEE WITHOUT CONTRAST   TECHNIQUE:  Multiplanar, multisequence MR imaging of the knee was performed. No  intravenous contrast was administered.   COMPARISON: None.   FINDINGS:  MENISCI   Medial meniscus: Complex tear of the posterior horn (series 12,  images 20-22). Peripheral oblique tear at the body/posterior horn  junction (series 11, image 10).   Lateral meniscus: Intact.   LIGAMENTS   Cruciates: Intact ACL and PCL.   Collaterals: Medial collateral ligament is intact. Lateral  collateral ligament complex is intact.   CARTILAGE   Patellofemoral: Full-thickness cartilage loss over the patellar apex  and medial facet.   Medial: High-grade partial-thickness cartilage loss over the central  and posterior weight-bearing medial femoral condyle and central  medial tibial plateau.   Lateral: Focal  partial-thickness cartilage loss over the posterior  lateral tibial plateau.   Joint: Small joint effusion. Minimal edema within Hoffa's fat.   Popliteal Fossa: No Baker cyst. Intact popliteus tendon.   Extensor Mechanism: Intact quadriceps tendon and patellar tendon.  Mild proximal patellar tendinosis. Intact medial and lateral  patellar retinaculum. Intact MPFL.   Bones: Mild degenerative subchondral marrow edema in the medial  compartment. No acute fracture or dislocation. No suspicious bone  lesion.   Other: None.   IMPRESSION:  1. Complex tear of the medial meniscus posterior horn. Additional  peripheral oblique tear at the body/posterior horn junction.  2. Mild tricompartmental osteoarthritis.   Electronically Signed  By: M.D.  On: 02/28/2021 11:57  Impression:   ICD-10-CM  1. Internal derangement of right knee M23.91   Plan:   The patient's symptoms are consistent with symptomatic medial meniscus tear. It was explained to the patient that the condition is progressive in nature. Having failed conservative treatment, the patient has elected to proceed with a right knee arthroscopy. The patient will undergo a right knee arthroscopy with Dr. 03/02/2021. The risks of surgery, including blood clot and infection, were discussed with the patient. The patient elects to proceed with surgery. The patient is instructed to stop all blood thinners prior to  surgery. The patient is instructed to call the hospital the day before surgery to learn of the proper arrival time.   Contact our office with any questions or concerns. Follow up as indicated, or sooner should any new problems arise, if conditions worsen, or if they are otherwise concerned.   Michelene Gardener, PA -C [facility] [address] [place], [place] [address] Phone: [phone]  This note was generated in part with voice recognition software and I apologize  for any typographical errors that were not detected and corrected.  Electronically signed by Michelene Gardener, PA at 04/29/2021 6:00 PM EDT

## 2021-05-05 ENCOUNTER — Other Ambulatory Visit: Payer: Medicare PPO

## 2021-05-06 ENCOUNTER — Other Ambulatory Visit: Payer: Medicare PPO

## 2021-05-06 MED ORDER — LACTATED RINGERS IV SOLN: INTRAVENOUS | Status: DC

## 2021-05-06 MED ORDER — ORAL CARE MOUTH RINSE: 15.0000 mL | Freq: Once | OROMUCOSAL | Status: AC

## 2021-05-06 MED ORDER — FAMOTIDINE 20 MG PO TABS
20.0000 mg | ORAL_TABLET | Freq: Once | ORAL | Status: AC
  Administered 2021-05-07: 20 mg via ORAL

## 2021-05-06 MED ORDER — CELECOXIB 200 MG PO CAPS
400.0000 mg | ORAL_CAPSULE | Freq: Once | ORAL | Status: AC
  Administered 2021-05-07: 400 mg via ORAL

## 2021-05-06 MED ORDER — CHLORHEXIDINE GLUCONATE 0.12 % MT SOLN
15.0000 mL | Freq: Once | OROMUCOSAL | Status: AC
  Administered 2021-05-07: 15 mL via OROMUCOSAL

## 2021-05-07 ENCOUNTER — Ambulatory Visit
Admission: RE | Admit: 2021-05-07 | Discharge: 2021-05-07 | Disposition: A | Discharge status: Home / Self Care | Payer: Medicare PPO | Source: Ambulatory Visit | Attending: Orthopedic Surgery | Admitting: Orthopedic Surgery

## 2021-05-07 ENCOUNTER — Encounter
Admission: RE | Disposition: A | Discharge status: Home / Self Care | Payer: Self-pay | Source: Ambulatory Visit | Attending: Orthopedic Surgery

## 2021-05-07 ENCOUNTER — Ambulatory Visit: Payer: Medicare PPO | Admitting: Anesthesiology

## 2021-05-07 ENCOUNTER — Other Ambulatory Visit: Payer: Self-pay

## 2021-05-07 ENCOUNTER — Encounter: Payer: Self-pay | Admitting: Orthopedic Surgery

## 2021-05-07 DIAGNOSIS — S83221A Peripheral tear of medial meniscus, current injury, right knee, initial encounter: Secondary | ICD-10-CM | POA: Diagnosis not present

## 2021-05-07 DIAGNOSIS — M94261 Chondromalacia, right knee: Secondary | ICD-10-CM | POA: Diagnosis not present

## 2021-05-07 DIAGNOSIS — M2391 Unspecified internal derangement of right knee: Secondary | ICD-10-CM | POA: Diagnosis present

## 2021-05-07 DIAGNOSIS — Z79899 Other long term (current) drug therapy: Secondary | ICD-10-CM | POA: Insufficient documentation

## 2021-05-07 DIAGNOSIS — S83231A Complex tear of medial meniscus, current injury, right knee, initial encounter: Secondary | ICD-10-CM | POA: Insufficient documentation

## 2021-05-07 DIAGNOSIS — X58XXXA Exposure to other specified factors, initial encounter: Secondary | ICD-10-CM | POA: Diagnosis not present

## 2021-05-07 HISTORY — PX: KNEE ARTHROSCOPY: SHX127

## 2021-05-07 SURGERY — ARTHROSCOPY, KNEE
Anesthesia: General | Site: Knee | Laterality: Right

## 2021-05-07 MED ORDER — FAMOTIDINE 20 MG PO TABS
ORAL_TABLET | ORAL | Status: AC
  Filled 2021-05-07: qty 1

## 2021-05-07 MED ORDER — BUPIVACAINE-EPINEPHRINE (PF) 0.25% -1:200000 IJ SOLN
INTRAMUSCULAR | Status: AC
  Filled 2021-05-07: qty 30

## 2021-05-07 MED ORDER — ONDANSETRON HCL 4 MG/2ML IJ SOLN
INTRAMUSCULAR | Status: DC | PRN
  Administered 2021-05-07: 4 mg via INTRAVENOUS

## 2021-05-07 MED ORDER — PHENYLEPHRINE HCL (PRESSORS) 10 MG/ML IV SOLN
INTRAVENOUS | Status: DC | PRN
  Administered 2021-05-07 (×2): 100 ug via INTRAVENOUS

## 2021-05-07 MED ORDER — OXYCODONE HCL 5 MG/5ML PO SOLN: 5.0000 mg | Freq: Once | ORAL | Status: AC | PRN

## 2021-05-07 MED ORDER — ACETAMINOPHEN 10 MG/ML IV SOLN
INTRAVENOUS | Status: DC | PRN
  Administered 2021-05-07: 1000 mg via INTRAVENOUS

## 2021-05-07 MED ORDER — LIDOCAINE HCL (CARDIAC) PF 100 MG/5ML IV SOSY
PREFILLED_SYRINGE | INTRAVENOUS | Status: DC | PRN
  Administered 2021-05-07: 80 mg via INTRAVENOUS

## 2021-05-07 MED ORDER — MORPHINE SULFATE 4 MG/ML IJ SOLN
INTRAMUSCULAR | Status: DC | PRN
  Administered 2021-05-07: 4 mg via SUBCUTANEOUS

## 2021-05-07 MED ORDER — OXYCODONE HCL 5 MG PO TABS
5.0000 mg | ORAL_TABLET | Freq: Once | ORAL | Status: AC | PRN
  Administered 2021-05-07: 5 mg via ORAL

## 2021-05-07 MED ORDER — MIDAZOLAM HCL 2 MG/2ML IJ SOLN
INTRAMUSCULAR | Status: AC
  Filled 2021-05-07: qty 2

## 2021-05-07 MED ORDER — FENTANYL CITRATE (PF) 100 MCG/2ML IJ SOLN
25.0000 ug | INTRAMUSCULAR | Status: DC | PRN
Start: 2021-05-07 — End: 2021-05-08
  Administered 2021-05-07 (×4): 25 ug via INTRAVENOUS

## 2021-05-07 MED ORDER — FENTANYL CITRATE (PF) 100 MCG/2ML IJ SOLN
INTRAMUSCULAR | Status: AC
  Filled 2021-05-07: qty 2

## 2021-05-07 MED ORDER — ONDANSETRON HCL 4 MG/2ML IJ SOLN: 4.0000 mg | Freq: Once | INTRAMUSCULAR | Status: DC | PRN

## 2021-05-07 MED ORDER — CELECOXIB 200 MG PO CAPS
ORAL_CAPSULE | ORAL | Status: AC
  Filled 2021-05-07: qty 2

## 2021-05-07 MED ORDER — ACETAMINOPHEN 10 MG/ML IV SOLN: 1000.0000 mg | Freq: Once | INTRAVENOUS | Status: DC | PRN

## 2021-05-07 MED ORDER — MIDAZOLAM HCL 2 MG/2ML IJ SOLN
INTRAMUSCULAR | Status: DC | PRN
  Administered 2021-05-07: 2 mg via INTRAVENOUS

## 2021-05-07 MED ORDER — PROPOFOL 10 MG/ML IV BOLUS
INTRAVENOUS | Status: DC | PRN
  Administered 2021-05-07: 150 mg via INTRAVENOUS

## 2021-05-07 MED ORDER — OXYCODONE HCL 5 MG PO TABS
ORAL_TABLET | ORAL | Status: AC
  Filled 2021-05-07: qty 1

## 2021-05-07 MED ORDER — ACETAMINOPHEN 10 MG/ML IV SOLN
INTRAVENOUS | Status: AC
  Filled 2021-05-07: qty 100

## 2021-05-07 MED ORDER — CHLORHEXIDINE GLUCONATE 0.12 % MT SOLN
OROMUCOSAL | Status: AC
  Filled 2021-05-07: qty 15

## 2021-05-07 MED ORDER — HYDROCODONE-ACETAMINOPHEN 5-325 MG PO TABS: 1.0000 | ORAL_TABLET | ORAL | 0 refills | Status: DC | PRN

## 2021-05-07 MED ORDER — BUPIVACAINE-EPINEPHRINE 0.25% -1:200000 IJ SOLN
INTRAMUSCULAR | Status: DC | PRN
  Administered 2021-05-07: 5 mL
  Administered 2021-05-07: 25 mL

## 2021-05-07 MED ORDER — DEXAMETHASONE SODIUM PHOSPHATE 10 MG/ML IJ SOLN
INTRAMUSCULAR | Status: DC | PRN
  Administered 2021-05-07: 10 mg via INTRAVENOUS

## 2021-05-07 MED ORDER — MORPHINE SULFATE (PF) 4 MG/ML IV SOLN
INTRAVENOUS | Status: AC
  Filled 2021-05-07: qty 1

## 2021-05-07 MED ORDER — FENTANYL CITRATE (PF) 100 MCG/2ML IJ SOLN
INTRAMUSCULAR | Status: DC | PRN
  Administered 2021-05-07 (×2): 50 ug via INTRAVENOUS

## 2021-05-07 SURGICAL SUPPLY — 29 items
ADAPTER IRRIG TUBE 2 SPIKE SOL (ADAPTER) ×4 IMPLANT
ADPR TBG 2 SPK PMP STRL ASCP (ADAPTER) ×2
BLADE SHAVER 4.5 DBL SERAT CV (CUTTER) ×1 IMPLANT
COVER WAND RF STERILE (DRAPES) ×2 IMPLANT
CUFF TOURN SGL QUICK 24 (TOURNIQUET CUFF) ×2
CUFF TOURN SGL QUICK 34 (TOURNIQUET CUFF)
CUFF TRNQT CYL 24X4X16.5-23 (TOURNIQUET CUFF) IMPLANT
CUFF TRNQT CYL 34X4.125X (TOURNIQUET CUFF) IMPLANT
DRAPE ARTHRO LIMB 89X125 STRL (DRAPES) ×2 IMPLANT
DRSG DERMACEA 8X12 NADH (GAUZE/BANDAGES/DRESSINGS) ×2 IMPLANT
DURAPREP 26ML APPLICATOR (WOUND CARE) ×4 IMPLANT
GAUZE SPONGE 4X4 12PLY STRL (GAUZE/BANDAGES/DRESSINGS) ×2 IMPLANT
GLOVE SURG ENC TEXT LTX SZ7.5 (GLOVE) ×2 IMPLANT
GLOVE SURG UNDER LTX SZ8 (GLOVE) ×2 IMPLANT
GOWN STRL REUS W/ TWL LRG LVL3 (GOWN DISPOSABLE) ×2 IMPLANT
GOWN STRL REUS W/TWL LRG LVL3 (GOWN DISPOSABLE) ×4
IV LACTATED RINGER IRRG 3000ML (IV SOLUTION) ×12
IV LR IRRIG 3000ML ARTHROMATIC (IV SOLUTION) ×6 IMPLANT
KIT TURNOVER KIT A (KITS) ×2 IMPLANT
MANIFOLD NEPTUNE II (INSTRUMENTS) ×4 IMPLANT
PACK ARTHROSCOPY KNEE (MISCELLANEOUS) ×2 IMPLANT
SET TUBE SUCT SHAVER OUTFL 24K (TUBING) ×2 IMPLANT
SOL PREP PVP 2OZ (MISCELLANEOUS) ×2
SOLUTION PREP PVP 2OZ (MISCELLANEOUS) ×1 IMPLANT
SUT ETHILON 3-0 FS-10 30 BLK (SUTURE) ×2
SUTURE EHLN 3-0 FS-10 30 BLK (SUTURE) ×1 IMPLANT
TUBING ARTHRO INFLOW-ONLY STRL (TUBING) ×2 IMPLANT
WAND HAND CNTRL MULTIVAC 50 (MISCELLANEOUS) ×2 IMPLANT
WRAP KNEE W/COLD PACKS 25.5X14 (SOFTGOODS) ×2 IMPLANT

## 2021-05-07 NOTE — Anesthesia Preprocedure Evaluation (Signed)
Anesthesia Evaluation  Patient identified by MRN, date of birth, ID band Patient awake    Reviewed: Allergy & Precautions, NPO status , Patient's Chart, lab work & pertinent test results  History of Anesthesia Complications Negative for: history of anesthetic complications  Airway Mallampati: II  TM Distance: >3 FB Neck ROM: Full    Dental no notable dental hx. (+) Teeth Intact   Pulmonary neg pulmonary ROS, neg sleep apnea, neg COPD, Patient abstained from smoking.Not current smoker,    Pulmonary exam normal breath sounds clear to auscultation       Cardiovascular Exercise Tolerance: Good METShypertension, Pt. on medications (-) CAD and (-) Past MI (-) dysrhythmias  Rhythm:Regular Rate:Normal - Systolic murmurs    Neuro/Psych negative neurological ROS  negative psych ROS   GI/Hepatic neg GERD  ,(+)     substance abuse  alcohol use,   Endo/Other  neg diabetes  Renal/GU negative Renal ROS     Musculoskeletal  (+) Arthritis , Osteoarthritis,    Abdominal   Peds  Hematology   Anesthesia Other Findings Past Medical History: No date: Hypertension No date: Osteoporosis  Reproductive/Obstetrics                             Anesthesia Physical Anesthesia Plan  ASA: II  Anesthesia Plan: General   Post-op Pain Management:    Induction: Intravenous  PONV Risk Score and Plan: 3 and Ondansetron and Dexamethasone  Airway Management Planned: LMA  Additional Equipment: None  Intra-op Plan:   Post-operative Plan: Extubation in OR  Informed Consent: I have reviewed the patients History and Physical, chart, labs and discussed the procedure including the risks, benefits and alternatives for the proposed anesthesia with the patient or authorized representative who has indicated his/her understanding and acceptance.     Dental advisory given  Plan Discussed with: CRNA and  Surgeon  Anesthesia Plan Comments: (Discussed risks of anesthesia with patient, including PONV, sore throat, lip/dental damage. Rare risks discussed as well, such as cardiorespiratory and neurological sequelae. Patient understands.)        Anesthesia Quick Evaluation

## 2021-05-07 NOTE — Transfer of Care (Signed)
Immediate Anesthesia Transfer of Care Note  Patient: Mary Wood  Procedure(s) Performed: ARTHROSCOPY KNEE (Right Knee)  Patient Location: PACU  Anesthesia Type:General  Level of Consciousness: awake, alert , oriented and patient cooperative  Airway & Oxygen Therapy: Patient Spontanous Breathing and Patient connected to face mask oxygen  Post-op Assessment: Report given to RN and Post -op Vital signs reviewed and stable  Post vital signs: Reviewed and stable  Last Vitals:  Vitals Value Taken Time  BP 128/83 05/07/21 1815  Temp    Pulse 74 05/07/21 1818  Resp 17 05/07/21 1818  SpO2 100 % 05/07/21 1818  Vitals shown include unvalidated device data.  Last Pain:  Vitals:   05/07/21 1337  TempSrc: Temporal  PainSc: 3          Complications: No complications documented.

## 2021-05-07 NOTE — Anesthesia Postprocedure Evaluation (Signed)
Anesthesia Post Note  Patient: Mary Wood  Procedure(s) Performed: ARTHROSCOPY KNEE (Right Knee)  Patient location during evaluation: PACU Anesthesia Type: General Level of consciousness: awake and alert Pain management: pain level controlled Vital Signs Assessment: post-procedure vital signs reviewed and stable Respiratory status: spontaneous breathing and respiratory function stable Cardiovascular status: stable Anesthetic complications: no   No complications documented.   Last Vitals:  Vitals:   05/07/21 1337 05/07/21 1815  BP: (!) 158/94 128/83  Pulse: 82 (!) 148  Resp: 18 15  Temp: (!) 36.4 C   SpO2: 99% 100%    Last Pain:  Vitals:   05/07/21 1337  TempSrc: Temporal  PainSc: 3                  Abed Schar K

## 2021-05-07 NOTE — H&P (Signed)
The patient has been re-examined, and the chart reviewed, and there have been no interval changes to the documented history and physical.    The risks, benefits, and alternatives have been discussed at length. The patient expressed understanding of the risks benefits and agreed with plans for surgical intervention.  Jermel Artley P. Ksean Vale, Jr. M.D.    

## 2021-05-07 NOTE — Op Note (Signed)
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   ASSISTANT: none  ANESTHESIA: general  ESTIMATED BLOOD LOSS: Minimal  FLUIDS REPLACED: 800 mL of crystalloid  TOURNIQUET TIME: Not used  INDICATIONS FOR SURGERY: Mary Wood is a 65 y.o. year old female who has been seen for complaints of right knee pain. MRI demonstrated findings consistent with meniscal pathology. After discussion of the risks and benefits of surgical intervention, the patient expressed understanding of the risks benefits and agree with plans for right knee arthroscopy.   PROCEDURE IN DETAIL: The patient was brought into the operating room and, after adequate general anesthesia was achieved, a tourniquet was applied to the right thigh and the leg was placed in the leg holder. All bony prominences were well padded. The patient's right knee was cleaned and prepped with alcohol and Duraprep and draped in the usual sterile fashion. A "timeout" was performed as per usual protocol. The anticipated portal sites were injected with 0.25% Marcaine with epinephrine. An anterolateral incision was made and a cannula was inserted. A moderate effusion was evacuated and the knee was distended with fluid using the pump. The scope was advanced down the medial gutter into the medial compartment. Under visualization with the scope, an anteromedial portal was created and a hooked probe was inserted. The medial meniscus was visualized and probed.  There was a complex lesion with a flap component noted to the posterior horn the medial meniscus.  The  tear was debrided using meniscal punches and a 4.5 mm incisor shaver.  Final contouring was performed using a 50 degree ArthroCare wand.  The remaining room meniscus was visualized and probed and felt be stable.  The articular cartilage was visualized.  There were grade 3 changes of chondromalacia noted to the posterior aspect of the medial femoral condyle and lesser changes to the medial tibial plateau.  These areas were debrided and contoured using the ArthroCare wand.  The scope was then advanced into the intercondylar notch. The anterior cruciate ligament was visualized and probed and felt to be intact. The scope was removed from the lateral portal and reinserted via the anteromedial portal to better visualize the lateral compartment. The lateral meniscus was visualized and probed.  There was no evidence of tear or instability.  The articular cartilage of the lateral compartment was visualized.  A small localized area of grade II-III chondromalacia was noted to the lateral femoral condyle.  The area was debrided using the ArthroCare wand.  Finally, the scope was advanced so as to visualize the patellofemoral articulation. Good patellar tracking was appreciated.  The articular surface was in good condition.  The knee was irrigated with copius amounts of fluid and suctioned dry. The anterolateral portal was re-approximated with #3-0 nylon. A combination of 0.25% Marcaine with epinephrine and 4 mg of Morphine were injected via the scope. The scope was removed and the anteromedial portal was re-approximated with #3-0 nylon. A sterile dressing was applied followed by application of an ice wrap.  The patient tolerated the procedure well and was transported to the PACU in stable condition.  Mary Wood,  Mary Hageman., M.D.

## 2021-05-07 NOTE — Anesthesia Procedure Notes (Signed)
Procedure Name: LMA Insertion Date/Time: 05/07/2021 5:01 PM Performed by: Junious Silk, CRNA Pre-anesthesia Checklist: Patient identified, Patient being monitored, Timeout performed, Emergency Drugs available and Suction available Patient Re-evaluated:Patient Re-evaluated prior to induction Oxygen Delivery Method: Circle system utilized Preoxygenation: Pre-oxygenation with 100% oxygen Induction Type: IV induction Ventilation: Mask ventilation without difficulty LMA: LMA inserted LMA Size: 4.0 Tube type: Oral Number of attempts: 1 Placement Confirmation: positive ETCO2 and breath sounds checked- equal and bilateral Tube secured with: Tape Dental Injury: Teeth and Oropharynx as per pre-operative assessment

## 2021-05-07 NOTE — Discharge Instructions (Signed)
AMBULATORY SURGERY  DISCHARGE INSTRUCTIONS   1) The drugs that you were given will stay in your system until tomorrow so for the next 24 hours you should not:  A) Drive an automobile B) Make any legal decisions C) Drink any alcoholic beverage   2) You may resume regular meals tomorrow.  Today it is better to start with liquids and gradually work up to solid foods.  You may eat anything you prefer, but it is better to start with liquids, then soup and crackers, and gradually work up to solid foods.   3) Please notify your doctor immediately if you have any unusual bleeding, trouble breathing, redness and pain at the surgery site, drainage, fever, or pain not relieved by medication.    4) Additional Instructions:        Please contact your physician with any problems or Same Day Surgery at [phone], Monday through Friday 6 am to 4 pm, or [place] [facility] Main number at [phone]. Instructions after Knee Arthroscopy    James P. Hooten, Jr., M.D.     Dept. of Orthopaedics & Sports Medicine  [facility]  [address]  [place], [place]  [address]   Phone: [phone]   Fax: [phone]   DIET: . Drink plenty of non-alcoholic fluids & begin a light diet. . Resume your normal diet the day after surgery.  ACTIVITY:  . You may use crutches or a walker with weight-bearing as tolerated, unless instructed otherwise. . You may wean yourself off of the walker or crutches as tolerated.  . Begin doing gentle exercises. Exercising will reduce the pain and swelling, increase motion, and prevent muscle weakness.   . Avoid strenuous activities or athletics for a minimum of 4-6 weeks after arthroscopic surgery. . Do not drive or operate any equipment until instructed.  WOUND CARE:  . Place one to two pillows under the knee the first day or two when sitting or lying.  . Continue to use the ice packs periodically to reduce pain and swelling. . The small incisions  in your knee are closed with nylon stitches. The stitches will be removed in the office. . The bulky dressing may be removed on the second day after surgery. DO NOT TOUCH THE STITCHES. Put a Band-Aid over each stitch. Do NOT use any ointments or creams on the incisions.  . You may bathe or shower after the stitches are removed at the first office visit following surgery.  MEDICATIONS: . You may resume your regular medications. . Please take the pain medication as prescribed. . Do not take pain medication on an empty stomach. . Do not drive or drink alcoholic beverages when taking pain medications.  CALL THE OFFICE FOR: . Temperature above 101 degrees . Excessive bleeding or drainage on the dressing. . Excessive swelling, coldness, or paleness of the toes. . Persistent nausea and vomiting.  FOLLOW-UP:  . You should have an appointment to return to the office in 7-10 days after surgery.      [facility] Department Directory         [email or web address]       [email or web address]          Cardiology  Appointments: [place] - [phone] [place] - [phone]  Endocrinology  Appointments: [place] - [phone] [place] - [phone]  Gastroenterology  Appointments: [employer] - [phone] [place] - [phone]        General Surgery   Appointments: [place] - [phone]  Internal Medicine/Family Medicine  Appointments: [place] - [phone]   [place] - [phone] [place] - [phone]  Metabolic and Weigh Loss Surgery  Appointments: [place] - [phone]        Neurology  Appointments: [place] - [phone] [place] - [phone]  Neurosurgery  Appointments: [place] - [phone]  Obstetrics & Gynecology  Appointments: [place] - [phone] [place] - [phone]        Pediatrics  Appointments: [place] - [phone] [place] - [phone]  Physiatry  Appointments: [place]  -[phone]  Physical Therapy  Appointments: [place] - [phone] [place] - [phone]        Podiatry  Appointments: [place] - [phone] [place] - [phone]  Pulmonology  Appointments: [place] - [phone]  Rheumatology  Appointments: [place] - [phone]        [place] Location: [facility]  [address] [place], [place]  [address]  [place] Location: [facility] [address] [place], [place]  [address]  [place] Location: [facility] [address] [place], [place]  [address]               

## 2021-05-08 ENCOUNTER — Encounter: Payer: Self-pay | Admitting: Orthopedic Surgery

## 2021-07-03 ENCOUNTER — Other Ambulatory Visit: Payer: Self-pay | Admitting: Nurse Practitioner

## 2021-07-03 NOTE — Telephone Encounter (Signed)
Requested medications are due for refill today.  yes  Requested medications are on the active medications list.  yes  Last refill. 04/23/2020  Future visit scheduled.   no  Notes to clinic.  Prescription is expired.

## 2021-07-08 NOTE — Telephone Encounter (Signed)
Lvm to make this med refill apt. 

## 2021-07-09 NOTE — Telephone Encounter (Signed)
Pt has apt on 07/22/2021 

## 2021-07-14 ENCOUNTER — Ambulatory Visit: Payer: Medicare PPO | Admitting: Nurse Practitioner

## 2021-07-22 ENCOUNTER — Other Ambulatory Visit: Payer: Self-pay

## 2021-07-22 ENCOUNTER — Ambulatory Visit: Payer: Medicare PPO | Admitting: Nurse Practitioner

## 2021-07-22 ENCOUNTER — Encounter: Payer: Self-pay | Admitting: Nurse Practitioner

## 2021-07-22 VITALS — BP 132/75 | HR 85 | Temp 98.6°F | Wt 198.2 lb

## 2021-07-22 DIAGNOSIS — E7800 Pure hypercholesterolemia, unspecified: Secondary | ICD-10-CM

## 2021-07-22 DIAGNOSIS — I10 Essential (primary) hypertension: Secondary | ICD-10-CM

## 2021-07-22 DIAGNOSIS — E6609 Other obesity due to excess calories: Secondary | ICD-10-CM

## 2021-07-22 DIAGNOSIS — Z6831 Body mass index (BMI) 31.0-31.9, adult: Secondary | ICD-10-CM | POA: Diagnosis not present

## 2021-07-22 MED ORDER — AMLODIPINE BESYLATE 5 MG PO TABS: 5.0000 mg | ORAL_TABLET | Freq: Every day | ORAL | 4 refills | Status: DC

## 2021-07-22 MED ORDER — LOSARTAN POTASSIUM 100 MG PO TABS: 100.0000 mg | ORAL_TABLET | Freq: Every day | ORAL | 4 refills | Status: DC

## 2021-07-22 NOTE — Progress Notes (Signed)
BP 132/75 (BP Location: Left Leg, Cuff Size: Normal)   Pulse 85   Temp 98.6 F (37 C) (Oral)   Wt 198 lb 3.2 oz (89.9 kg)   LMP  (LMP Unknown)   SpO2 97%   BMI 31.04 kg/m    Subjective:    Patient ID: Mary Wood, female    DOB: Aug 21, 1956, 65 y.o.   MRN: [id number]  HPI: Mary Wood is a 65 y.o. female  Chief Complaint  Patient presents with   Medication Management    Patient is here for a medication refill. Patient denies having any concerns at today's visit.    HYPERTENSION/HLD Continues on Losartan 100 MG and Amlodipine 5 MG daily.  Hypertension status: controlled  Satisfied with current treatment? yes Duration of hypertension: chronic BP monitoring frequency:  occasionally BP range: 136/72 yesterday BP medication side effects:  no Medication compliance: good compliance Aspirin: no Recurrent headaches: no Visual changes: no Palpitations: no Dyspnea: no Chest pain: no Lower extremity edema: no Dizzy/lightheaded: no  The 10-year ASCVD risk score Mikey Bussing DC Jr., et al., 2013) is: 9.6%   Values used to calculate the score:     Age: 14 years     Sex: Female     Is Non-Hispanic African American: No     Diabetic: No     Tobacco smoker: No     Systolic Blood Pressure: 102 mmHg     Is BP treated: Yes     HDL Cholesterol: 53 mg/dL     Total Cholesterol: 187 mg/dL  Relevant past medical, surgical, family and social history reviewed and updated as indicated. Interim medical history since our last visit reviewed. Allergies and medications reviewed and updated.  Review of Systems  Constitutional:  Negative for activity change, appetite change, diaphoresis, fatigue and fever.  Respiratory:  Negative for cough, chest tightness and shortness of breath.   Cardiovascular:  Negative for chest pain, palpitations and leg swelling.  Gastrointestinal: Negative.   Neurological: Negative.   Psychiatric/Behavioral: Negative.     Per HPI unless specifically indicated above      Objective:    BP 132/75 (BP Location: Left Leg, Cuff Size: Normal)   Pulse 85   Temp 98.6 F (37 C) (Oral)   Wt 198 lb 3.2 oz (89.9 kg)   LMP  (LMP Unknown)   SpO2 97%   BMI 31.04 kg/m   Wt Readings from Last 3 Encounters:  07/22/21 198 lb 3.2 oz (89.9 kg)  05/07/21 196 lb (88.9 kg)  04/29/21 200 lb 4.8 oz (90.9 kg)    Physical Exam Vitals and nursing note reviewed.  Constitutional:      General: She is awake. She is not in acute distress.    Appearance: She is well-developed and well-groomed. She is obese. She is not ill-appearing.  HENT:     Head: Normocephalic.     Right Ear: Hearing normal.     Left Ear: Hearing normal.  Eyes:     General: Lids are normal.        Right eye: No discharge.        Left eye: No discharge.     Conjunctiva/sclera: Conjunctivae normal.  Pulmonary:     Effort: Pulmonary effort is normal. No accessory muscle usage or respiratory distress.  Musculoskeletal:     Cervical back: Normal range of motion.  Neurological:     Mental Status: She is alert and oriented to person, place, and time.  Psychiatric:  Attention and Perception: Attention normal.        Mood and Affect: Mood normal.        Behavior: Behavior normal. Behavior is cooperative.        Thought Content: Thought content normal.        Judgment: Judgment normal.   Results for orders placed or performed in visit on 03/05/21  Comprehensive metabolic panel  Result Value Ref Range   Glucose 89 65 - 99 mg/dL   BUN 14 8 - 27 mg/dL   Creatinine, Ser 0.66 0.57 - 1.00 mg/dL   eGFR 97 >59 mL/min/1.73   BUN/Creatinine Ratio 21 12 - 28   Sodium 141 134 - 144 mmol/L   Potassium 4.8 3.5 - 5.2 mmol/L   Chloride 102 96 - 106 mmol/L   CO2 22 20 - 29 mmol/L   Calcium 9.7 8.7 - 10.3 mg/dL   Total Protein 7.4 6.0 - 8.5 g/dL   Albumin 4.6 3.8 - 4.8 g/dL   Globulin, Total 2.8 1.5 - 4.5 g/dL   Albumin/Globulin Ratio 1.6 1.2 - 2.2   Bilirubin Total 0.3 0.0 - 1.2 mg/dL   Alkaline  Phosphatase 73 44 - 121 IU/L   AST 16 0 - 40 IU/L   ALT 14 0 - 32 IU/L  Lipid Panel w/o Chol/HDL Ratio  Result Value Ref Range   Cholesterol, Total 187 100 - 199 mg/dL   Triglycerides 80 0 - 149 mg/dL   HDL 53 >39 mg/dL   VLDL Cholesterol Cal 15 5 - 40 mg/dL   LDL Chol Calc (NIH) 119 (H) 0 - 99 mg/dL      Assessment & Plan:   Problem List Items Addressed This Visit       Cardiovascular and Mediastinum   Hypertension - Primary    Chronic, stable with BP at goal in office and at home. Will continue current medication regimen and adjust as needed.  Discussed with her that if lower readings noted at home, may consider discontinuation of Amlodipine.  CMP and TSH today.  Continue to monitor BP at home regularly and focus on DASH diet.  Plan on follow-up in 6 months.       Relevant Medications   losartan (COZAAR) 100 MG tablet   amLODipine (NORVASC) 5 MG tablet   Other Relevant Orders   Comprehensive metabolic panel   TSH     Other   Obesity    BMI 31.04. Recommended eating smaller high protein, low fat meals more frequently and exercising 30 mins a day 5 times a week with a goal of 10-15lb weight loss in the next 3 months. Patient voiced their understanding and motivation to adhere to these recommendations.        Elevated LDL cholesterol level    No current medications.  Recheck levels next visit fasting.  Current ASCVD 9.6%.  Continue focus on diet and regular exercise.  Initiate statin as needed.         Follow up plan: Return in about 6 months (around 01/22/2022) for Annual physical.

## 2021-07-22 NOTE — Assessment & Plan Note (Signed)
No current medications.  Recheck levels next visit fasting.  Current ASCVD 9.6%.  Continue focus on diet and regular exercise.  Initiate statin as needed.

## 2021-07-22 NOTE — Patient Instructions (Signed)
https://www.nhlbi.nih.gov/files/docs/public/heart/dash_brief.pdf">  DASH Eating Plan DASH stands for Dietary Approaches to Stop Hypertension. The DASH eating plan is a healthy eating plan that has been shown to: Reduce high blood pressure (hypertension). Reduce your risk for type 2 diabetes, heart disease, and stroke. Help with weight loss. What are tips for following this plan? Reading food labels Check food labels for the amount of salt (sodium) per serving. Choose foods with less than 5 percent of the Daily Value of sodium. Generally, foods with less than 300 milligrams (mg) of sodium per serving fit into this eating plan. To find whole grains, look for the word "whole" as the first word in the ingredient list. Shopping Buy products labeled as "low-sodium" or "no salt added." Buy fresh foods. Avoid canned foods and pre-made or frozen meals. Cooking Avoid adding salt when cooking. Use salt-free seasonings or herbs instead of table salt or sea salt. Check with your health care provider or pharmacist before using salt substitutes. Do not fry foods. Cook foods using healthy methods such as baking, boiling, grilling, roasting, and broiling instead. Cook with heart-healthy oils, such as olive, canola, avocado, soybean, or sunflower oil. Meal planning  Eat a balanced diet that includes: 4 or more servings of fruits and 4 or more servings of vegetables each day. Try to fill one-half of your plate with fruits and vegetables. 6-8 servings of whole grains each day. Less than 6 oz (170 g) of lean meat, poultry, or fish each day. A 3-oz (85-g) serving of meat is about the same size as a deck of cards. One egg equals 1 oz (28 g). 2-3 servings of low-fat dairy each day. One serving is 1 cup (237 mL). 1 serving of nuts, seeds, or beans 5 times each week. 2-3 servings of heart-healthy fats. Healthy fats called omega-3 fatty acids are found in foods such as walnuts, flaxseeds, fortified milks, and eggs.  These fats are also found in cold-water fish, such as sardines, salmon, and mackerel. Limit how much you eat of: Canned or prepackaged foods. Food that is high in trans fat, such as some fried foods. Food that is high in saturated fat, such as fatty meat. Desserts and other sweets, sugary drinks, and other foods with added sugar. Full-fat dairy products. Do not salt foods before eating. Do not eat more than 4 egg yolks a week. Try to eat at least 2 vegetarian meals a week. Eat more home-cooked food and less restaurant, buffet, and fast food.  Lifestyle When eating at a restaurant, ask that your food be prepared with less salt or no salt, if possible. If you drink alcohol: Limit how much you use to: 0-1 drink a day for women who are not pregnant. 0-2 drinks a day for men. Be aware of how much alcohol is in your drink. In the [place] one drink equals one 12 oz bottle of beer (355 mL), one 5 oz glass of wine (148 mL), or one 1 oz glass of hard liquor (44 mL). General information Avoid eating more than 2,300 mg of salt a day. If you have hypertension, you may need to reduce your sodium intake to 1,500 mg a day. Work with your health care provider to maintain a healthy body weight or to lose weight. Ask what an ideal weight is for you. Get at least 30 minutes of exercise that causes your heart to beat faster (aerobic exercise) most days of the week. Activities may include walking, swimming, or biking. Work with your health care provider   or dietitian to adjust your eating plan to your individual calorie needs. What foods should I eat? Fruits All fresh, dried, or frozen fruit. Canned fruit in natural juice (without addedsugar). Vegetables Fresh or frozen vegetables (raw, steamed, roasted, or grilled). Low-sodium or reduced-sodium tomato and vegetable juice. Low-sodium or reduced-sodium tomatosauce and tomato paste. Low-sodium or reduced-sodium canned vegetables. Grains Whole-grain or  whole-wheat bread. Whole-grain or whole-wheat pasta. Brown rice. Oatmeal. Quinoa. Bulgur. Whole-grain and low-sodium cereals. Pita bread.Low-fat, low-sodium crackers. Whole-wheat flour tortillas. Meats and other proteins Skinless chicken or turkey. Ground chicken or turkey. Pork with fat trimmed off. Fish and seafood. Egg whites. Dried beans, peas, or lentils. Unsalted nuts, nut butters, and seeds. Unsalted canned beans. Lean cuts of beef with fat trimmed off. Low-sodium, lean precooked or cured meat, such as sausages or meatloaves. Dairy Low-fat (1%) or fat-free (skim) milk. Reduced-fat, low-fat, or fat-free cheeses. Nonfat, low-sodium ricotta or cottage cheese. Low-fat or nonfatyogurt. Low-fat, low-sodium cheese. Fats and oils Soft margarine without trans fats. Vegetable oil. Reduced-fat, low-fat, or light mayonnaise and salad dressings (reduced-sodium). Canola, safflower, olive, avocado, soybean, andsunflower oils. Avocado. Seasonings and condiments Herbs. Spices. Seasoning mixes without salt. Other foods Unsalted popcorn and pretzels. Fat-free sweets. The items listed above may not be a complete list of foods and beverages you can eat. Contact a dietitian for more information. What foods should I avoid? Fruits Canned fruit in a light or heavy syrup. Fried fruit. Fruit in cream or buttersauce. Vegetables Creamed or fried vegetables. Vegetables in a cheese sauce. Regular canned vegetables (not low-sodium or reduced-sodium). Regular canned tomato sauce and paste (not low-sodium or reduced-sodium). Regular tomato and vegetable juice(not low-sodium or reduced-sodium). Pickles. Olives. Grains Baked goods made with fat, such as croissants, muffins, or some breads. Drypasta or rice meal packs. Meats and other proteins Fatty cuts of meat. Ribs. Fried meat. Bacon. Bologna, salami, and other precooked or cured meats, such as sausages or meat loaves. Fat from the back of a pig (fatback). Bratwurst.  Salted nuts and seeds. Canned beans with added salt. Canned orsmoked fish. Whole eggs or egg yolks. Chicken or turkey with skin. Dairy Whole or 2% milk, cream, and half-and-half. Whole or full-fat cream cheese. Whole-fat or sweetened yogurt. Full-fat cheese. Nondairy creamers. Whippedtoppings. Processed cheese and cheese spreads. Fats and oils Butter. Stick margarine. Lard. Shortening. Ghee. Bacon fat. Tropical oils, suchas coconut, palm kernel, or palm oil. Seasonings and condiments Onion salt, garlic salt, seasoned salt, table salt, and sea salt. Worcestershire sauce. Tartar sauce. Barbecue sauce. Teriyaki sauce. Soy sauce, including reduced-sodium. Steak sauce. Canned and packaged gravies. Fish sauce. Oyster sauce. Cocktail sauce. Store-bought horseradish. Ketchup. Mustard. Meat flavorings and tenderizers. Bouillon cubes. Hot sauces. Pre-made or packaged marinades. Pre-made or packaged taco seasonings. Relishes. Regular saladdressings. Other foods Salted popcorn and pretzels. The items listed above may not be a complete list of foods and beverages you should avoid. Contact a dietitian for more information. Where to find more information National Heart, Lung, and Blood Institute: www.nhlbi.nih.gov American Heart Association: www.heart.org Academy of Nutrition and Dietetics: www.eatright.org National Kidney Foundation: www.kidney.org Summary The DASH eating plan is a healthy eating plan that has been shown to reduce high blood pressure (hypertension). It may also reduce your risk for type 2 diabetes, heart disease, and stroke. When on the DASH eating plan, aim to eat more fresh fruits and vegetables, whole grains, lean proteins, low-fat dairy, and heart-healthy fats. With the DASH eating plan, you should limit salt (sodium) intake to 2,300   mg a day. If you have hypertension, you may need to reduce your sodium intake to 1,500 mg a day. Work with your health care provider or dietitian to adjust  your eating plan to your individual calorie needs. This information is not intended to replace advice given to you by your health care provider. Make sure you discuss any questions you have with your healthcare provider. Document Revised: 11/03/2019 Document Reviewed: 11/03/2019 Elsevier Patient Education  2022 Elsevier Inc.  

## 2021-07-22 NOTE — Assessment & Plan Note (Signed)
Chronic, stable with BP at goal in office and at home. Will continue current medication regimen and adjust as needed.  Discussed with her that if lower readings noted at home, may consider discontinuation of Amlodipine.  CMP and TSH today.  Continue to monitor BP at home regularly and focus on DASH diet.  Plan on follow-up in 6 months.

## 2021-07-22 NOTE — Assessment & Plan Note (Signed)
BMI 31.04.  Recommended eating smaller high protein, low fat meals more frequently and exercising 30 mins a day 5 times a week with a goal of 10-15lb weight loss in the next 3 months. Patient voiced their understanding and motivation to adhere to these recommendations.  

## 2021-07-23 LAB — COMPREHENSIVE METABOLIC PANEL
ALT: 23 IU/L (ref 0–32)
AST: 16 IU/L (ref 0–40)
Albumin/Globulin Ratio: 1.6 (ref 1.2–2.2)
Albumin: 4.6 g/dL (ref 3.8–4.8)
Alkaline Phosphatase: 85 IU/L (ref 44–121)
BUN/Creatinine Ratio: 21 (ref 12–28)
BUN: 15 mg/dL (ref 8–27)
Bilirubin Total: 0.4 mg/dL (ref 0.0–1.2)
CO2: 25 mmol/L (ref 20–29)
Calcium: 10.1 mg/dL (ref 8.7–10.3)
Chloride: 101 mmol/L (ref 96–106)
Creatinine, Ser: 0.73 mg/dL (ref 0.57–1.00)
Globulin, Total: 2.8 g/dL (ref 1.5–4.5)
Glucose: 99 mg/dL (ref 65–99)
Potassium: 5.1 mmol/L (ref 3.5–5.2)
Sodium: 139 mmol/L (ref 134–144)
Total Protein: 7.4 g/dL (ref 6.0–8.5)
eGFR: 90 mL/min/{1.73_m2} (ref >59)

## 2021-07-23 LAB — TSH: TSH: 1.05 u[IU]/mL (ref 0.450–4.500)

## 2021-07-23 NOTE — Progress Notes (Signed)
Contacted via MyChart   Good morning Mary Wood, labs have returned and overall they continue to look fantastic.  No changes needed.  Have a great day!! Keep being awesome!!  Thank you for allowing me to participate in your care.  I appreciate you. Kindest regards, Callum Wolf

## 2021-09-03 ENCOUNTER — Telehealth: Payer: Self-pay | Admitting: Nurse Practitioner

## 2021-09-03 NOTE — Telephone Encounter (Signed)
Copied from CRM [id number]. Topic: Medicare AWV >> Sep 03, 2021  2:19 PM Leigh Aurora wrote: Reason for CRM:  Left message for patient to call back and schedule Medicare Annual Wellness Visit (AWV) to be done virtually or by telephone.  No hx of AWV eligible as of 04/13/21  Please schedule at anytime with [facility]-Nurse Health Advisor.      45 Minutes appointment   Any questions, please call me at [phone]

## 2021-10-08 ENCOUNTER — Other Ambulatory Visit: Payer: Self-pay

## 2021-10-08 ENCOUNTER — Ambulatory Visit (INDEPENDENT_AMBULATORY_CARE_PROVIDER_SITE_OTHER): Payer: Medicare PPO

## 2021-10-08 DIAGNOSIS — Z23 Encounter for immunization: Secondary | ICD-10-CM | POA: Diagnosis not present

## 2021-12-18 ENCOUNTER — Ambulatory Visit (INDEPENDENT_AMBULATORY_CARE_PROVIDER_SITE_OTHER): Payer: Medicare PPO | Admitting: *Deleted

## 2021-12-18 DIAGNOSIS — Z0000 Encounter for general adult medical examination without abnormal findings: Secondary | ICD-10-CM

## 2021-12-18 NOTE — Progress Notes (Signed)
Subjective:   Mary HurtDian Wood is a 66 y.o. female who presents for Medicare Annual (Subsequent) preventive examination.  I connected with  Mary Wood on 12/18/21 by a telephone enabled telemedicine application and verified that I am speaking with the correct person using two identifiers.   I discussed the limitations of evaluation and management by telemedicine. The patient expressed understanding and agreed to proceed.  Patient location: home  Provider location:  Tele-Health  not in office    Review of Systems     Cardiac Risk Factors include: advanced age (>3255men, 85>65 women);hypertension;obesity (BMI >30kg/m2)     Objective:    Today's Vitals   12/18/21 0816  PainSc: 3    There is no height or weight on file to calculate BMI.  Advanced Directives 12/18/2021 04/29/2021  Does Patient Have a Medical Advance Directive? No No  Would patient like information on creating a medical advance directive? No - Patient declined -    Current Medications (verified) Outpatient Encounter Medications as of 12/18/2021  Medication Sig   acetaminophen (TYLENOL) 650 MG CR tablet Take 650 mg by mouth every 8 (eight) hours as needed for pain.   amLODipine (NORVASC) 5 MG tablet Take 1 tablet (5 mg total) by mouth daily.   Calcium Citrate-Vitamin D (CALCIUM + D PO) Take 3 tablets by mouth daily. 1200 mg Calcium   ibuprofen (ADVIL,MOTRIN) 800 MG tablet Take 800 mg by mouth every 8 (eight) hours as needed for moderate pain.   losartan (COZAAR) 100 MG tablet Take 1 tablet (100 mg total) by mouth daily.   naproxen (NAPROSYN) 500 MG tablet Take 1 tablet (500 mg total) by mouth 2 (two) times daily with a meal.   UNABLE TO FIND Bioflex   Misc Natural Products (GLUCOSAMINE CHONDROITIN ADV PO) Take 1 tablet by mouth daily. (Patient not taking: Reported on 12/18/2021)   No facility-administered encounter medications on file as of 12/18/2021.    Allergies (verified) Cat hair extract   History: Past Medical  History:  Diagnosis Date   Hypertension    Osteoporosis    Past Surgical History:  Procedure Laterality Date   FRACTURE SURGERY Right    wrist   GANGLION CYST EXCISION Left 1994   JOINT REPLACEMENT Left 07/05/2011   elbow   KNEE ARTHROSCOPY Right 05/07/2021   Procedure: ARTHROSCOPY KNEE;  Surgeon: Donato HeinzHooten, James P, MD;  Location: [facility] ORS;  Service: Orthopedics;  Laterality: Right;   Family History  Problem Relation Age of Onset   Heart disease Mother    Hypertension Mother    Depression Mother    Alcohol abuse Father    COPD Father    Alcohol abuse Brother    Cancer Brother        colon   Hyperthyroidism Sister    Hypertension Sister    Cancer Maternal Grandmother    Heart disease Maternal Grandfather    Hypertension Brother    Depression Brother    Osteoporosis Brother    Social History   Socioeconomic History   Marital status: Married    Spouse name: Not on file   Number of children: Not on file   Years of education: Not on file   Highest education level: Not on file  Occupational History   Not on file  Tobacco Use   Smoking status: Never   Smokeless tobacco: Never  Vaping Use   Vaping Use: Never used  Substance and Sexual Activity   Alcohol use: Yes    Alcohol/week: 0.0  standard drinks    Comment: on occasion   Drug use: No   Sexual activity: Yes  Other Topics Concern   Not on file  Social History Narrative   Not on file   Social Determinants of Health   Financial Resource Strain: Low Risk    Difficulty of Paying Living Expenses: Not hard at all  Food Insecurity: No Food Insecurity   Worried About Programme researcher, broadcasting/film/videounning Out of Food in the Last Year: Never true   Ran Out of Food in the Last Year: Never true  Transportation Needs: No Transportation Needs   Lack of Transportation (Medical): No   Lack of Transportation (Non-Medical): No  Physical Activity: Insufficiently Active   Days of Exercise per Week: 3 days   Minutes of Exercise per Session: 30 min  Stress:  No Stress Concern Present   Feeling of Stress : Not at all  Social Connections: Moderately Integrated   Frequency of Communication with Friends and Family: More than three times a week   Frequency of Social Gatherings with Friends and Family: Three times a week   Attends Religious Services: Never   Active Member of Clubs or Organizations: Yes   Attends Engineer, structuralClub or Organization Meetings: More than 4 times per year   Marital Status: Married    Tobacco Counseling Counseling given: Not Answered   Clinical Intake:     Pain : 0-10 Pain Score: 3  Pain Location: Knee Pain Orientation: Left Pain Descriptors / Indicators: Burning, Constant Pain Onset: More than a month ago     Nutritional Risks: None  How often do you need to have someone help you when you read instructions, pamphlets, or other written materials from your doctor or pharmacy?: 1 - Never  Diabetic?  Interpreter Needed?: No  Information entered by :: Mary HaggardJulie Cadi Rhinehart LPN   Activities of Daily Living In your present state of health, do you have any difficulty performing the following activities: 12/18/2021 04/29/2021  Hearing? N N  Vision? N N  Difficulty concentrating or making decisions? N N  Walking or climbing stairs? N N  Dressing or bathing? N N  Doing errands, shopping? N N  Preparing Food and eating ? N -  Using the Toilet? N -  In the past six months, have you accidently leaked urine? N -  Managing your Medications? N -  Managing your Finances? N -  Housekeeping or managing your Housekeeping? N -  Some recent data might be hidden    Patient Care Team: Marjie Skiffannady, Jolene T, NP as PCP - General (Nurse Practitioner)  Indicate any recent Medical Services you may have received from other than [facility] providers in the past year (date may be approximate).     Assessment:   This is a routine wellness examination for Mary Wood.  Hearing/Vision screen Hearing Screening - Comments:: No trouble hearing Vision Screening -  Comments:: Not up to date  [place] [facility]  Dietary issues and exercise activities discussed: Current Exercise Habits: Home exercise routine, Type of exercise: walking, Time (Minutes): 30, Frequency (Times/Week): 3, Weekly Exercise (Minutes/Week): 90, Intensity: Mild   Goals Addressed             This Visit's Progress    Weight (lb) < 200 lb (90.7 kg)       Be more active       Depression Screen PHQ 2/9 Scores 12/18/2021 11/19/2020 08/26/2020 01/09/2019 10/02/2015  PHQ - 2 Score 0 0 0 0 2  PHQ- 9 Score - - - 0  4    Fall Risk Fall Risk  12/18/2021 11/19/2020 08/26/2020 06/20/2019 01/09/2019  Falls in the past year? 0 0 0 0 0  Number falls in past yr: 0 0 0 0 0  Injury with Fall? 0 0 0 0 0  Risk for fall due to : - - No Fall Risks - -  Follow up Falls evaluation completed;Falls prevention discussed Falls evaluation completed Falls evaluation completed Falls evaluation completed Falls evaluation completed    FALL RISK PREVENTION PERTAINING TO THE HOME:  Any stairs in or around the home? Yes  If so, are there any without handrails? No  Home free of loose throw rugs in walkways, pet beds, electrical cords, etc? Yes  Adequate lighting in your home to reduce risk of falls? Yes   ASSISTIVE DEVICES UTILIZED TO PREVENT FALLS:  Life alert? No  Use of a cane, walker or w/c? No  Grab bars in the bathroom? No  Shower chair or bench in shower? Yes  Elevated toilet seat or a handicapped toilet? No   TIMED UP AND GO:  Was the test performed? No .    Cognitive Function:  Normal cognitive status assessed by direct observation by this Nurse Health Advisor. No abnormalities found.          Immunizations Immunization History  Administered Date(s) Administered   Fluad Quad(high Dose 65+) 08/26/2020, 10/08/2021   Influenza-Unspecified 09/06/2017, 09/14/2018, 08/23/2019   Pneumococcal Conjugate-13 01/29/2020   Pneumococcal Polysaccharide-23 10/08/2021   Td 08/01/1996, 01/28/2007   Tdap  09/06/2017   Zoster Recombinat (Shingrix) 02/20/2019   Zoster, Live 10/02/2015    TDAP status: Up to date  Flu Vaccine status: Up to date  Pneumococcal vaccine status: Up to date  Covid-19 vaccine status: Declined, Education has been provided regarding the importance of this vaccine but patient still declined. Advised may receive this vaccine at local pharmacy or Health Dept.or vaccine clinic. Aware to provide a copy of the vaccination record if obtained from local pharmacy or Health Dept. Verbalized acceptance and understanding.  Qualifies for Shingles Vaccine? Yes   Zostavax completed Yes   Shingrix Completed?: No.    Education has been provided regarding the importance of this vaccine. Patient has been advised to call insurance company to determine out of pocket expense if they have not yet received this vaccine. Advised may also receive vaccine at local pharmacy or Health Dept. Verbalized acceptance and understanding.  Screening Tests Health Maintenance  Topic Date Due   Zoster Vaccines- Shingrix (2 of 2) 04/17/2019   COVID-19 Vaccine (1) 01/03/2022 (Originally 01/13/1957)   MAMMOGRAM  04/15/2023   TETANUS/TDAP  09/07/2027   COLONOSCOPY (Pts 45-59yrs Insurance coverage will need to be confirmed)  03/28/2028   Pneumonia Vaccine 21+ Years old  Completed   INFLUENZA VACCINE  Completed   DEXA SCAN  Completed   Hepatitis C Screening  Completed   HPV VACCINES  Aged Out    Health Maintenance  Health Maintenance Due  Topic Date Due   Zoster Vaccines- Shingrix (2 of 2) 04/17/2019    Colorectal cancer screening: Type of screening: Colonoscopy. Completed 2019. Repeat every 10 years  Mammogram status: Completed 2022. Repeat every year  Bone Density status: Completed 2022. Results reflect: Bone density results: OSTEOPENIA. Repeat every   years.  Lung Cancer Screening: (Low Dose CT Chest recommended if Age 3-80 years, 30 pack-year currently smoking OR have quit w/in 15years.) does  not qualify.   Lung Cancer Screening Referral:   Additional Screening:  Hepatitis C Screening: does not qualify; Completed 2016  Vision Screening: Recommended annual ophthalmology exams for early detection of glaucoma and other disorders of the eye. Is the patient up to date with their annual eye exam?  No  Who is the provider or what is the name of the office in which the patient attends annual eye exams? [facility] does not remember name If pt is not established with a provider, would they like to be referred to a provider to establish care? No .   Dental Screening: Recommended annual dental exams for proper oral hygiene  Community Resource Referral / Chronic Care Management: CRR required this visit?  No   CCM required this visit?  No      Plan:     I have personally reviewed and noted the following in the patients chart:   Medical and social history Use of alcohol, tobacco or illicit drugs  Current medications and supplements including opioid prescriptions.  Functional ability and status Nutritional status Physical activity Advanced directives List of other physicians Hospitalizations, surgeries, and ER visits in previous 12 months Vitals Screenings to include cognitive, depression, and falls Referrals and appointments  In addition, I have reviewed and discussed with patient certain preventive protocols, quality metrics, and best practice recommendations. A written personalized care plan for preventive services as well as general preventive health recommendations were provided to patient.     Mary Haggard, LPN   [id number]/12/[id number]   Nurse Notes:

## 2021-12-18 NOTE — Patient Instructions (Signed)
Mary Wood , Thank you for taking time to come for your Medicare Wellness Visit. I appreciate your ongoing commitment to your health goals. Please review the following plan we discussed and let me know if I can assist you in the future.   Screening recommendations/referrals: Colonoscopy: up to date Mammogram: up to date Bone Density: up to date Recommended yearly ophthalmology/optometry visit for glaucoma screening and checkup Recommended yearly dental visit for hygiene and checkup  Vaccinations: Influenza vaccine: up to date Pneumococcal vaccine: up to date Tdap vaccine: up to date Shingles vaccine: 1 of 2    Advanced directives: Education provided  Conditions/risks identified:   Next appointment: 01-22-2022 @ 9:00  [place]   Preventive Care 65 Years and Older, Female Preventive care refers to lifestyle choices and visits with your health care provider that can promote health and wellness. What does preventive care include? A yearly physical exam. This is also called an annual well check. Dental exams once or twice a year. Routine eye exams. Ask your health care provider how often you should have your eyes checked. Personal lifestyle choices, including: Daily care of your teeth and gums. Regular physical activity. Eating a healthy diet. Avoiding tobacco and drug use. Limiting alcohol use. Practicing safe sex. Taking low-dose aspirin every day. Taking vitamin and mineral supplements as recommended by your health care provider. What happens during an annual well check? The services and screenings done by your health care provider during your annual well check will depend on your age, overall health, lifestyle risk factors, and family history of disease. Counseling  Your health care provider may ask you questions about your: Alcohol use. Tobacco use. Drug use. Emotional well-being. Home and relationship well-being. Sexual activity. Eating habits. History of falls. Memory  and ability to understand (cognition). Work and work Astronomer. Reproductive health. Screening  You may have the following tests or measurements: Height, weight, and BMI. Blood pressure. Lipid and cholesterol levels. These may be checked every 5 years, or more frequently if you are over 69 years old. Skin check. Lung cancer screening. You may have this screening every year starting at age 33 if you have a 30-pack-year history of smoking and currently smoke or have quit within the past 15 years. Fecal occult blood test (FOBT) of the stool. You may have this test every year starting at age 63. Flexible sigmoidoscopy or colonoscopy. You may have a sigmoidoscopy every 5 years or a colonoscopy every 10 years starting at age 86. Hepatitis C blood test. Hepatitis B blood test. Sexually transmitted disease (STD) testing. Diabetes screening. This is done by checking your blood sugar (glucose) after you have not eaten for a while (fasting). You may have this done every 1-3 years. Bone density scan. This is done to screen for osteoporosis. You may have this done starting at age 30. Mammogram. This may be done every 1-2 years. Talk to your health care provider about how often you should have regular mammograms. Talk with your health care provider about your test results, treatment options, and if necessary, the need for more tests. Vaccines  Your health care provider may recommend certain vaccines, such as: Influenza vaccine. This is recommended every year. Tetanus, diphtheria, and acellular pertussis (Tdap, Td) vaccine. You may need a Td booster every 10 years. Zoster vaccine. You may need this after age 73. Pneumococcal 13-valent conjugate (PCV13) vaccine. One dose is recommended after age 70. Pneumococcal polysaccharide (PPSV23) vaccine. One dose is recommended after age 19. Talk to your health  care provider about which screenings and vaccines you need and how often you need them. This  information is not intended to replace advice given to you by your health care provider. Make sure you discuss any questions you have with your health care provider. Document Released: 12/27/2015 Document Revised: 08/19/2016 Document Reviewed: 10/01/2015 Elsevier Interactive Patient Education  2017 [place] Prevention in the Home Falls can cause injuries. They can happen to people of all ages. There are many things you can do to make your home safe and to help prevent falls. What can I do on the outside of my home? Regularly fix the edges of walkways and driveways and fix any cracks. Remove anything that might make you trip as you walk through a door, such as a raised step or threshold. Trim any bushes or trees on the path to your home. Use bright outdoor lighting. Clear any walking paths of anything that might make someone trip, such as rocks or tools. Regularly check to see if handrails are loose or broken. Make sure that both sides of any steps have handrails. Any raised decks and porches should have guardrails on the edges. Have any leaves, snow, or ice cleared regularly. Use sand or salt on walking paths during winter. Clean up any spills in your garage right away. This includes oil or grease spills. What can I do in the bathroom? Use night lights. Install grab bars by the toilet and in the tub and shower. Do not use towel bars as grab bars. Use non-skid mats or decals in the tub or shower. If you need to sit down in the shower, use a plastic, non-slip stool. Keep the floor dry. Clean up any water that spills on the floor as soon as it happens. Remove soap buildup in the tub or shower regularly. Attach bath mats securely with double-sided non-slip rug tape. Do not have throw rugs and other things on the floor that can make you trip. What can I do in the bedroom? Use night lights. Make sure that you have a light by your bed that is easy to reach. Do not use any sheets or  blankets that are too big for your bed. They should not hang down onto the floor. Have a firm chair that has side arms. You can use this for support while you get dressed. Do not have throw rugs and other things on the floor that can make you trip. What can I do in the kitchen? Clean up any spills right away. Avoid walking on wet floors. Keep items that you use a lot in easy-to-reach places. If you need to reach something above you, use a strong step stool that has a grab bar. Keep electrical cords out of the way. Do not use floor polish or wax that makes floors slippery. If you must use wax, use non-skid floor wax. Do not have throw rugs and other things on the floor that can make you trip. What can I do with my stairs? Do not leave any items on the stairs. Make sure that there are handrails on both sides of the stairs and use them. Fix handrails that are broken or loose. Make sure that handrails are as long as the stairways. Check any carpeting to make sure that it is firmly attached to the stairs. Fix any carpet that is loose or worn. Avoid having throw rugs at the top or bottom of the stairs. If you do have throw rugs, attach them to  the floor with carpet tape. Make sure that you have a light switch at the top of the stairs and the bottom of the stairs. If you do not have them, ask someone to add them for you. What else can I do to help prevent falls? Wear shoes that: Do not have high heels. Have rubber bottoms. Are comfortable and fit you well. Are closed at the toe. Do not wear sandals. If you use a stepladder: Make sure that it is fully opened. Do not climb a closed stepladder. Make sure that both sides of the stepladder are locked into place. Ask someone to hold it for you, if possible. Clearly mark and make sure that you can see: Any grab bars or handrails. First and last steps. Where the edge of each step is. Use tools that help you move around (mobility aids) if they are  needed. These include: Canes. Walkers. Scooters. Crutches. Turn on the lights when you go into a dark area. Replace any light bulbs as soon as they burn out. Set up your furniture so you have a clear path. Avoid moving your furniture around. If any of your floors are uneven, fix them. If there are any pets around you, be aware of where they are. Review your medicines with your doctor. Some medicines can make you feel dizzy. This can increase your chance of falling. Ask your doctor what other things that you can do to help prevent falls. This information is not intended to replace advice given to you by your health care provider. Make sure you discuss any questions you have with your health care provider. Document Released: 09/26/2009 Document Revised: 05/07/2016 Document Reviewed: 01/04/2015 Elsevier Interactive Patient Education  2017 [employer].

## 2022-01-22 ENCOUNTER — Encounter: Payer: Medicare PPO | Admitting: Nurse Practitioner

## 2022-01-25 NOTE — Patient Instructions (Signed)

## 2022-01-27 ENCOUNTER — Encounter: Payer: Self-pay | Admitting: Nurse Practitioner

## 2022-01-27 ENCOUNTER — Ambulatory Visit (INDEPENDENT_AMBULATORY_CARE_PROVIDER_SITE_OTHER): Payer: Medicare PPO | Admitting: Nurse Practitioner

## 2022-01-27 ENCOUNTER — Other Ambulatory Visit: Payer: Self-pay

## 2022-01-27 VITALS — BP 125/79 | HR 79 | Temp 98.1°F | Ht 66.25 in | Wt 203.0 lb

## 2022-01-27 DIAGNOSIS — Z6831 Body mass index (BMI) 31.0-31.9, adult: Secondary | ICD-10-CM | POA: Diagnosis not present

## 2022-01-27 DIAGNOSIS — Z0000 Encounter for general adult medical examination without abnormal findings: Secondary | ICD-10-CM

## 2022-01-27 DIAGNOSIS — E6609 Other obesity due to excess calories: Secondary | ICD-10-CM

## 2022-01-27 DIAGNOSIS — I10 Essential (primary) hypertension: Secondary | ICD-10-CM

## 2022-01-27 DIAGNOSIS — M8588 Other specified disorders of bone density and structure, other site: Secondary | ICD-10-CM | POA: Diagnosis not present

## 2022-01-27 DIAGNOSIS — E7800 Pure hypercholesterolemia, unspecified: Secondary | ICD-10-CM

## 2022-01-27 LAB — MICROALBUMIN, URINE WAIVED
Creatinine, Urine Waived: 100 mg/dL (ref 10–300)
Microalb, Ur Waived: 10 mg/L (ref 0–19)
Microalb/Creat Ratio: <30 mg/g (ref <30)

## 2022-01-27 MED ORDER — LOSARTAN POTASSIUM 100 MG PO TABS
100.0000 mg | ORAL_TABLET | Freq: Every day | ORAL | 4 refills | Status: DC
Start: 2022-01-27 — End: 2022-10-19

## 2022-01-27 MED ORDER — AMLODIPINE BESYLATE 5 MG PO TABS: 5.0000 mg | ORAL_TABLET | Freq: Every day | ORAL | 4 refills | Status: DC

## 2022-01-27 NOTE — Assessment & Plan Note (Signed)
No current medications.  Recheck levels next visit fasting.  Current ASCVD 8.6%.  Continue focus on diet and regular exercise.  Initiate statin as needed.

## 2022-01-27 NOTE — Assessment & Plan Note (Signed)
BMI 32.52.  Recommended eating smaller high protein, low fat meals more frequently and exercising 30 mins a day 5 times a week with a goal of 10-15lb weight loss in the next 3 months. Patient voiced their understanding and motivation to adhere to these recommendations. ° °

## 2022-01-27 NOTE — Progress Notes (Signed)
BP 125/79    Pulse 79    Temp 98.1 F (36.7 C) (Oral)    Ht 5' 6.25" (1.683 m)    Wt 203 lb (92.1 kg)    LMP  (LMP Unknown)    SpO2 95%    BMI 32.52 kg/m    Subjective:    Patient ID: Mary Wood, female    DOB: [birth year], 66 y.o.   MRN: [id number]  HPI: Mary Wood is a 66 y.o. female presenting on 01/27/2022 for comprehensive medical examination. Current medical complaints include:none  She currently lives with: husband Menopausal Symptoms: no   HYPERTENSION Continues on Losartan 100 MG and Amlodipine 5 MG daily. Her husband and her are working on weight loss.  Hypertension status: controlled  Satisfied with current treatment? yes Duration of hypertension: chronic BP monitoring frequency:  rarely BP range: 130/80 range BP medication side effects:  no Medication compliance: good compliance Aspirin: no Recurrent headaches: no -- occasional headache due to stressors Visual changes: no Palpitations: no Dyspnea: no Chest pain: no Lower extremity edema: no Dizzy/lightheaded: no  The 10-year ASCVD risk score (Arnett DK, et al., 2019) is: 8.6%   Values used to calculate the score:     Age: 79 years     Sex: Female     Is Non-Hispanic African American: No     Diabetic: No     Tobacco smoker: No     Systolic Blood Pressure: 211 mmHg     Is BP treated: Yes     HDL Cholesterol: 53 mg/dL     Total Cholesterol: 187 mg/dL  OSTEOPENIA Continues on daily supplements with last DEXA 04/14/21 with The BMD measured at [facility] L1-L4 (L3) is 0.945 g/cm2 with a T-score of -1.9.  Satisfied with current treatment?: yes Adequate calcium & vitamin D: yes Weight bearing exercises: yes   Depression Screen done today and results listed below:  Depression screen [facility] 2/9 01/27/2022 12/18/2021 11/19/2020 08/26/2020 01/09/2019  Decreased Interest 0 0 0 0 0  Down, Depressed, Hopeless 0 0 0 0 0  PHQ - 2 Score 0 0 0 0 0  Altered sleeping 1 - - - 0  Tired, decreased energy 1 - - - 0  Change in  appetite 1 - - - 0  Feeling bad or failure about yourself  0 - - - 0  Trouble concentrating 0 - - - 0  Moving slowly or fidgety/restless 0 - - - 0  Suicidal thoughts 0 - - - 0  PHQ-9 Score 3 - - - 0  Difficult doing work/chores - - - - Not difficult at all    Fall Risk 08/26/2020 11/19/2020 12/18/2021 01/27/2022 01/27/2022  Falls in the past year? 0 0 0 0 0  Was there an injury with Fall? 0 0 0 0 0  Fall Risk Category Calculator 0 0 0 0 0  Fall Risk Category Low Low Low Low Low  Patient Fall Risk Level Low fall risk Low fall risk - Low fall risk Low fall risk  Patient at Risk for Falls Due to No Fall Risks - - No Fall Risks No Fall Risks  Fall risk Follow up Falls evaluation completed Falls evaluation completed Falls evaluation completed;Falls prevention discussed Falls evaluation completed Falls prevention discussed    Functional Status Survey: Is the patient deaf or have difficulty hearing?: No Does the patient have difficulty seeing, even when wearing glasses/contacts?: No Does the patient have difficulty concentrating, remembering, or making decisions?: No Does the  patient have difficulty walking or climbing stairs?: No Does the patient have difficulty dressing or bathing?: No Does the patient have difficulty doing errands alone such as visiting a doctor's office or shopping?: No   Past Medical History:  Past Medical History:  Diagnosis Date   Hypertension    Osteoporosis     Surgical History:  Past Surgical History:  Procedure Laterality Date   FRACTURE SURGERY Right    wrist   GANGLION CYST EXCISION Left 1994   JOINT REPLACEMENT Left 07/05/2011   elbow   KNEE ARTHROSCOPY Right 05/07/2021   Procedure: ARTHROSCOPY KNEE;  Surgeon: Dereck Leep, MD;  Location: [facility] ORS;  Service: Orthopedics;  Laterality: Right;    Medications:  Current Outpatient Medications on File Prior to Visit  Medication Sig   acetaminophen (TYLENOL) 650 MG CR tablet Take 650 mg by mouth every 8  (eight) hours as needed for pain.   Calcium Citrate-Vitamin D (CALCIUM + D PO) Take 3 tablets by mouth daily. 1200 mg Calcium   ibuprofen (ADVIL,MOTRIN) 800 MG tablet Take 800 mg by mouth every 8 (eight) hours as needed for moderate pain.   Misc Natural Products (OSTEO BI-FLEX/5-LOXIN ADVANCED) TABS Take 1 tablet by mouth daily.   naproxen (NAPROSYN) 500 MG tablet Take 1 tablet (500 mg total) by mouth 2 (two) times daily with a meal.   UNABLE TO FIND Bioflex   No current facility-administered medications on file prior to visit.    Allergies:  Allergies  Allergen Reactions   Cat Hair Extract     Stuffy nose itchy eyes    Social History:  Social History   Socioeconomic History   Marital status: Married    Spouse name: Not on file   Number of children: Not on file   Years of education: Not on file   Highest education level: Not on file  Occupational History   Not on file  Tobacco Use   Smoking status: Never   Smokeless tobacco: Never  Vaping Use   Vaping Use: Never used  Substance and Sexual Activity   Alcohol use: Yes    Alcohol/week: 0.0 standard drinks    Comment: on occasion   Drug use: No   Sexual activity: Yes  Other Topics Concern   Not on file  Social History Narrative   Not on file   Social Determinants of Health   Financial Resource Strain: Low Risk    Difficulty of Paying Living Expenses: Not hard at all  Food Insecurity: No Food Insecurity   Worried About Charity fundraiser in the Last Year: Never true   [place] in the Last Year: Never true  Transportation Needs: No Transportation Needs   Lack of Transportation (Medical): No   Lack of Transportation (Non-Medical): No  Physical Activity: Insufficiently Active   Days of Exercise per Week: 3 days   Minutes of Exercise per Session: 30 min  Stress: No Stress Concern Present   Feeling of Stress : Not at all  Social Connections: Moderately Integrated   Frequency of Communication with Friends and  Family: More than three times a week   Frequency of Social Gatherings with Friends and Family: Three times a week   Attends Religious Services: Never   Active Member of Clubs or Organizations: Yes   Attends Music therapist: More than 4 times per year   Marital Status: Married  Human resources officer Violence: Not At Risk   Fear of Current or Ex-Partner: No  Emotionally Abused: No   Physically Abused: No   Sexually Abused: No   Social History   Tobacco Use  Smoking Status Never  Smokeless Tobacco Never   Social History   Substance and Sexual Activity  Alcohol Use Yes   Alcohol/week: 0.0 standard drinks   Comment: on occasion    Family History:  Family History  Problem Relation Age of Onset   Heart disease Mother    Hypertension Mother    Depression Mother    Alcohol abuse Father    COPD Father    Alcohol abuse Brother    Cancer Brother        colon   Hyperthyroidism Sister    Hypertension Sister    Cancer Maternal Grandmother    Heart disease Maternal Grandfather    Hypertension Brother    Depression Brother    Osteoporosis Brother     Past medical history, surgical history, medications, allergies, family history and social history reviewed with patient today and changes made to appropriate areas of the chart.   Review of Systems - negative All other ROS negative except what is listed above and in the HPI.      Objective:    BP 125/79    Pulse 79    Temp 98.1 F (36.7 C) (Oral)    Ht 5' 6.25" (1.683 m)    Wt 203 lb (92.1 kg)    LMP  (LMP Unknown)    SpO2 95%    BMI 32.52 kg/m   Wt Readings from Last 3 Encounters:  01/27/22 203 lb (92.1 kg)  07/22/21 198 lb 3.2 oz (89.9 kg)  05/07/21 196 lb (88.9 kg)    Physical Exam Vitals and nursing note reviewed.  Constitutional:      General: She is awake. She is not in acute distress.    Appearance: She is well-developed. She is not ill-appearing.  HENT:     Head: Normocephalic and atraumatic.      Right Ear: Hearing, tympanic membrane, ear canal and external ear normal. No drainage.     Left Ear: Hearing, tympanic membrane, ear canal and external ear normal. No drainage.     Nose: Nose normal.     Right Sinus: No maxillary sinus tenderness or frontal sinus tenderness.     Left Sinus: No maxillary sinus tenderness or frontal sinus tenderness.     Mouth/Throat:     Mouth: Mucous membranes are moist.     Pharynx: Oropharynx is clear. Uvula midline. No pharyngeal swelling, oropharyngeal exudate or posterior oropharyngeal erythema.  Eyes:     General: Lids are normal.        Right eye: No discharge.        Left eye: No discharge.     Extraocular Movements: Extraocular movements intact.     Conjunctiva/sclera: Conjunctivae normal.     Pupils: Pupils are equal, round, and reactive to light.     Visual Fields: Right eye visual fields normal and left eye visual fields normal.  Neck:     Thyroid: No thyromegaly.     Vascular: No carotid bruit.     Trachea: Trachea normal.  Cardiovascular:     Rate and Rhythm: Normal rate and regular rhythm.     Heart sounds: Normal heart sounds. No murmur heard.   No gallop.  Pulmonary:     Effort: Pulmonary effort is normal. No accessory muscle usage or respiratory distress.     Breath sounds: Normal breath sounds.  Chest:  Breasts:  Right: Normal.     Left: Normal.  Abdominal:     General: Bowel sounds are normal.     Palpations: Abdomen is soft. There is no hepatomegaly or splenomegaly.     Tenderness: There is no abdominal tenderness.  Musculoskeletal:        General: Normal range of motion.     Cervical back: Normal range of motion and neck supple.     Right lower leg: No edema.     Left lower leg: No edema.  Lymphadenopathy:     Head:     Right side of head: No submental, submandibular, tonsillar, preauricular or posterior auricular adenopathy.     Left side of head: No submental, submandibular, tonsillar, preauricular or posterior  auricular adenopathy.     Cervical: No cervical adenopathy.     Upper Body:     Right upper body: No supraclavicular, axillary or pectoral adenopathy.     Left upper body: No supraclavicular, axillary or pectoral adenopathy.  Skin:    General: Skin is warm and dry.     Capillary Refill: Capillary refill takes less than 2 seconds.     Findings: No rash.  Neurological:     Mental Status: She is alert and oriented to person, place, and time.     Gait: Gait is intact.     Deep Tendon Reflexes: Reflexes are normal and symmetric.     Reflex Scores:      Brachioradialis reflexes are 2+ on the right side and 2+ on the left side.      Patellar reflexes are 2+ on the right side and 2+ on the left side. Psychiatric:        Attention and Perception: Attention normal.        Mood and Affect: Mood normal.        Speech: Speech normal.        Behavior: Behavior normal. Behavior is cooperative.        Thought Content: Thought content normal.        Judgment: Judgment normal.   Results for orders placed or performed in visit on 07/22/21  Comprehensive metabolic panel  Result Value Ref Range   Glucose 99 65 - 99 mg/dL   BUN 15 8 - 27 mg/dL   Creatinine, Ser 0.73 0.57 - 1.00 mg/dL   eGFR 90 >59 mL/min/1.73   BUN/Creatinine Ratio 21 12 - 28   Sodium 139 134 - 144 mmol/L   Potassium 5.1 3.5 - 5.2 mmol/L   Chloride 101 96 - 106 mmol/L   CO2 25 20 - 29 mmol/L   Calcium 10.1 8.7 - 10.3 mg/dL   Total Protein 7.4 6.0 - 8.5 g/dL   Albumin 4.6 3.8 - 4.8 g/dL   Globulin, Total 2.8 1.5 - 4.5 g/dL   Albumin/Globulin Ratio 1.6 1.2 - 2.2   Bilirubin Total 0.4 0.0 - 1.2 mg/dL   Alkaline Phosphatase 85 44 - 121 IU/L   AST 16 0 - 40 IU/L   ALT 23 0 - 32 IU/L  TSH  Result Value Ref Range   TSH 1.050 0.450 - 4.500 uIU/mL      Assessment & Plan:   Problem List Items Addressed This Visit       Cardiovascular and Mediastinum   Hypertension - Primary    Chronic, stable with BP at goal in office and at  home. Will continue current medication regimen and adjust as needed.  Discussed with her that if lower readings noted at  home, may consider discontinuation of Amlodipine.  CMP, CBC, urine ALB, and TSH today.  Continue to monitor BP at home regularly and focus on DASH diet.  Plan on follow-up in 6 months.      Relevant Medications   amLODipine (NORVASC) 5 MG tablet   losartan (COZAAR) 100 MG tablet   Other Relevant Orders   Microalbumin, Urine Waived   CBC with Differential/Platelet   Comprehensive metabolic panel   TSH     Musculoskeletal and Integument   Osteopenia of lumbar spine    Chronic, ongoing with some improvement recent scan.  Return in 6 months.  Vit D level today.  Next scan May 2027.      Relevant Orders   VITAMIN D 25 Hydroxy (Vit-D Deficiency, Fractures)     Other   Elevated LDL cholesterol level    No current medications.  Recheck levels next visit fasting.  Current ASCVD 8.6%.  Continue focus on diet and regular exercise.  Initiate statin as needed.      Relevant Orders   Comprehensive metabolic panel   Lipid Panel w/o Chol/HDL Ratio   Obesity    BMI 32.52. Recommended eating smaller high protein, low fat meals more frequently and exercising 30 mins a day 5 times a week with a goal of 10-15lb weight loss in the next 3 months. Patient voiced their understanding and motivation to adhere to these recommendations.       Other Visit Diagnoses     Encounter for annual physical exam       Annual physical today with labs and health maintenance reviewed        Follow up plan: Return in about 6 months (around 07/27/2022) for HTN AND HLD.   LABORATORY TESTING:  - Pap smear: not applicable -- last pap 1093 was negative HPV and cytology  IMMUNIZATIONS:  -- declines Covid vaccine, discussed with her and she feels it has not had enough research - Tdap: Tetanus vaccination status reviewed: last tetanus booster within 10 years. - Influenza: Up To Date - Pneumovax:  Up to date - Prevnar: Up to date - HPV: Not applicable - Zostavax vaccine: Up to date x 1, needs second one - Covid = has not received  SCREENING: -Mammogram: Up to date = 04/14/2021 last - Colonoscopy: Up to date -- due 03/28/2028 - Bone Density: Up to date  -Hearing Test: Not applicable  -Spirometry: Not applicable   PATIENT COUNSELING:   Advised to take 1 mg of folate supplement per day if capable of pregnancy.   Sexuality: Discussed sexually transmitted diseases, partner selection, use of condoms, avoidance of unintended pregnancy  and contraceptive alternatives.   Advised to avoid cigarette smoking.  I discussed with the patient that most people either abstain from alcohol or drink within safe limits (<=14/week and <=4 drinks/occasion for males, <=7/weeks and <= 3 drinks/occasion for females) and that the risk for alcohol disorders and other health effects rises proportionally with the number of drinks per week and how often a drinker exceeds daily limits.  Discussed cessation/primary prevention of drug use and availability of treatment for abuse.   Diet: Encouraged to adjust caloric intake to maintain  or achieve ideal body weight, to reduce intake of dietary saturated fat and total fat, to limit sodium intake by avoiding high sodium foods and not adding table salt, and to maintain adequate dietary potassium and calcium preferably from fresh fruits, vegetables, and low-fat dairy products.    Stressed the importance of regular exercise  Injury prevention: Discussed safety belts, safety helmets, smoke detector, smoking near bedding or upholstery.   Dental health: Discussed importance of regular tooth brushing, flossing, and dental visits.    NEXT PREVENTATIVE PHYSICAL DUE IN 1 YEAR. Return in about 6 months (around 07/27/2022) for HTN AND HLD.

## 2022-01-27 NOTE — Assessment & Plan Note (Signed)
Chronic, stable with BP at goal in office and at home. Will continue current medication regimen and adjust as needed.  Discussed with her that if lower readings noted at home, may consider discontinuation of Amlodipine.  CMP, CBC, urine ALB, and TSH today.  Continue to monitor BP at home regularly and focus on DASH diet.  Plan on follow-up in 6 months.

## 2022-01-27 NOTE — Assessment & Plan Note (Signed)
Chronic, ongoing with some improvement recent scan.  Return in 6 months.  Vit D level today.  Next scan May 2027. 

## 2022-01-28 LAB — COMPREHENSIVE METABOLIC PANEL
ALT: 44 IU/L — ABNORMAL HIGH (ref 0–32)
AST: 35 IU/L (ref 0–40)
Albumin/Globulin Ratio: 1.7 (ref 1.2–2.2)
Albumin: 4.5 g/dL (ref 3.8–4.8)
Alkaline Phosphatase: 69 IU/L (ref 44–121)
BUN/Creatinine Ratio: 20 (ref 12–28)
BUN: 14 mg/dL (ref 8–27)
Bilirubin Total: 0.3 mg/dL (ref 0.0–1.2)
CO2: 25 mmol/L (ref 20–29)
Calcium: 9.3 mg/dL (ref 8.7–10.3)
Chloride: 101 mmol/L (ref 96–106)
Creatinine, Ser: 0.71 mg/dL (ref 0.57–1.00)
Globulin, Total: 2.6 g/dL (ref 1.5–4.5)
Glucose: 94 mg/dL (ref 70–99)
Potassium: 4.5 mmol/L (ref 3.5–5.2)
Sodium: 138 mmol/L (ref 134–144)
Total Protein: 7.1 g/dL (ref 6.0–8.5)
eGFR: 93 mL/min/{1.73_m2} (ref >59)

## 2022-01-28 LAB — CBC WITH DIFFERENTIAL/PLATELET
Basophils Absolute: 0 10*3/uL (ref 0.0–0.2)
Basos: 0 %
EOS (ABSOLUTE): 0.1 10*3/uL (ref 0.0–0.4)
Eos: 2 %
Hematocrit: 38.6 % (ref 34.0–46.6)
Hemoglobin: 13.4 g/dL (ref 11.1–15.9)
Immature Grans (Abs): 0 10*3/uL (ref 0.0–0.1)
Immature Granulocytes: 0 %
Lymphocytes Absolute: 2.2 10*3/uL (ref 0.7–3.1)
Lymphs: 36 %
MCH: 31.1 pg (ref 26.6–33.0)
MCHC: 34.7 g/dL (ref 31.5–35.7)
MCV: 90 fL (ref 79–97)
Monocytes Absolute: 0.6 10*3/uL (ref 0.1–0.9)
Monocytes: 9 %
Neutrophils Absolute: 3 10*3/uL (ref 1.4–7.0)
Neutrophils: 53 %
Platelets: 318 10*3/uL (ref 150–450)
RBC: 4.31 x10E6/uL (ref 3.77–5.28)
RDW: 12.4 % (ref 11.7–15.4)
WBC: 5.9 10*3/uL (ref 3.4–10.8)

## 2022-01-28 LAB — VITAMIN D 25 HYDROXY (VIT D DEFICIENCY, FRACTURES): Vit D, 25-Hydroxy: 77.5 ng/mL (ref 30.0–100.0)

## 2022-01-28 LAB — TSH: TSH: 1.35 u[IU]/mL (ref 0.450–4.500)

## 2022-01-28 LAB — LIPID PANEL W/O CHOL/HDL RATIO
Cholesterol, Total: 134 mg/dL (ref 100–199)
HDL: 45 mg/dL (ref >39)
LDL Chol Calc (NIH): 75 mg/dL (ref 0–99)
Triglycerides: 71 mg/dL (ref 0–149)
VLDL Cholesterol Cal: 14 mg/dL (ref 5–40)

## 2022-01-28 NOTE — Progress Notes (Signed)
Contacted via MyChart The 10-year ASCVD risk score (Arnett DK, et al., 2019) is: 7.9%   Values used to calculate the score:     Age: 66 years     Sex: Female     Is Non-Hispanic African American: No     Diabetic: No     Tobacco smoker: No     Systolic Blood Pressure: 837 mmHg     Is BP treated: Yes     HDL Cholesterol: 45 mg/dL     Total Cholesterol: 134 mg/dL   Good afternoon Mary Wood, your labs have returned.  Overall they look fantastic!!  Even your cholesterol labs, LDL is 75 -- had been 119.  Amazing!!  Kidney function, creatinine and eGFR, remains normal.  Liver function shows very mild elevation in ALT, we will continue to monitor this.  Cut back on any alcohol or Tylenol use.  Any questions? Keep being stellar!!  Thank you for allowing me to participate in your care.  I appreciate you. Kindest regards, Llesenia Fogal

## 2022-03-05 DIAGNOSIS — M1711 Unilateral primary osteoarthritis, right knee: Secondary | ICD-10-CM | POA: Diagnosis not present

## 2022-03-05 DIAGNOSIS — M25562 Pain in left knee: Secondary | ICD-10-CM | POA: Diagnosis not present

## 2022-03-06 ENCOUNTER — Other Ambulatory Visit (HOSPITAL_COMMUNITY): Payer: Self-pay | Admitting: Orthopedic Surgery

## 2022-03-06 ENCOUNTER — Other Ambulatory Visit: Payer: Self-pay | Admitting: Orthopedic Surgery

## 2022-03-06 DIAGNOSIS — M25562 Pain in left knee: Secondary | ICD-10-CM

## 2022-03-11 DIAGNOSIS — M1711 Unilateral primary osteoarthritis, right knee: Secondary | ICD-10-CM | POA: Diagnosis not present

## 2022-03-16 ENCOUNTER — Other Ambulatory Visit: Payer: Self-pay | Admitting: Nurse Practitioner

## 2022-03-16 DIAGNOSIS — Z1231 Encounter for screening mammogram for malignant neoplasm of breast: Secondary | ICD-10-CM

## 2022-03-17 ENCOUNTER — Ambulatory Visit
Admission: RE | Admit: 2022-03-17 | Discharge: 2022-03-17 | Disposition: A | Discharge status: Home / Self Care | Payer: Medicare PPO | Source: Ambulatory Visit | Attending: Orthopedic Surgery | Admitting: Orthopedic Surgery

## 2022-03-17 DIAGNOSIS — M25562 Pain in left knee: Secondary | ICD-10-CM | POA: Diagnosis not present

## 2022-03-18 DIAGNOSIS — M1711 Unilateral primary osteoarthritis, right knee: Secondary | ICD-10-CM | POA: Diagnosis not present

## 2022-03-24 DIAGNOSIS — M2392 Unspecified internal derangement of left knee: Secondary | ICD-10-CM | POA: Diagnosis not present

## 2022-03-25 DIAGNOSIS — M1711 Unilateral primary osteoarthritis, right knee: Secondary | ICD-10-CM | POA: Diagnosis not present

## 2022-03-30 NOTE — Discharge Instructions (Addendum)
Instructions after Knee Arthroscopy    James P. Hooten, Jr., M.D.     Dept. of Orthopaedics & Sports Medicine  [facility]  [address]  [place], [place]  [address]   Phone: [phone]   Fax: [phone]   DIET: Drink plenty of non-alcoholic fluids & begin a light diet. Resume your normal diet the day after surgery.  ACTIVITY:  You may use crutches or a walker with weight-bearing as tolerated, unless instructed otherwise. You may wean yourself off of the walker or crutches as tolerated.  Begin doing gentle exercises. Exercising will reduce the pain and swelling, increase motion, and prevent muscle weakness.   Avoid strenuous activities or athletics for a minimum of 4-6 weeks after arthroscopic surgery. Do not drive or operate any equipment until instructed.  WOUND CARE:  Place one to two pillows under the knee the first day or two when sitting or lying.  Continue to use the ice packs periodically to reduce pain and swelling. The small incisions in your knee are closed with nylon stitches. The stitches will be removed in the office. The bulky dressing may be removed on the second day after surgery. DO NOT TOUCH THE STITCHES. Put a Band-Aid over each stitch. Do NOT use any ointments or creams on the incisions.  You may bathe or shower after the stitches are removed at the first office visit following surgery.  MEDICATIONS: You may resume your regular medications. Please take the pain medication as prescribed. Do not take pain medication on an empty stomach. Do not drive or drink alcoholic beverages when taking pain medications.  CALL THE OFFICE FOR: Temperature above 101 degrees Excessive bleeding or drainage on the dressing. Excessive swelling, coldness, or paleness of the toes. Persistent nausea and vomiting.  FOLLOW-UP:  You should have an appointment to return to the office in 7-10 days after surgery.      [facility] Department Directory          [email or web address]       [email or web address]          Cardiology  Appointments: [place] - [phone] [place] - [phone]  Endocrinology  Appointments: [place] - [phone] [place] - [phone]  Gastroenterology  Appointments: [place] - [phone] [place] - [phone]        General Surgery   Appointments: [place] - [phone]  Internal Medicine/Family Medicine  Appointments: [place] - [phone] [place] - [phone] [place] - [phone]  Metabolic and Weigh Loss Surgery  Appointments: [place] - [phone]        Neurology  Appointments: [place] - [phone] [place] - [phone]  Neurosurgery  Appointments: [place] - [phone]  Obstetrics & Gynecology  Appointments: [place] - [phone] [place] - [phone]        Pediatrics  Appointments: [place] - [phone] [place] - [phone]  Physiatry  Appointments: [place] -[phone]  Physical Therapy  Appointments: [place] - [phone] [place] - [phone]        Podiatry  Appointments: [place] - [phone] [place] - [phone]  Pulmonology  Appointments: [place] - [phone]  Rheumatology  Appointments: [place] - [phone]        [place] Location: [facility]  [address] , [place]  [address]  [place] Location: [facility] [address] [place], [place]  [address]  [place] Location: [facility] [address] [place], [place]  [address]      AMBULATORY SURGERY  DISCHARGE INSTRUCTIONS   The drugs that you were given will stay in your system until tomorrow so for the next 24   hours you should not:  Drive an automobile Make any legal decisions Drink any alcoholic beverage   You may resume regular meals tomorrow.  Today it is better to start with liquids and gradually work up to solid foods.  You may eat anything you prefer, but it is better to  start with liquids, then soup and crackers, and gradually work up to solid foods.   Please notify your doctor immediately if you have any unusual bleeding, trouble breathing, redness and pain at the surgery site, drainage, fever, or pain not relieved by medication.    Additional Instructions:   Please contact your physician with any problems or Same Day Surgery at [phone], Monday through Friday 6 am to 4 pm, or [place] [facility] [place] Main number at [phone]. 

## 2022-04-01 ENCOUNTER — Other Ambulatory Visit: Payer: Self-pay

## 2022-04-01 ENCOUNTER — Encounter
Admission: RE | Admit: 2022-04-01 | Discharge: 2022-04-01 | Disposition: A | Discharge status: Home / Self Care | Payer: Medicare PPO | Source: Ambulatory Visit | Attending: Orthopedic Surgery | Admitting: Orthopedic Surgery

## 2022-04-01 HISTORY — DX: Unspecified osteoarthritis, unspecified site: M19.90

## 2022-04-01 NOTE — Patient Instructions (Addendum)
Your procedure is scheduled on: Wednesday 04/08/22 ?Report to the Registration Desk on the 1st floor of the [facility]. ?To find out your arrival time, please call [phone] between 1PM - 3PM on: Tuesday 04/07/22 ? ?REMEMBER: ?Instructions that are not followed completely may result in serious medical risk, up to and including death; or upon the discretion of your surgeon and anesthesiologist your surgery may need to be rescheduled. ? ?Do not eat food after midnight the night before surgery.  ?No gum chewing, lozengers or hard candies. ? ?You may however, drink CLEAR liquids up to 2 hours before you are scheduled to arrive for your surgery. Do not drink anything within 2 hours of your scheduled arrival time. ? ?Clear liquids include: ?- water  ?- apple juice without pulp ?- gatorade (not RED colors) ?- black coffee or tea (Do NOT add milk or creamers to the coffee or tea) ?Do NOT drink anything that is not on this list. ? ?In addition, your doctor has ordered for you to drink the provided  ?Ensure Pre-Surgery Clear Carbohydrate Drink  ?Drinking this carbohydrate drink up to two hours before surgery helps to reduce insulin resistance and improve patient outcomes. Please complete drinking 2 hours prior to scheduled arrival time. ? ?TAKE THESE MEDICATIONS THE MORNING OF SURGERY WITH A SIP OF WATER: ?amLODipine (NORVASC) 5 MG tablet ? ? ?One week prior to surgery: ?Stop Anti-inflammatories (NSAIDS) such as Advil, Aleve, Ibuprofen, Motrin, Naproxen, Naprosyn and Aspirin based products such as Excedrin, Goodys Powder, BC Powder. ? ?Stop taking your Calcium Carbonate (CALCARB 600 PO), calcium-vitamin D (OSCAL WITH D) 500-5 MG-MCG tablet, cholecalciferol (VITAMIN D3) 25 MCG (1000 UNIT) tablet, Misc Natural Products (OSTEO BI-FLEX/5-LOXIN ADVANCED) TABS, and ANY OVER THE COUNTER supplements until after surgery. ? ?You may however, continue to take Tylenol 650 mg if needed for pain up until the day of surgery. ? ?No  Alcohol for 24 hours before or after surgery. ? ?No Smoking including e-cigarettes for 24 hours prior to surgery.  ?No chewable tobacco products for at least 6 hours prior to surgery.  ?No nicotine patches on the day of surgery. ? ?Do not use any "recreational" drugs for at least a week prior to your surgery.  ?Please be advised that the combination of cocaine and anesthesia may have negative outcomes, up to and including death. ?If you test positive for cocaine, your surgery will be cancelled. ? ?On the morning of surgery brush your teeth with toothpaste and water, you may rinse your mouth with mouthwash if you wish. ?Do not swallow any toothpaste or mouthwash. ? ?Use CHG Soap as directed on instruction sheet. ? ?Do not wear jewelry, make-up, hairpins, clips or nail polish. ? ?Do not wear lotions, powders, or perfumes.  ? ?Do not shave body from the neck down 48 hours prior to surgery just in case you cut yourself which could leave a site for infection.  ?Also, freshly shaved skin may become irritated if using the CHG soap. ? ?Contact lenses, hearing aids and dentures may not be worn into surgery. ? ?Do not bring valuables to the hospital. [facility] is not responsible for any missing/lost belongings or valuables.  ? ?Notify your doctor if there is any change in your medical condition (cold, fever, infection). ? ?Wear comfortable clothing (specific to your surgery type) to the hospital. ? ?If you are being discharged the day of surgery, you will not be allowed to drive home. ?You will need a responsible adult (18 years  or older) to drive you home and stay with you that night.  ? ?If you are taking public transportation, you will need to have a responsible adult (18 years or older) with you. ?Please confirm with your physician that it is acceptable to use public transportation.  ? ?Please call the [facility] Dept. at [phone] if you have any questions about these instructions. ? ?Surgery  Visitation Policy: ? ?Patients undergoing a surgery or procedure may have two family members or support persons with them as long as the person is not COVID-19 positive or experiencing its symptoms.  ? ?Inpatient Visitation:   ? ?Visiting hours are 7 a.m. to 8 p.m. ?Up to four visitors are allowed at one time in a patient room, including children. The visitors may rotate out with other people during the day. One designated support person (adult) may remain overnight.  ?

## 2022-04-04 NOTE — H&P (Signed)
ORTHOPAEDIC HISTORY & PHYSICAL ?Mary Wood, Adelina Mings., MD - 03/24/2022 3:45 PM EDT ?Formatting of this note is different from the original. ?Images from the original note were not included. ?Chief Complaint: ?Chief Complaint  ?Patient presents with  ? Follow-up  ?Left knee MRI 03/17/22 results  ? ?Reason for Visit: ?The patient is a 66 y.o. female who presents today for reevaluation of her left knee. She has a 6-8 week history of left knee pain. She does not recall any trauma or aggravating event. She describes the pain as a "sharp, stabbing" sensation that has caused some buckling and near falls. She localizes most of the pain along the medial and posterior aspect of the knee. She reports some swelling, no locking, and significant giving way of the knee. The pain is aggravated by going up and down stairs, lateral movements and pivoting. The symptoms were so severe that she was unable to independently get off of a motorcycle and had to be "lifted" off. The patient has not appreciated any significant improvement despite Tylenol, NSAIDs, and activity modification. She is not using any ambulatory aids but admits to "holding on to the furniture or walls" due to fear of falling. ? ?Medications: ?Current Outpatient Medications  ?Medication Sig Dispense Refill  ? acetaminophen (TYLENOL) 650 MG ER tablet Take 650 mg by mouth every 8 (eight) hours as needed  ? amLODIPine (NORVASC) 5 MG tablet 5 mg once daily  ? calcium carbonate-vitamin D3 (OS-CAL 500+D) 500 mg(1,250mg ) -200 unit tablet Take 1 tablet by mouth once daily  ? cholecalciferol (VITAMIN D3) 1000 unit tablet Take 1,000 Units by mouth once daily  ? docusate (COLACE) 100 MG capsule Take 200 mg by mouth 2 (two) times daily as needed  ? glucosamine/chondr su A sod (OSTEO BI-FLEX ORAL) Take by mouth 2 tablets a day  ? ibuprofen (MOTRIN) 800 MG tablet Take 800 mg by mouth 2 (two) times daily as needed  ? losartan (COZAAR) 100 MG tablet 100 mg once daily  ? naproxen  (NAPROSYN) 500 MG tablet Take 500 mg by mouth 2 (two) times daily as needed  ? ?No current facility-administered medications for this visit.  ? ?Allergies: ?Allergies  ?Allergen Reactions  ? Allergenic Extracts Other (See Comments)  ?Stuffy nose itchy eyes  ? Cat Dander Itching  ?Itching of eyes  ? Cat/Feline Products Other (See Comments)  ?Stuffy nose itchy eyes  ? ?Past Medical History: ?Past Medical History:  ?Diagnosis Date  ? Chicken pox  ? Hypertension  ? Shingles  ? ?Past Surgical History: ?Past Surgical History:  ?Procedure Laterality Date  ? LEFT ELBOW SURGERY Left 06/25/2011  ? Right knee arthroscopy, partial medial meniscectomy, and chondroplasty 05/07/2021  ?Dr Ernest Pine  ? TONSILLECTOMY  ? WISDOM TEETH  ? ?Social History: ?Social History  ? ?Socioeconomic History  ? Marital status: Married  ?Spouse name: Tammy Sours  ? Number of children: 2  ? Years of education: 44  ? Highest education level: Bachelor's degree (e.g., BA, AB, BS)  ?Occupational History  ? 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 Smoking status: Never  ? Smokeless tobacco: Never  ?Vaping Use  ? Vaping Use: Never used  ?Substance and Sexual Activity  ? Alcohol use: Yes  ?Comment: occasionally  ? Drug use: Never  ? Sexual activity: Yes  ?Partners: Male  ? ?Family History: ?Family History  ?Problem Relation Age of Onset  ? High blood pressure (Hypertension) Mother  ? Stroke Mother  ? High blood pressure (Hypertension) Father  ?  Stroke Father  ? High blood pressure (Hypertension) Sister  ? High blood pressure (Hypertension) Brother  ? Colon cancer Brother  ? ?Review of Systems: ?A comprehensive 14 point ROS was performed, reviewed, and the pertinent orthopaedic findings are documented in the HPI. ? ?Exam ?BP (!) 132/90  Ht 170.2 cm (5\' 7" )  Wt 92.4 kg (203 lb 12.8 oz)  BMI 31.92 kg/m?  ? ?General:  ?Well-developed, well-nourished female seen in no acute distress.  ?Antalgic gait.  ?No varus or valgus thrust to the left knee. ? ?HEENT:  ?Atraumatic,  normocephalic. Pupils are equal and reactive to light. Extraocular motion is intact. Sclera are clear. Oropharynx is clear with moist mucosa. ? ?Lungs:  ?Clear to auscultation bilaterally. ? ?Cardiovascular: ?Regular rate and rhythm. Normal S1, S2. No murmur . No appreciable gallops or rubs. Peripheral pulses are palpable. No lower extremity edema. Homan`s test is negative.  ? ?Extremities: ?Good strength, stability, and range of motion of the upper extremities. ?Good range of motion of the hips and ankles. ?  ?Left Knee:  ?Soft tissue swelling: mild ?Effusion: minimal ?Erythema: none ?Crepitance: severe ?Tenderness: medial, posterior ?Alignment: normal ?Mediolateral laxity: stable ?Anterior drawer test:negative ?Lachman`s test: negative ?McMurray`s test: positive ?Atrophy: No significant atrophy.  ?Quadriceps tone was fair to good. ?Range of Motion: greater than 120 degrees ?  ?Neurologic:  ?Awake, alert, and oriented.  ?Sensory function is intact to pinprick and light touch.  ?Motor strength is judged to be 5/5.  ?Motor coordination is within normal limits.  ?No apparent clonus. No tremor.  ? ?MRI: ?I reviewed the left knee MRI from [facility] dated 03/17/2022. I concur with the radiologist's interpretation as below: ? ?MRI OF THE LEFT KNEE WITHOUT CONTRAST  ? ?TECHNIQUE:  ?Multiplanar, multisequence MR imaging of the knee was performed. No  ?intravenous contrast was administered.  ? ?COMPARISON:  None.  ? ?FINDINGS:  ?MENISCI  ? ?Medial meniscus: Complex degenerative tearing is seen in a markedly  ?diminutive posterior horn. There is fraying along the free edge of  ?the body.  ? ?Lateral meniscus:  Intact.  ? ?LIGAMENTS  ? ?Cruciates:  Intact.  ? ?Collaterals:  Intact.  ? ?CARTILAGE  ? ?Patellofemoral: Thinning without focal defect is most notable at the  ?patellar apex and along the medial facet in the upper pole.  ? ?Medial:  Degenerated and thinned without focal defect.  ? ?Lateral:   Minimally degenerated.  ? ?Joint:  Small joint effusion.  ? ?Popliteal Fossa: No Baker's cyst. There is mild edema at the  ?musculotendinous junction of the popliteus compatible with strain.  ?No tear.  ? ?Extensor Mechanism:  Intact.  ? ?Bones: No fracture or worrisome lesion. Small foci of mild  ?subchondral edema are seen about the medial compartment. There are  ?small osteophytes about the knee.  ? ?Other: None.  ? ?IMPRESSION:  ?Mild edema at the musculotendinous junction of the popliteus is  ?compatible with strain. The popliteus tendon is intact.  ? ?Complex degenerative tearing posterior horn medial meniscus.  ? 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  ? ?Electronically Signed  ?  By: 05/17/2022 M.D.  ?  On: 03/18/2022 08:59 ? ?Impression: ?Internal derangement of the left knee ? ?Plan:  ?The findings were discussed in detail with the patient. ?The patient was given informational material on knee arthroscopy. ?Conservative treatment options were reviewed with the patient. We discussed the risks and benefits of surgical intervention. The usual perioperative course was  also discussed in detail. The patient expressed understanding of the risks and benefits of surgical intervention and would like to proceed with plans for left knee arthroscopy. ? ?I spent a total of 40 minutes in both face-to-face and non-face-to-face activities, excluding procedures performed, for this visit on the date of this encounter. ? ?MEDICAL CLEARANCE: ?Per anesthesiology. ?ACTIVITIES:  ?Avoid pivoting, squatting, or twisting. ?WORK STATUS: ?Not applicable. ?THERAPY: ?Quadriceps strengthening exercises. ?MEDICATIONS: ?Requested Prescriptions  ? ?No prescriptions requested or ordered in this encounter  ? ?FOLLOW-UP: ?Return for postoperative follow-up.  ? ?Deedee Lybarger P. Moses Odoherty, Jr., MAngie Fava.D. ? ?This note was generated in part with voice recognition software and I apologize for any typographical errors  that were not detected and corrected.  ?Electronically signed by Shari HeritageHooten, Chaelyn Bunyan Philmon Jr., MD at 03/29/2022 6:42 AM EDT ? ?

## 2022-04-06 ENCOUNTER — Encounter: Payer: Self-pay | Admitting: Urgent Care

## 2022-04-06 ENCOUNTER — Other Ambulatory Visit
Admission: RE | Admit: 2022-04-06 | Discharge: 2022-04-06 | Disposition: A | Discharge status: Home / Self Care | Payer: Medicare PPO | Source: Ambulatory Visit | Attending: Orthopedic Surgery | Admitting: Orthopedic Surgery

## 2022-04-06 DIAGNOSIS — Z01810 Encounter for preprocedural cardiovascular examination: Secondary | ICD-10-CM | POA: Insufficient documentation

## 2022-04-07 DIAGNOSIS — H2513 Age-related nuclear cataract, bilateral: Secondary | ICD-10-CM | POA: Diagnosis not present

## 2022-04-07 DIAGNOSIS — H43811 Vitreous degeneration, right eye: Secondary | ICD-10-CM | POA: Diagnosis not present

## 2022-04-07 MED ORDER — FAMOTIDINE 20 MG PO TABS: 20.0000 mg | ORAL_TABLET | Freq: Once | ORAL | Status: AC

## 2022-04-07 MED ORDER — CHLORHEXIDINE GLUCONATE 0.12 % MT SOLN: 15.0000 mL | Freq: Once | OROMUCOSAL | Status: AC

## 2022-04-07 MED ORDER — CELECOXIB 200 MG PO CAPS: 400.0000 mg | ORAL_CAPSULE | Freq: Once | ORAL | Status: AC

## 2022-04-07 MED ORDER — ORAL CARE MOUTH RINSE
15.0000 mL | Freq: Once | OROMUCOSAL | Status: AC
Start: 2022-04-07 — End: 2022-04-08

## 2022-04-07 MED ORDER — LACTATED RINGERS IV SOLN: INTRAVENOUS | Status: DC

## 2022-04-08 ENCOUNTER — Other Ambulatory Visit: Payer: Self-pay

## 2022-04-08 ENCOUNTER — Ambulatory Visit
Admission: RE | Admit: 2022-04-08 | Discharge: 2022-04-08 | Disposition: A | Discharge status: Home / Self Care | Payer: Medicare PPO | Attending: Orthopedic Surgery | Admitting: Orthopedic Surgery

## 2022-04-08 ENCOUNTER — Ambulatory Visit: Payer: Medicare PPO | Admitting: Urgent Care

## 2022-04-08 ENCOUNTER — Encounter
Admission: RE | Disposition: A | Discharge status: Home / Self Care | Payer: Self-pay | Source: Home / Self Care | Attending: Orthopedic Surgery

## 2022-04-08 ENCOUNTER — Encounter: Payer: Self-pay | Admitting: Orthopedic Surgery

## 2022-04-08 DIAGNOSIS — Z791 Long term (current) use of non-steroidal anti-inflammatories (NSAID): Secondary | ICD-10-CM | POA: Diagnosis not present

## 2022-04-08 DIAGNOSIS — Z6830 Body mass index (BMI) 30.0-30.9, adult: Secondary | ICD-10-CM | POA: Diagnosis not present

## 2022-04-08 DIAGNOSIS — M94262 Chondromalacia, left knee: Secondary | ICD-10-CM | POA: Diagnosis not present

## 2022-04-08 DIAGNOSIS — M1712 Unilateral primary osteoarthritis, left knee: Secondary | ICD-10-CM | POA: Insufficient documentation

## 2022-04-08 DIAGNOSIS — Z9889 Other specified postprocedural states: Secondary | ICD-10-CM

## 2022-04-08 DIAGNOSIS — E669 Obesity, unspecified: Secondary | ICD-10-CM | POA: Insufficient documentation

## 2022-04-08 DIAGNOSIS — M2392 Unspecified internal derangement of left knee: Secondary | ICD-10-CM | POA: Diagnosis not present

## 2022-04-08 DIAGNOSIS — M23222 Derangement of posterior horn of medial meniscus due to old tear or injury, left knee: Secondary | ICD-10-CM | POA: Insufficient documentation

## 2022-04-08 DIAGNOSIS — I10 Essential (primary) hypertension: Secondary | ICD-10-CM | POA: Insufficient documentation

## 2022-04-08 HISTORY — PX: KNEE ARTHROSCOPY: SHX127

## 2022-04-08 SURGERY — ARTHROSCOPY, KNEE
Anesthesia: General | Site: Knee | Laterality: Left

## 2022-04-08 MED ORDER — HYDROCODONE-ACETAMINOPHEN 7.5-325 MG PO TABS: 1.0000 | ORAL_TABLET | ORAL | Status: DC | PRN

## 2022-04-08 MED ORDER — CELECOXIB 200 MG PO CAPS
ORAL_CAPSULE | ORAL | Status: AC
  Administered 2022-04-08: 400 mg via ORAL
  Filled 2022-04-08: qty 2

## 2022-04-08 MED ORDER — PHENYLEPHRINE HCL (PRESSORS) 10 MG/ML IV SOLN
INTRAVENOUS | Status: DC | PRN
  Administered 2022-04-08 (×2): 80 ug via INTRAVENOUS

## 2022-04-08 MED ORDER — SEVOFLURANE IN SOLN
RESPIRATORY_TRACT | Status: AC
  Filled 2022-04-08: qty 250

## 2022-04-08 MED ORDER — OXYCODONE HCL 5 MG PO TABS
5.0000 mg | ORAL_TABLET | Freq: Once | ORAL | Status: AC | PRN
  Administered 2022-04-08: 5 mg via ORAL

## 2022-04-08 MED ORDER — METOCLOPRAMIDE HCL 5 MG/ML IJ SOLN: 5.0000 mg | Freq: Three times a day (TID) | INTRAMUSCULAR | Status: DC | PRN

## 2022-04-08 MED ORDER — FENTANYL CITRATE (PF) 100 MCG/2ML IJ SOLN
INTRAMUSCULAR | Status: AC
Start: 2022-04-08 — End: 2022-04-08
  Administered 2022-04-08: 50 ug via INTRAVENOUS
  Filled 2022-04-08: qty 2

## 2022-04-08 MED ORDER — HYDROCODONE-ACETAMINOPHEN 5-325 MG PO TABS: 1.0000 | ORAL_TABLET | ORAL | 0 refills | Status: DC | PRN

## 2022-04-08 MED ORDER — MIDAZOLAM HCL 2 MG/2ML IJ SOLN
INTRAMUSCULAR | Status: DC | PRN
Start: 2022-04-08 — End: 2022-04-08
  Administered 2022-04-08: 2 mg via INTRAVENOUS

## 2022-04-08 MED ORDER — MIDAZOLAM HCL 2 MG/2ML IJ SOLN
INTRAMUSCULAR | Status: AC
  Filled 2022-04-08: qty 2

## 2022-04-08 MED ORDER — DEXAMETHASONE SODIUM PHOSPHATE 10 MG/ML IJ SOLN
INTRAMUSCULAR | Status: DC | PRN
Start: 2022-04-08 — End: 2022-04-08
  Administered 2022-04-08: 10 mg via INTRAVENOUS

## 2022-04-08 MED ORDER — ACETAMINOPHEN 325 MG PO TABS: 325.0000 mg | ORAL_TABLET | Freq: Four times a day (QID) | ORAL | Status: DC | PRN

## 2022-04-08 MED ORDER — FENTANYL CITRATE (PF) 100 MCG/2ML IJ SOLN
25.0000 ug | INTRAMUSCULAR | Status: DC | PRN
  Administered 2022-04-08: 50 ug via INTRAVENOUS

## 2022-04-08 MED ORDER — FENTANYL CITRATE (PF) 100 MCG/2ML IJ SOLN
INTRAMUSCULAR | Status: AC
  Filled 2022-04-08: qty 2

## 2022-04-08 MED ORDER — FENTANYL CITRATE (PF) 100 MCG/2ML IJ SOLN
INTRAMUSCULAR | Status: DC | PRN
  Administered 2022-04-08 (×2): 50 ug via INTRAVENOUS
  Administered 2022-04-08: 100 ug via INTRAVENOUS

## 2022-04-08 MED ORDER — HYDROCODONE-ACETAMINOPHEN 5-325 MG PO TABS: 1.0000 | ORAL_TABLET | ORAL | Status: DC | PRN

## 2022-04-08 MED ORDER — ONDANSETRON HCL 4 MG/2ML IJ SOLN
INTRAMUSCULAR | Status: DC | PRN
  Administered 2022-04-08: 4 mg via INTRAVENOUS

## 2022-04-08 MED ORDER — ONDANSETRON HCL 4 MG PO TABS: 4.0000 mg | ORAL_TABLET | Freq: Four times a day (QID) | ORAL | Status: DC | PRN

## 2022-04-08 MED ORDER — MORPHINE SULFATE (PF) 2 MG/ML IV SOLN: 0.5000 mg | INTRAVENOUS | Status: DC | PRN

## 2022-04-08 MED ORDER — ACETAMINOPHEN 10 MG/ML IV SOLN
INTRAVENOUS | Status: DC | PRN
  Administered 2022-04-08: 1000 mg via INTRAVENOUS

## 2022-04-08 MED ORDER — FAMOTIDINE 20 MG PO TABS
ORAL_TABLET | ORAL | Status: AC
  Administered 2022-04-08: 20 mg via ORAL
  Filled 2022-04-08: qty 1

## 2022-04-08 MED ORDER — ONDANSETRON 4 MG PO TBDP
ORAL_TABLET | ORAL | Status: AC
  Filled 2022-04-08: qty 1

## 2022-04-08 MED ORDER — ONDANSETRON HCL 4 MG/2ML IJ SOLN: 4.0000 mg | Freq: Once | INTRAMUSCULAR | Status: DC | PRN

## 2022-04-08 MED ORDER — LACTATED RINGERS IV SOLN: INTRAVENOUS | Status: DC

## 2022-04-08 MED ORDER — MORPHINE SULFATE 4 MG/ML IJ SOLN
INTRAMUSCULAR | Status: DC | PRN
  Administered 2022-04-08: 5 mL
  Administered 2022-04-08: 26 mL

## 2022-04-08 MED ORDER — PROPOFOL 10 MG/ML IV BOLUS
INTRAVENOUS | Status: DC | PRN
  Administered 2022-04-08: 150 mg via INTRAVENOUS
  Administered 2022-04-08: 50 mg via INTRAVENOUS

## 2022-04-08 MED ORDER — EPHEDRINE SULFATE (PRESSORS) 50 MG/ML IJ SOLN
INTRAMUSCULAR | Status: DC | PRN
  Administered 2022-04-08: 10 mg via INTRAVENOUS

## 2022-04-08 MED ORDER — ONDANSETRON HCL 4 MG/2ML IJ SOLN: 4.0000 mg | Freq: Four times a day (QID) | INTRAMUSCULAR | Status: DC | PRN

## 2022-04-08 MED ORDER — MORPHINE SULFATE (PF) 4 MG/ML IV SOLN
INTRAVENOUS | Status: AC
  Filled 2022-04-08: qty 1

## 2022-04-08 MED ORDER — METOCLOPRAMIDE HCL 10 MG PO TABS: 5.0000 mg | ORAL_TABLET | Freq: Three times a day (TID) | ORAL | Status: DC | PRN

## 2022-04-08 MED ORDER — LACTATED RINGERS IR SOLN
Status: DC | PRN
  Administered 2022-04-08 (×2): 6000 mL

## 2022-04-08 MED ORDER — LIDOCAINE HCL (CARDIAC) PF 100 MG/5ML IV SOSY
PREFILLED_SYRINGE | INTRAVENOUS | Status: DC | PRN
Start: 2022-04-08 — End: 2022-04-08
  Administered 2022-04-08: 100 mg via INTRAVENOUS

## 2022-04-08 MED ORDER — OXYCODONE HCL 5 MG PO TABS
ORAL_TABLET | ORAL | Status: AC
  Filled 2022-04-08: qty 1

## 2022-04-08 MED ORDER — BUPIVACAINE-EPINEPHRINE (PF) 0.25% -1:200000 IJ SOLN
INTRAMUSCULAR | Status: AC
  Filled 2022-04-08: qty 30

## 2022-04-08 MED ORDER — CHLORHEXIDINE GLUCONATE 0.12 % MT SOLN
OROMUCOSAL | Status: AC
  Administered 2022-04-08: 15 mL via OROMUCOSAL
  Filled 2022-04-08: qty 15

## 2022-04-08 MED ORDER — OXYCODONE HCL 5 MG/5ML PO SOLN: 5.0000 mg | Freq: Once | ORAL | Status: AC | PRN

## 2022-04-08 MED ORDER — ACETAMINOPHEN 10 MG/ML IV SOLN: 1000.0000 mg | Freq: Once | INTRAVENOUS | Status: DC | PRN

## 2022-04-08 MED ORDER — SODIUM CHLORIDE 0.9 % IV SOLN: INTRAVENOUS | Status: DC

## 2022-04-08 SURGICAL SUPPLY — 34 items
ADAPTER IRRIG TUBE 2 SPIKE SOL (ADAPTER) ×6 IMPLANT
ADPR TBG 2 SPK PMP STRL ASCP (ADAPTER) ×2
BLADE SHAVER 4.5 DBL SERAT CV (CUTTER) ×1 IMPLANT
BNDG CMPR STD VLCR NS LF 5.8X6 (GAUZE/BANDAGES/DRESSINGS) ×1
BNDG ELASTIC 6X5.8 VLCR NS LF (GAUZE/BANDAGES/DRESSINGS) ×3 IMPLANT
DRAPE ARTHRO LIMB 89X125 STRL (DRAPES) ×3 IMPLANT
DRSG DERMACEA 8X12 NADH (GAUZE/BANDAGES/DRESSINGS) ×3 IMPLANT
DURAPREP 26ML APPLICATOR (WOUND CARE) ×3 IMPLANT
GAUZE SPONGE 4X4 12PLY STRL (GAUZE/BANDAGES/DRESSINGS) ×3 IMPLANT
GLOVE SURG ENC TEXT LTX SZ7.5 (GLOVE) ×3 IMPLANT
GLOVE SURG UNDER LTX SZ8 (GLOVE) ×3 IMPLANT
GOWN STRL REUS W/ TWL LRG LVL3 (GOWN DISPOSABLE) ×4 IMPLANT
GOWN STRL REUS W/TWL LRG LVL3 (GOWN DISPOSABLE) ×4
IV LACTATED RINGER IRRG 3000ML (IV SOLUTION) ×8
IV LR IRRIG 3000ML ARTHROMATIC (IV SOLUTION) ×4 IMPLANT
KIT TURNOVER KIT A (KITS) ×3 IMPLANT
MANIFOLD NEPTUNE II (INSTRUMENTS) ×3 IMPLANT
PACK ARTHROSCOPY KNEE (MISCELLANEOUS) ×3 IMPLANT
PAD CAST CTTN 4X4 STRL (SOFTGOODS) IMPLANT
PADDING CAST 6X4YD NS (MISCELLANEOUS) ×1
PADDING CAST COTTON 4X4 STRL (SOFTGOODS) ×2
PADDING CAST COTTON 6X4 NS (MISCELLANEOUS) ×2 IMPLANT
SHAVER BLADE TAPERED BLUNT 4 (BLADE) ×3 IMPLANT
SOL PREP PVP 2OZ (MISCELLANEOUS) ×2
SOLUTION PREP PVP 2OZ (MISCELLANEOUS) ×2 IMPLANT
SPONGE T-LAP 18X18 ~~LOC~~+RFID (SPONGE) ×3 IMPLANT
STOCKINETTE BIAS CUT 6 980064 (GAUZE/BANDAGES/DRESSINGS) ×1 IMPLANT
SUT ETHILON 3-0 FS-10 30 BLK (SUTURE) ×2
SUTURE EHLN 3-0 FS-10 30 BLK (SUTURE) ×2 IMPLANT
TUBING INFLOW SET DBFLO PUMP (TUBING) ×3 IMPLANT
TUBING OUTFLOW SET DBLFO PUMP (TUBING) ×3 IMPLANT
WAND HAND CNTRL MULTIVAC 50 (MISCELLANEOUS) ×3 IMPLANT
WATER STERILE IRR 500ML POUR (IV SOLUTION) ×3 IMPLANT
WRAP KNEE W/COLD PACKS 25.5X14 (SOFTGOODS) ×3 IMPLANT

## 2022-04-08 NOTE — Op Note (Signed)
OPERATIVE NOTE ? ?DATE OF SURGERY:  04/08/2022 ? ?PATIENT NAME:  Mary Wood   ?DOB: 1956/02/14  ?MRN: [id number] ? ? ?PRE-OPERATIVE DIAGNOSIS:  Internal derangement of the left knee  ? 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 ?PROCEDURE:  Left knee arthroscopy, partial medial meniscectomy, and chondroplasty ? ?SURGEON:  Marciano Sequin., M.D.  ? ?ASSISTANT: none ? ?ANESTHESIA: general ? ?ESTIMATED BLOOD LOSS: Minimal ? ?FLUIDS REPLACED: 1000 mL of crystalloid ? ?TOURNIQUET TIME: Not used ? ?INDICATIONS FOR SURGERY: Mary Wood is a 66 y.o. year old female who has been seen for complaints of left knee pain. MRI demonstrated findings consistent with meniscal pathology. After discussion of the risks and benefits of surgical intervention, the patient expressed understanding of the risks benefits and agree with plans for left knee arthroscopy.  ? ?PROCEDURE IN DETAIL: The patient was brought into the operating room and, after adequate general anesthesia was achieved, a tourniquet was applied to the left thigh and the leg was placed in the leg holder. All bony prominences were well padded. The patient's left knee was cleaned and prepped with alcohol and Duraprep and draped in the usual sterile fashion. A "timeout" was performed as per usual protocol. The anticipated portal sites were injected with 0.25% Marcaine with epinephrine. An anterolateral incision was made and a cannula was inserted. A moderate effusion was evacuated and the knee was distended with fluid using the pump. The scope was advanced down the medial gutter into the medial compartment. Under visualization with the scope, an anteromedial portal was created and a hooked probe was inserted. The medial meniscus was visualized and probed.  There was a complex tear of the posterior horn of the medial meniscus.  A relatively large flap  was associated with a complex tear.  The tear was debrided using meniscal punches and incisor shaver.  Final contouring was performed using the 50 degree ArthroCare wand.  The remaining portion of the medial meniscus was visualized and probed and felt to be stable.  The articular cartilage was visualized.  There were grade 2-3 changes of chondromalacia involving the medial femoral condyle and a localized area of the medial tibial plateau.  These areas were debrided and contoured using the ArthroCare wand.   ? ?The scope was then advanced into the intercondylar notch. The anterior cruciate ligament was visualized and probed and felt to be intact. The scope was removed from the lateral portal and reinserted via the anteromedial portal to better visualize the lateral compartment. The lateral meniscus was visualized and probed.  The lateral meniscus was intact with only mild fraying along the inner portion.  The articular cartilage of the lateral compartment was visualized and noted to be in good condition.  Finally, the scope was advanced so as to visualize the patellofemoral articulation. Good patellar tracking was appreciated.  The articular surface was in good condition. ? ?The knee was irrigated with copius amounts of fluid and suctioned dry. The anterolateral portal was re-approximated with #3-0 nylon. A combination of 0.25% Marcaine with epinephrine and 4 mg of Morphine were injected via the scope. The scope was removed and the anteromedial portal was re-approximated with #3-0 nylon. A sterile dressing was applied followed by application of an ice wrap. ? ?The patient tolerated the procedure well and was transported to the PACU in stable condition. ? ?Elexius Minar P. Holley Bouche., M.D. ? ?

## 2022-04-08 NOTE — H&P (Signed)
The patient has been re-examined, and the chart reviewed, and there have been no interval changes to the documented history and physical.    The risks, benefits, and alternatives have been discussed at length. The patient expressed understanding of the risks benefits and agreed with plans for surgical intervention.  Elane Peabody P. Marcos Ruelas, Jr. M.D.    

## 2022-04-08 NOTE — Anesthesia Preprocedure Evaluation (Addendum)
Anesthesia Evaluation  ?Patient identified by MRN, date of birth, ID band ?Patient awake ? ? ? ?Reviewed: ?Allergy & Precautions, NPO status , Patient's Chart, lab work & pertinent test results ? ?History of Anesthesia Complications ?Negative for: history of anesthetic complications ? ?Airway ?Mallampati: I ? ? ?Neck ROM: Full ? ? ? Dental ? ?(+) Missing, Chipped ?  ?Pulmonary ?neg pulmonary ROS,  ?  ?Pulmonary exam normal ?breath sounds clear to auscultation ? ? ? ? ? ? Cardiovascular ?hypertension, Normal cardiovascular exam ?Rhythm:Regular Rate:Normal ? ?ECG 04/06/22:  ?Normal sinus rhythm ?Cannot rule out Anterior infarct , age undetermined ?  ?Neuro/Psych ?Hx alcohol use disorder; last intake 04/05/22 ?  ? GI/Hepatic ?negative GI ROS,   ?Endo/Other  ?Obesity  ? Renal/GU ?negative Renal ROS  ? ?  ?Musculoskeletal ? ?(+) Arthritis ,  ? Abdominal ?  ?Peds ? Hematology ?negative hematology ROS ?(+)   ?Anesthesia Other Findings ? ? Reproductive/Obstetrics ? ?  ? ? ? ? ? ? ? ? ? ? ? ? ? ?  ?  ? ? ? ? ? ? ? ?Anesthesia Physical ?Anesthesia Plan ? ?ASA: 2 ? ?Anesthesia Plan: General  ? ?Post-op Pain Management:   ? ?Induction: Intravenous ? ?PONV Risk Score and Plan: 3 and Ondansetron, Dexamethasone and Treatment may vary due to age or medical condition ? ?Airway Management Planned: LMA ? ?Additional Equipment:  ? ?Intra-op Plan:  ? ?Post-operative Plan: Extubation in OR ? ?Informed Consent: I have reviewed the patients History and Physical, chart, labs and discussed the procedure including the risks, benefits and alternatives for the proposed anesthesia with the patient or authorized representative who has indicated his/her understanding and acceptance.  ? ? ? ?Dental advisory given ? ?Plan Discussed with: CRNA ? ?Anesthesia Plan Comments: (Patient consented for risks of anesthesia including but not limited to:  ?- adverse reactions to medications ?- damage to eyes, teeth, lips or other  oral mucosa ?- nerve damage due to positioning  ?- sore throat or hoarseness ?- damage to heart, brain, nerves, lungs, other parts of body or loss of life ? ?Informed patient about role of CRNA in peri- and intra-operative care.  Patient voiced understanding.)  ? ? ? ? ? ? ?Anesthesia Quick Evaluation ? ?

## 2022-04-08 NOTE — Anesthesia Procedure Notes (Signed)
Procedure Name: LMA Insertion ?Date/Time: 04/08/2022 4:57 PM ?Performed by: Junious Silk, CRNA ?Pre-anesthesia Checklist: Patient identified, Patient being monitored, Timeout performed, Emergency Drugs available and Suction available ?Patient Re-evaluated:Patient Re-evaluated prior to induction ?Oxygen Delivery Method: Circle system utilized ?Preoxygenation: Pre-oxygenation with 100% oxygen ?Induction Type: IV induction ?Ventilation: Mask ventilation without difficulty ?LMA: LMA inserted ?LMA Size: 4.0 ?Tube type: Oral ?Number of attempts: 1 ?Placement Confirmation: positive ETCO2 and breath sounds checked- equal and bilateral ?Tube secured with: Tape ?Dental Injury: Teeth and Oropharynx as per pre-operative assessment  ? ? ? ? ?

## 2022-04-08 NOTE — Transfer of Care (Signed)
Immediate Anesthesia Transfer of Care Note ? ?Patient: Mary Wood ? ?Procedure(s) Performed: ARTHROSCOPY KNEE (Left: Knee) ? ?Patient Location: PACU ? ?Anesthesia Type:General ? ?Level of Consciousness: awake, alert  and oriented ? ?Airway & Oxygen Therapy: Patient Spontanous Breathing and Patient connected to face mask oxygen ? ?Post-op Assessment: Report given to RN and Post -op Vital signs reviewed and stable ? ?Post vital signs: Reviewed and stable ? ?Last Vitals:  ?Vitals Value Taken Time  ?BP    ?Temp    ?Pulse 113 04/08/22 1827  ?Resp    ?SpO2 94 % 04/08/22 1827  ?Vitals shown include unvalidated device data. ? ?Last Pain:  ?Vitals:  ? 04/08/22 1441  ?PainSc: 4   ?   ? ?  ? ?Complications: No notable events documented. ?

## 2022-04-09 ENCOUNTER — Encounter: Payer: Self-pay | Admitting: Orthopedic Surgery

## 2022-04-13 NOTE — Anesthesia Postprocedure Evaluation (Signed)
Anesthesia Post Note ? ?Patient: Mary Wood ? ?Procedure(s) Performed: ARTHROSCOPY KNEE (Left: Knee) ? ?Patient location during evaluation: PACU ?Anesthesia Type: General ?Level of consciousness: awake and alert ?Pain management: pain level controlled ?Vital Signs Assessment: post-procedure vital signs reviewed and stable ?Respiratory status: spontaneous breathing, nonlabored ventilation and respiratory function stable ?Cardiovascular status: blood pressure returned to baseline and stable ?Postop Assessment: no apparent nausea or vomiting ?Anesthetic complications: no ? ? ?No notable events documented. ? ? ?Last Vitals:  ?Vitals:  ? 04/08/22 1915 04/08/22 1924  ?BP: 138/72 138/72  ?Pulse: 94   ?Resp: 17 18  ?Temp:    ?SpO2: 97% 100%  ?  ?Last Pain:  ?Vitals:  ? 04/08/22 1915  ?PainSc: 6   ? ? ?  ?  ?  ?  ?  ?  ? ?Foye Deer ? ? ? ? ?

## 2022-05-05 ENCOUNTER — Ambulatory Visit
Admission: RE | Admit: 2022-05-05 | Discharge: 2022-05-05 | Disposition: A | Discharge status: Home / Self Care | Payer: Medicare PPO | Source: Ambulatory Visit | Attending: Nurse Practitioner | Admitting: Nurse Practitioner

## 2022-05-05 DIAGNOSIS — M1711 Unilateral primary osteoarthritis, right knee: Secondary | ICD-10-CM | POA: Diagnosis not present

## 2022-05-05 DIAGNOSIS — Z1231 Encounter for screening mammogram for malignant neoplasm of breast: Secondary | ICD-10-CM | POA: Diagnosis not present

## 2022-05-06 NOTE — Progress Notes (Signed)
Contacted via MyChart   Normal mammogram, may repeat in one year:)

## 2022-05-15 DIAGNOSIS — M1711 Unilateral primary osteoarthritis, right knee: Secondary | ICD-10-CM | POA: Diagnosis not present

## 2022-05-15 DIAGNOSIS — Z9889 Other specified postprocedural states: Secondary | ICD-10-CM | POA: Diagnosis not present

## 2022-07-21 ENCOUNTER — Encounter: Payer: Self-pay | Admitting: Nurse Practitioner

## 2022-07-21 MED ORDER — SCOPOLAMINE 1 MG/3DAYS TD PT72
1.0000 | MEDICATED_PATCH | TRANSDERMAL | 12 refills | Status: DC
Start: 2022-07-21 — End: 2022-07-27

## 2022-07-25 NOTE — Patient Instructions (Signed)

## 2022-07-27 ENCOUNTER — Encounter: Payer: Self-pay | Admitting: Nurse Practitioner

## 2022-07-27 ENCOUNTER — Ambulatory Visit: Payer: Medicare PPO | Admitting: Nurse Practitioner

## 2022-07-27 VITALS — BP 124/78 | HR 72 | Temp 97.7°F | Wt 202.2 lb

## 2022-07-27 DIAGNOSIS — I10 Essential (primary) hypertension: Secondary | ICD-10-CM | POA: Diagnosis not present

## 2022-07-27 DIAGNOSIS — Z6831 Body mass index (BMI) 31.0-31.9, adult: Secondary | ICD-10-CM | POA: Diagnosis not present

## 2022-07-27 DIAGNOSIS — E7800 Pure hypercholesterolemia, unspecified: Secondary | ICD-10-CM | POA: Diagnosis not present

## 2022-07-27 DIAGNOSIS — E6609 Other obesity due to excess calories: Secondary | ICD-10-CM | POA: Diagnosis not present

## 2022-07-27 DIAGNOSIS — R7301 Impaired fasting glucose: Secondary | ICD-10-CM | POA: Diagnosis not present

## 2022-07-27 NOTE — Assessment & Plan Note (Addendum)
No current medications.  Recent LDL <100.  Current ASCVD 8.7%.  Continue focus on diet and regular exercise.  Initiate statin as needed.  Educated her on this.

## 2022-07-27 NOTE — Assessment & Plan Note (Signed)
Chronic, stable with BP at goal in office and on home checks. Will continue current medication regimen and adjust as needed.  CMP and CBC today.  Continue to monitor BP at home regularly and focus on DASH diet.  Plan on physical in 6 months.

## 2022-07-27 NOTE — Assessment & Plan Note (Signed)
BMI 31.67. Recommended eating smaller high protein, low fat meals more frequently and exercising 30 mins a day 5 times a week with a goal of 10-15lb weight loss in the next 3 months. Patient voiced their understanding and motivation to adhere to these recommendations.

## 2022-07-27 NOTE — Progress Notes (Signed)
BP 124/78   Pulse 72   Temp 97.7 F (36.5 C) (Oral)   Wt 202 lb 3.2 oz (91.7 kg)   LMP  (LMP Unknown)   SpO2 96%   BMI 31.67 kg/m    Subjective:    Patient ID: Mary Wood, female    DOB: [birth year], 66 y.o.   MRN: [id number]  HPI: Mary Wood is a 66 y.o. female  Chief Complaint  Patient presents with   Hypertension   Hyperlipidemia    6 month follow up    HYPERTENSION / [place] on Amlodipine and Losartan.  Has cut back on Red Bull.  Has upcoming right knee replacement -- Dr. Marry Guan, on 08/31/22. Satisfied with current treatment? yes Duration of hypertension: chronic BP monitoring frequency: a few times a week BP range: <130/80 on average BP medication side effects: no Duration of hyperlipidemia: chronic Aspirin: no Recent stressors: no Recurrent headaches: no Visual changes: no Palpitations: no Dyspnea: no Chest pain: no Lower extremity edema:  occasional Dizzy/lightheaded: no  The 10-year ASCVD risk score (Arnett DK, et al., 2019) is: 8.7%   Values used to calculate the score:     Age: 66 years     Sex: Female     Is Non-Hispanic African American: No     Diabetic: No     Tobacco smoker: No     Systolic Blood Pressure: 517 mmHg     Is BP treated: Yes     HDL Cholesterol: 45 mg/dL     Total Cholesterol: 134 mg/dL  Relevant past medical, surgical, family and social history reviewed and updated as indicated. Interim medical history since our last visit reviewed. Allergies and medications reviewed and updated.  Review of Systems  Constitutional:  Negative for activity change, appetite change, diaphoresis, fatigue and fever.  Respiratory:  Negative for cough, chest tightness and shortness of breath.   Cardiovascular:  Negative for chest pain, palpitations and leg swelling.  Gastrointestinal: Negative.   Neurological: Negative.   Psychiatric/Behavioral: Negative.      Per HPI unless specifically indicated above     Objective:     BP 124/78   Pulse 72   Temp 97.7 F (36.5 C) (Oral)   Wt 202 lb 3.2 oz (91.7 kg)   LMP  (LMP Unknown)   SpO2 96%   BMI 31.67 kg/m   Wt Readings from Last 3 Encounters:  07/27/22 202 lb 3.2 oz (91.7 kg)  04/08/22 195 lb (88.5 kg)  04/01/22 195 lb (88.5 kg)    Physical Exam Vitals and nursing note reviewed.  Constitutional:      General: She is awake. She is not in acute distress.    Appearance: She is well-developed and well-groomed. She is obese. She is not ill-appearing.  HENT:     Head: Normocephalic.     Right Ear: Hearing normal.     Left Ear: Hearing normal.  Eyes:     General: Lids are normal.        Right eye: No discharge.        Left eye: No discharge.     Conjunctiva/sclera: Conjunctivae normal.  Pulmonary:     Effort: Pulmonary effort is normal. No accessory muscle usage or respiratory distress.  Musculoskeletal:     Cervical back: Normal range of motion.  Neurological:     Mental Status: She is alert and oriented to person, place, and time.  Psychiatric:        Attention and Perception: Attention  normal.        Mood and Affect: Mood normal.        Behavior: Behavior normal. Behavior is cooperative.        Thought Content: Thought content normal.        Judgment: Judgment normal.    Results for orders placed or performed in visit on 01/27/22  Microalbumin, Urine Waived  Result Value Ref Range   Microalb, Ur Waived 10 0 - 19 mg/L   Creatinine, Urine Waived 100 10 - 300 mg/dL   Microalb/Creat Ratio <30 <30 mg/g  CBC with Differential/Platelet  Result Value Ref Range   WBC 5.9 3.4 - 10.8 x10E3/uL   RBC 4.31 3.77 - 5.28 x10E6/uL   Hemoglobin 13.4 11.1 - 15.9 g/dL   Hematocrit 38.6 34.0 - 46.6 %   MCV 90 79 - 97 fL   MCH 31.1 26.6 - 33.0 pg   MCHC 34.7 31.5 - 35.7 g/dL   RDW 12.4 11.7 - 15.4 %   Platelets 318 150 - 450 x10E3/uL   Neutrophils 53 Not Estab. %   Lymphs 36 Not Estab. %   Monocytes 9 Not Estab. %   Eos 2 Not Estab. %   Basos 0 Not  Estab. %   Neutrophils Absolute 3.0 1.4 - 7.0 x10E3/uL   Lymphocytes Absolute 2.2 0.7 - 3.1 x10E3/uL   Monocytes Absolute 0.6 0.1 - 0.9 x10E3/uL   EOS (ABSOLUTE) 0.1 0.0 - 0.4 x10E3/uL   Basophils Absolute 0.0 0.0 - 0.2 x10E3/uL   Immature Granulocytes 0 Not Estab. %   Immature Grans (Abs) 0.0 0.0 - 0.1 x10E3/uL  Comprehensive metabolic panel  Result Value Ref Range   Glucose 94 70 - 99 mg/dL   BUN 14 8 - 27 mg/dL   Creatinine, Ser 0.71 0.57 - 1.00 mg/dL   eGFR 93 >59 mL/min/1.73   BUN/Creatinine Ratio 20 12 - 28   Sodium 138 134 - 144 mmol/L   Potassium 4.5 3.5 - 5.2 mmol/L   Chloride 101 96 - 106 mmol/L   CO2 25 20 - 29 mmol/L   Calcium 9.3 8.7 - 10.3 mg/dL   Total Protein 7.1 6.0 - 8.5 g/dL   Albumin 4.5 3.8 - 4.8 g/dL   Globulin, Total 2.6 1.5 - 4.5 g/dL   Albumin/Globulin Ratio 1.7 1.2 - 2.2   Bilirubin Total 0.3 0.0 - 1.2 mg/dL   Alkaline Phosphatase 69 44 - 121 IU/L   AST 35 0 - 40 IU/L   ALT 44 (H) 0 - 32 IU/L  Lipid Panel w/o Chol/HDL Ratio  Result Value Ref Range   Cholesterol, Total 134 100 - 199 mg/dL   Triglycerides 71 0 - 149 mg/dL   HDL 45 >39 mg/dL   VLDL Cholesterol Cal 14 5 - 40 mg/dL   LDL Chol Calc (NIH) 75 0 - 99 mg/dL  TSH  Result Value Ref Range   TSH 1.350 0.450 - 4.500 uIU/mL  VITAMIN D 25 Hydroxy (Vit-D Deficiency, Fractures)  Result Value Ref Range   Vit D, 25-Hydroxy 77.5 30.0 - 100.0 ng/mL      Assessment & Plan:   Problem List Items Addressed This Visit       Cardiovascular and Mediastinum   Hypertension - Primary    Chronic, stable with BP at goal in office and on home checks. Will continue current medication regimen and adjust as needed.  CMP and CBC today.  Continue to monitor BP at home regularly and focus on DASH diet.  Plan on physical in 6 months.      Relevant Orders   Comprehensive metabolic panel   CBC with Differential/Platelet     Other   Elevated LDL cholesterol level    No current medications.  Recent LDL <100.   Current ASCVD 8.7%.  Continue focus on diet and regular exercise.  Initiate statin as needed.  Educated her on this.      Relevant Orders   Lipid Panel w/o Chol/HDL Ratio   Obesity    BMI 31.67. Recommended eating smaller high protein, low fat meals more frequently and exercising 30 mins a day 5 times a week with a goal of 10-15lb weight loss in the next 3 months. Patient voiced their understanding and motivation to adhere to these recommendations.       Other Visit Diagnoses     IFG (impaired fasting glucose)       Occasional elevations past labs, is scheduled for upcoming surgery, check A1c.   Relevant Orders   HgB A1c        Follow up plan: Return in about 6 months (around 01/27/2023) for Annual physical after 01/27/23.

## 2022-07-28 LAB — CBC WITH DIFFERENTIAL/PLATELET
Basophils Absolute: 0 10*3/uL (ref 0.0–0.2)
Basos: 1 %
EOS (ABSOLUTE): 0.2 10*3/uL (ref 0.0–0.4)
Eos: 3 %
Hematocrit: 40.8 % (ref 34.0–46.6)
Hemoglobin: 13.5 g/dL (ref 11.1–15.9)
Immature Grans (Abs): 0 10*3/uL (ref 0.0–0.1)
Immature Granulocytes: 0 %
Lymphocytes Absolute: 2.1 10*3/uL (ref 0.7–3.1)
Lymphs: 36 %
MCH: 30.7 pg (ref 26.6–33.0)
MCHC: 33.1 g/dL (ref 31.5–35.7)
MCV: 93 fL (ref 79–97)
Monocytes Absolute: 0.5 10*3/uL (ref 0.1–0.9)
Monocytes: 8 %
Neutrophils Absolute: 3.1 10*3/uL (ref 1.4–7.0)
Neutrophils: 52 %
Platelets: 290 10*3/uL (ref 150–450)
RBC: 4.4 x10E6/uL (ref 3.77–5.28)
RDW: 12.7 % (ref 11.7–15.4)
WBC: 5.9 10*3/uL (ref 3.4–10.8)

## 2022-07-28 LAB — COMPREHENSIVE METABOLIC PANEL
ALT: 19 IU/L (ref 0–32)
AST: 18 IU/L (ref 0–40)
Albumin/Globulin Ratio: 1.6 (ref 1.2–2.2)
Albumin: 4.4 g/dL (ref 3.9–4.9)
Alkaline Phosphatase: 68 IU/L (ref 44–121)
BUN/Creatinine Ratio: 31 — ABNORMAL HIGH (ref 12–28)
BUN: 21 mg/dL (ref 8–27)
Bilirubin Total: 0.5 mg/dL (ref 0.0–1.2)
CO2: 23 mmol/L (ref 20–29)
Calcium: 9.6 mg/dL (ref 8.7–10.3)
Chloride: 103 mmol/L (ref 96–106)
Creatinine, Ser: 0.68 mg/dL (ref 0.57–1.00)
Globulin, Total: 2.7 g/dL (ref 1.5–4.5)
Glucose: 93 mg/dL (ref 70–99)
Potassium: 4.5 mmol/L (ref 3.5–5.2)
Sodium: 140 mmol/L (ref 134–144)
Total Protein: 7.1 g/dL (ref 6.0–8.5)
eGFR: 95 mL/min/{1.73_m2} (ref >59)

## 2022-07-28 LAB — LIPID PANEL W/O CHOL/HDL RATIO
Cholesterol, Total: 171 mg/dL (ref 100–199)
HDL: 64 mg/dL (ref >39)
LDL Chol Calc (NIH): 99 mg/dL (ref 0–99)
Triglycerides: 39 mg/dL (ref 0–149)
VLDL Cholesterol Cal: 8 mg/dL (ref 5–40)

## 2022-07-28 LAB — HEMOGLOBIN A1C
Est. average glucose Bld gHb Est-mCnc: 105 mg/dL
Hgb A1c MFr Bld: 5.3 % (ref 4.8–5.6)

## 2022-07-28 NOTE — Progress Notes (Signed)
Contacted via MyChart  The 10-year ASCVD risk score (Arnett DK, et al., 2019) is: 8.7%   Values used to calculate the score:     Age: 66 years     Sex: Female     Is Non-Hispanic African American: No     Diabetic: No     Tobacco smoker: No     Systolic Blood Pressure: 124 mmHg     Is BP treated: Yes     HDL Cholesterol: 64 mg/dL     Total Cholesterol: 171 mg/dL  Good morning Mary Wood, your labs have returned: - Kidney function, creatinine and eGFR, remains normal, as is liver function, AST and ALT.   - A1c shows no diabetes or prediabetes!!  Woohoo!! - Cholesterol labs crept up a little, continue diet focus and I will post your ASCVD risk score below -- as we discussed if this creeps up >10% or LDL >190 we will discuss medications. - CBC shows no anemia or infection.  Overall great labs and ready for surgery!!   Keep being amazing!!  Thank you for allowing me to participate in your care.  I appreciate you. Kindest regards, Jolene 

## 2022-08-23 NOTE — Discharge Instructions (Signed)
Instructions after Total Knee Replacement   Corbitt Cloke P. Levii Hairfield, Jr., M.D.     Dept. of Orthopaedics & Sports Medicine  [facility]  [address]  [place], [place]  [address]  Phone: [phone]   Fax: [phone]    DIET: Drink plenty of non-alcoholic fluids. Resume your normal diet. Include foods high in fiber.  ACTIVITY:  You may use crutches or a walker with weight-bearing as tolerated, unless instructed otherwise. You may be weaned off of the walker or crutches by your Physical Therapist.  Do NOT place pillows under the knee. Anything placed under the knee could limit your ability to straighten the knee.   Continue doing gentle exercises. Exercising will reduce the pain and swelling, increase motion, and prevent muscle weakness.   Please continue to use the TED compression stockings for 6 weeks. You may remove the stockings at night, but should reapply them in the morning. Do not drive or operate any equipment until instructed.  WOUND CARE:  Continue to use the PolarCare or ice packs periodically to reduce pain and swelling. You may bathe or shower after the staples are removed at the first office visit following surgery.  MEDICATIONS: You may resume your regular medications. Please take the pain medication as prescribed on the medication. Do not take pain medication on an empty stomach. You have been given a prescription for a blood thinner (Lovenox or Coumadin). Please take the medication as instructed. (NOTE: After completing a 2 week course of Lovenox, take one Enteric-coated aspirin once a day. This along with elevation will help reduce the possibility of phlebitis in your operated leg.) Do not drive or drink alcoholic beverages when taking pain medications.  CALL THE OFFICE FOR: Temperature above 101 degrees Excessive bleeding or drainage on the dressing. Excessive swelling, coldness, or paleness of the toes. Persistent nausea and vomiting.  FOLLOW-UP:  You  should have an appointment to return to the office in 10-14 days after surgery. Arrangements have been made for continuation of Physical Therapy (either home therapy or outpatient therapy).   [facility] Department Directory         [email or web address]       [email or web address]          Cardiology  Appointments: [place] - [phone] [place] - [phone]  Endocrinology  Appointments: [place] - [phone] [place] - [phone]  Gastroenterology  Appointments: [place] - [phone] [place] - [phone]        General Surgery   Appointments: [place] - [phone]  Internal Medicine/Family Medicine  Appointments: [place] - [phone] [place] - [phone] [place] - [phone]  Metabolic and Weigh Loss Surgery  Appointments: [place] - [phone]        Neurology  Appointments: [place] - [phone] [place] - [phone]  Neurosurgery  Appointments: [place] - [phone]  Obstetrics & Gynecology  Appointments: [place] - [phone] [place] - [phone]        Pediatrics  Appointments: [place] - [phone] [place] - [phone]  Physiatry  Appointments: [place] -[phone]  Physical Therapy  Appointments: [place] - [phone] [place] - [phone]        Podiatry  Appointments: [place] - [phone] [place] - [phone]  Pulmonology  Appointments: [place] - [phone]  Rheumatology  Appointments: [place] - [phone]        [place] Location: [facility]  [address] [place], [place]  [address]  [place] Location: [facility] [address] [place], [place]  [address]  [place] Location: [facility] [address] [place], [place]  [address]    

## 2022-08-24 ENCOUNTER — Encounter
Admission: RE | Admit: 2022-08-24 | Discharge: 2022-08-24 | Disposition: A | Discharge status: Home / Self Care | Payer: Medicare PPO | Source: Ambulatory Visit | Attending: Orthopedic Surgery | Admitting: Orthopedic Surgery

## 2022-08-24 VITALS — BP 138/83 | HR 80 | Temp 98.1°F | Resp 14 | Ht 67.0 in | Wt 205.0 lb

## 2022-08-24 DIAGNOSIS — Z01812 Encounter for preprocedural laboratory examination: Secondary | ICD-10-CM | POA: Insufficient documentation

## 2022-08-24 DIAGNOSIS — M1711 Unilateral primary osteoarthritis, right knee: Secondary | ICD-10-CM | POA: Insufficient documentation

## 2022-08-24 HISTORY — DX: Other intervertebral disc degeneration, lumbar region without mention of lumbar back pain or lower extremity pain: M51.369

## 2022-08-24 HISTORY — DX: Other intervertebral disc degeneration, lumbar region: M51.36

## 2022-08-24 LAB — TYPE AND SCREEN
ABO/RH(D): A POS
Antibody Screen: NEGATIVE

## 2022-08-24 LAB — SEDIMENTATION RATE: Sed Rate: 7 mm/hr (ref 0–30)

## 2022-08-24 LAB — URINALYSIS, ROUTINE W REFLEX MICROSCOPIC
Bilirubin Urine: NEGATIVE
Glucose, UA: NEGATIVE mg/dL
Hgb urine dipstick: NEGATIVE
Ketones, ur: NEGATIVE mg/dL
Leukocytes,Ua: NEGATIVE
Nitrite: NEGATIVE
Protein, ur: NEGATIVE mg/dL
Specific Gravity, Urine: 1.005 (ref 1.005–1.030)
pH: 6 (ref 5.0–8.0)

## 2022-08-24 LAB — C-REACTIVE PROTEIN: CRP: 0.5 mg/dL (ref <1.0)

## 2022-08-24 LAB — SURGICAL PCR SCREEN
MRSA, PCR: NEGATIVE
Staphylococcus aureus: NEGATIVE

## 2022-08-24 NOTE — Patient Instructions (Addendum)
Your procedure is scheduled on: Monday, September 18 Report to the Registration Desk on the 1st floor of the [facility] To find out your arrival time, please call [phone] between 1PM - 3PM on: Friday, September 15 If your arrival time is 6:00 am, do not arrive prior to that time as the [facility] entrance doors do not open until 6:00 am.  REMEMBER: Instructions that are not followed completely may result in serious medical risk, up to and including death; or upon the discretion of your surgeon and anesthesiologist your surgery may need to be rescheduled.  Do not eat food after midnight the night before surgery.  No gum chewing, lozengers or hard candies.  You may however, drink CLEAR liquids up to 2 hours before you are scheduled to arrive for your surgery. Do not drink anything within 2 hours of your scheduled arrival time.  Clear liquids include: - water  - apple juice without pulp - gatorade (not RED colors) - black coffee or tea (Do NOT add milk or creamers to the coffee or tea) Do NOT drink anything that is not on this list.  In addition, your doctor has ordered for you to drink the provided  Ensure Pre-Surgery Clear Carbohydrate Drink  Drinking this carbohydrate drink up to two hours before surgery helps to reduce insulin resistance and improve patient outcomes. Please complete drinking 2 hours prior to scheduled arrival time.  TAKE THESE MEDICATIONS THE MORNING OF SURGERY WITH A SIP OF WATER:  Amlodipine  One week prior to surgery: starting today, September 11 Stop Anti-inflammatories (NSAIDS) such as Advil, Aleve, Ibuprofen, Motrin, Naproxen, Naprosyn and Aspirin based products such as Excedrin, Goodys Powder, BC Powder. Stop ANY OVER THE COUNTER supplements until after surgery. Stop calcium, vitamin D, osteo bi-flex You may however, continue to take Tylenol if needed for pain up until the day of surgery.  No Alcohol for 24 hours before or after surgery.  No  Smoking including e-cigarettes for 24 hours prior to surgery.  No chewable tobacco products for at least 6 hours prior to surgery.  No nicotine patches on the day of surgery.  Do not use any "recreational" drugs for at least a week prior to your surgery.  Please be advised that the combination of cocaine and anesthesia may have negative outcomes, up to and including death. If you test positive for cocaine, your surgery will be cancelled.  On the morning of surgery brush your teeth with toothpaste and water, you may rinse your mouth with mouthwash if you wish. Do not swallow any toothpaste or mouthwash.  Use CHG Soap as directed on instruction sheet.  Do not wear jewelry, make-up, hairpins, clips or nail polish.  Do not wear lotions, powders, or perfumes.   Do not shave body from the neck down 48 hours prior to surgery just in case you cut yourself which could leave a site for infection.  Also, freshly shaved skin may become irritated if using the CHG soap.  Contact lenses, hearing aids and dentures may not be worn into surgery.  Do not bring valuables to the hospital. [facility] is not responsible for any missing/lost belongings or valuables.   Notify your doctor if there is any change in your medical condition (cold, fever, infection).  Wear comfortable clothing (specific to your surgery type) to the hospital.  After surgery, you can help prevent lung complications by doing breathing exercises.  Take deep breaths and cough every 1-2 hours. Your doctor may order a  device called an Incentive Spirometer to help you take deep breaths.  If you are being admitted to the hospital overnight, leave your suitcase in the car. After surgery it may be brought to your room.  If you are being discharged the day of surgery, you will not be allowed to drive home. You will need a responsible adult (18 years or older) to drive you home and stay with you that night.   If you are taking public  transportation, you will need to have a responsible adult (18 years or older) with you. Please confirm with your physician that it is acceptable to use public transportation.   Please call the Pre-admissions Testing Dept. at [phone] if you have any questions about these instructions.  Surgery Visitation Policy:  Patients undergoing a surgery or procedure may have two family members or support persons with them as long as the person is not COVID-19 positive or experiencing its symptoms.   Inpatient Visitation:    Visiting hours are 7 a.m. to 8 p.m. Up to four visitors are allowed at one time in a patient room, including children. The visitors may rotate out with other people during the day. One designated support person (adult) may remain overnight.      Preparing for Surgery with CHLORHEXIDINE GLUCONATE (CHG) Soap  Before surgery, you can play an important role by reducing the number of germs on your skin.  CHG (Chlorhexidine gluconate) soap is an antiseptic cleanser which kills germs and bonds with the skin to continue killing germs even after washing.  Please do not use if you have an allergy to CHG or antibacterial soaps. If your skin becomes reddened/irritated stop using the CHG.  1. Shower the NIGHT BEFORE SURGERY and the MORNING OF SURGERY with CHG soap.  2. If you choose to wash your hair, wash your hair first as usual with your normal shampoo.  3. After shampooing, rinse your hair and body thoroughly to remove the shampoo.  4. Use CHG as you would any other liquid soap. You can apply CHG directly to the skin and wash gently with a scrungie or a clean washcloth.  5. Apply the CHG soap to your body only from the neck down. Do not use on open wounds or open sores. Avoid contact with your eyes, ears, mouth, and genitals (private parts). Wash face and genitals (private parts) with your normal soap.  6. Wash thoroughly, paying special attention to the area where your  surgery will be performed.  7. Thoroughly rinse your body with warm water.  8. Do not shower/wash with your normal soap after using and rinsing off the CHG soap.  9. Pat yourself dry with a clean towel.  10. Wear clean pajamas to bed the night before surgery.  12. Place clean sheets on your bed the night of your first shower and do not sleep with pets.  13. Shower again with the CHG soap on the day of surgery prior to arriving at the hospital.  14. Do not apply any deodorants/lotions/powders.  15. Please wear clean clothes to the hospital.

## 2022-08-27 DIAGNOSIS — M1711 Unilateral primary osteoarthritis, right knee: Secondary | ICD-10-CM | POA: Diagnosis not present

## 2022-08-30 MED ORDER — DEXAMETHASONE SODIUM PHOSPHATE 10 MG/ML IJ SOLN: 8.0000 mg | Freq: Once | INTRAMUSCULAR | Status: AC

## 2022-08-30 MED ORDER — CEFAZOLIN SODIUM-DEXTROSE 2-4 GM/100ML-% IV SOLN
2.0000 g | INTRAVENOUS | Status: AC
  Administered 2022-08-31: 2 g via INTRAVENOUS

## 2022-08-30 MED ORDER — CHLORHEXIDINE GLUCONATE 0.12 % MT SOLN: 15.0000 mL | Freq: Once | OROMUCOSAL | Status: AC

## 2022-08-30 MED ORDER — CELECOXIB 200 MG PO CAPS: 400.0000 mg | ORAL_CAPSULE | Freq: Once | ORAL | Status: AC

## 2022-08-30 MED ORDER — GABAPENTIN 300 MG PO CAPS: 300.0000 mg | ORAL_CAPSULE | Freq: Once | ORAL | Status: AC

## 2022-08-30 MED ORDER — TRANEXAMIC ACID-NACL 1000-0.7 MG/100ML-% IV SOLN
1000.0000 mg | INTRAVENOUS | Status: AC
  Administered 2022-08-31: 1000 mg via INTRAVENOUS

## 2022-08-30 MED ORDER — ORAL CARE MOUTH RINSE: 15.0000 mL | Freq: Once | OROMUCOSAL | Status: AC

## 2022-08-30 MED ORDER — FAMOTIDINE 20 MG PO TABS: 20.0000 mg | ORAL_TABLET | Freq: Once | ORAL | Status: AC

## 2022-08-30 MED ORDER — LACTATED RINGERS IV SOLN: INTRAVENOUS | Status: DC

## 2022-08-30 MED ORDER — CHLORHEXIDINE GLUCONATE 4 % EX LIQD: 60.0000 mL | Freq: Once | CUTANEOUS | Status: DC

## 2022-08-30 NOTE — H&P (Signed)
ORTHOPAEDIC HISTORY & PHYSICAL Gwenlyn Fudge, [place] - 08/27/2022 8:45 AM EDT Formatting of this note is different from the original. [facility] Chief Complaint:   Chief Complaint  Patient presents with  Knee Pain  H & P RIGHT KNEE   History of Present Illness:   Mary Wood is a 66 y.o. female that presents to clinic today for her preoperative history and evaluation. Patient presents unaccompanied. The patient is scheduled to undergo a right total knee arthroplasty on 08/31/22 by Dr. Marry Guan. Her pain began several years ago. The pain is located along the medial aspect of the knee. She describes her pain as worse with weightbearing. She reports associated swelling. She denies associated numbness or tingling, denies locking or giving way.   The patient's symptoms have progressed to the point that they decrease her quality of life. The patient has previously undergone conservative treatment including NSAIDS and injections to the knee without adequate control of her symptoms.  Denies significant cardiac history, history of DVT, or history of lumbar surgery. Her husband will be available to help after surgery.   Of note, patient is previously status post right knee arthroscopy with partial medial meniscectomy and chondroplasty by Dr. Marry Guan on 05/07/2021.  Past Medical, Surgical, Family, Social History, Allergies, Medications:   Past Medical History:  Past Medical History:  Diagnosis Date  Chicken pox  Hypertension  Shingles   Past Surgical History:  Past Surgical History:  Procedure Laterality Date  LEFT ELBOW SURGERY Left 06/25/2011  Right knee arthroscopy, partial medial meniscectomy, and chondroplasty 05/07/2021  Dr Marry Guan  Left knee arthroscopy, partial medial meniscectomy, and chondroplasty 04/08/2022  Dr Marry Guan  TONSILLECTOMY  WISDOM TEETH   Current Medications:  Current Outpatient Medications  Medication Sig Dispense  Refill  acetaminophen (TYLENOL) 650 MG ER tablet Take 650 mg by mouth every 8 (eight) hours as needed  amLODIPine (NORVASC) 5 MG tablet 5 mg once daily  calcium carbonate (CALCIUM 600 ORAL) Take 3 tablets by mouth once daily  cholecalciferol (VITAMIN D3) 1000 unit tablet Take 2,000 Units by mouth once daily  glucosamine/chondr su A sod (OSTEO BI-FLEX ORAL) Take 2 tablets by mouth once daily  losartan (COZAAR) 100 MG tablet 100 mg once daily  scopolamine (TRANSDERM-SCOP) 1 mg over 3 days patch Place 1 patch onto the skin every third day  calcium carbonate-vitamin D3 (OS-CAL 500+D) 500 mg(1,250mg ) -200 unit tablet Take 1 tablet by mouth once daily  celecoxib (CELEBREX) 100 MG capsule Take 1 capsule by mouth twice daily (Patient not taking: Reported on 08/27/2022) 60 capsule 0  docusate (COLACE) 100 MG capsule Take 200 mg by mouth 2 (two) times daily as needed  ibuprofen (MOTRIN) 800 MG tablet Take 800 mg by mouth 2 (two) times daily as needed   No current facility-administered medications for this visit.   Allergies:  Allergies  Allergen Reactions  Allergenic Extracts Other (See Comments)  Stuffy nose itchy eyes  Cat Dander Itching  Itching of eyes  Cat/Feline Products Other (See Comments)  Stuffy nose itchy eyes   Social History:  Social History   Socioeconomic History  Marital status: Married  Spouse name: Marya Amsler  Number of children: 2  Years of education: 16  Highest education level: Bachelor's degree (e.g., BA, AB, BS)  Occupational History  Occupation: RetiredCommunity education officer  Tobacco Use  Smoking status: Never  Smokeless tobacco: Never  Vaping Use  Vaping Use: Never used  Substance and Sexual Activity  Alcohol use:  Yes  Comment: occasionally  Drug use: Never  Sexual activity: Yes  Partners: Male   Family History:  Family History  Problem Relation Age of Onset  High blood pressure (Hypertension) Mother  Stroke Mother  High blood pressure (Hypertension) Father  Stroke Father   High blood pressure (Hypertension) Sister  High blood pressure (Hypertension) Brother  Colon cancer Brother   Review of Systems:   A 10+ ROS was performed, reviewed, and the pertinent orthopaedic findings are documented in the HPI.   Physical Examination:   BP (!) 124/90 (BP Location: Left upper arm, Patient Position: Sitting, BP Cuff Size: Large Adult)  Ht 170.2 cm (5\' 7" )  Wt 91.9 kg (202 lb 9.6 oz)  BMI 31.73 kg/m   Patient is a well-developed, well-nourished female in no acute distress. Patient has normal mood and affect. Patient is alert and oriented to person, place, and time.   HEENT: Atraumatic, normocephalic. Pupils equal and reactive to light. Extraocular motion intact. Noninjected sclera.  Cardiovascular: Regular rate and rhythm, with no murmurs, rubs, or gallops. Distal pulses palpable. No carotid bruits.  Respiratory: Lungs clear to auscultation bilaterally.   Right Knee:  Soft tissue swelling: none Effusion: none Erythema: none Crepitance: mild Tenderness: medial joint line Alignment: normal Mediolateral laxity: stable Anterior drawer test:negative Lachman`s test: negative McMurray`s test: negative Atrophy: No significant atrophy.  Quadriceps tone was fair to good. Range of Motion: 0/4/120 degrees  Sensation intact over the saphenous, lateral sural cutaneous, superficial fibular, and deep fibular nerve distributions.  Tests Performed/Reviewed:  X-rays  X-ray knee right 3 views  Result Date: 08/27/2022 3 views of the right knee were obtained. Images reveal severe loss of medial compartment joint space with significant osteophyte formation. No fractures or dislocations. No osseous abnormality noted.  I personally ordered and interpreted today's x-rays.  Impression:   ICD-10-CM  1. Primary osteoarthritis of right knee M17.11   Plan:   The patient has end-stage degenerative changes of the right knee. It was explained to the patient that the  condition is progressive in nature. Having failed conservative treatment, the patient has elected to proceed with a total joint arthroplasty. The patient will undergo a total joint arthroplasty with Dr. 08/29/2022. The risks of surgery, including blood clot and infection, were discussed with the patient. Measures to reduce these risks, including the use of anticoagulation, perioperative antibiotics, and early ambulation were discussed. The importance of postoperative physical therapy was discussed with the patient. The patient elects to proceed with surgery. The patient is instructed to stop all blood thinners prior to surgery. The patient is instructed to call the hospital the day before surgery to learn of the proper arrival time.   Contact our office with any questions or concerns. Follow up as indicated, or sooner should any new problems arise, if conditions worsen, or if they are otherwise concerned.   Ernest Pine, PA -C [facility] [address] [place], [place] Phone: [phone]  This note was generated in part with voice recognition software and I apologize for any typographical errors that were not detected and corrected.  Electronically signed by [phone], PA at 08/27/2022 6:26 PM EDT

## 2022-08-31 ENCOUNTER — Other Ambulatory Visit: Payer: Self-pay

## 2022-08-31 ENCOUNTER — Encounter: Payer: Self-pay | Admitting: Orthopedic Surgery

## 2022-08-31 ENCOUNTER — Ambulatory Visit: Payer: Medicare PPO | Admitting: Urgent Care

## 2022-08-31 ENCOUNTER — Observation Stay
Admission: RE | Admit: 2022-08-31 | Discharge: 2022-09-01 | Disposition: A | Discharge status: Home Health | Payer: Medicare PPO | Attending: Orthopedic Surgery | Admitting: Orthopedic Surgery

## 2022-08-31 ENCOUNTER — Observation Stay: Payer: Medicare PPO

## 2022-08-31 ENCOUNTER — Encounter
Admission: RE | Disposition: A | Discharge status: Home Health | Payer: Self-pay | Source: Home / Self Care | Attending: Orthopedic Surgery

## 2022-08-31 ENCOUNTER — Ambulatory Visit: Payer: Medicare PPO | Admitting: Certified Registered"

## 2022-08-31 DIAGNOSIS — Z79899 Other long term (current) drug therapy: Secondary | ICD-10-CM | POA: Diagnosis not present

## 2022-08-31 DIAGNOSIS — I10 Essential (primary) hypertension: Secondary | ICD-10-CM | POA: Diagnosis not present

## 2022-08-31 DIAGNOSIS — Z471 Aftercare following joint replacement surgery: Secondary | ICD-10-CM | POA: Diagnosis not present

## 2022-08-31 DIAGNOSIS — Z96651 Presence of right artificial knee joint: Secondary | ICD-10-CM | POA: Diagnosis not present

## 2022-08-31 DIAGNOSIS — Z23 Encounter for immunization: Secondary | ICD-10-CM | POA: Diagnosis not present

## 2022-08-31 DIAGNOSIS — M1711 Unilateral primary osteoarthritis, right knee: Secondary | ICD-10-CM | POA: Diagnosis not present

## 2022-08-31 DIAGNOSIS — Z96659 Presence of unspecified artificial knee joint: Secondary | ICD-10-CM

## 2022-08-31 DIAGNOSIS — E7800 Pure hypercholesterolemia, unspecified: Secondary | ICD-10-CM

## 2022-08-31 HISTORY — PX: KNEE ARTHROPLASTY: SHX992

## 2022-08-31 LAB — ABO/RH: ABO/RH(D): A POS

## 2022-08-31 SURGERY — ARTHROPLASTY, KNEE, TOTAL, USING IMAGELESS COMPUTER-ASSISTED NAVIGATION
Anesthesia: Spinal | Site: Knee | Laterality: Right

## 2022-08-31 MED ORDER — OXYCODONE HCL 5 MG PO TABS
10.0000 mg | ORAL_TABLET | ORAL | Status: DC | PRN
  Administered 2022-08-31 – 2022-09-01 (×3): 10 mg via ORAL
  Filled 2022-08-31 (×3): qty 2

## 2022-08-31 MED ORDER — FENTANYL CITRATE (PF) 100 MCG/2ML IJ SOLN: 25.0000 ug | INTRAMUSCULAR | Status: DC | PRN

## 2022-08-31 MED ORDER — SODIUM CHLORIDE 0.9 % IV SOLN: INTRAVENOUS | Status: DC

## 2022-08-31 MED ORDER — OYSTER SHELL CALCIUM/D3 500-5 MG-MCG PO TABS
2.0000 | ORAL_TABLET | Freq: Every day | ORAL | Status: DC
  Administered 2022-09-01: 2 via ORAL
  Filled 2022-08-31: qty 2

## 2022-08-31 MED ORDER — ACETAMINOPHEN 325 MG PO TABS
325.0000 mg | ORAL_TABLET | Freq: Four times a day (QID) | ORAL | Status: DC | PRN
  Administered 2022-09-01: 650 mg via ORAL
  Filled 2022-08-31: qty 2

## 2022-08-31 MED ORDER — PROPOFOL 1000 MG/100ML IV EMUL
INTRAVENOUS | Status: AC
  Filled 2022-08-31: qty 100

## 2022-08-31 MED ORDER — ONDANSETRON HCL 4 MG PO TABS: 4.0000 mg | ORAL_TABLET | Freq: Four times a day (QID) | ORAL | Status: DC | PRN

## 2022-08-31 MED ORDER — ONDANSETRON HCL 4 MG/2ML IJ SOLN: 4.0000 mg | Freq: Four times a day (QID) | INTRAMUSCULAR | Status: DC | PRN

## 2022-08-31 MED ORDER — SODIUM CHLORIDE 0.9 % IV SOLN
INTRAVENOUS | Status: DC | PRN
  Administered 2022-08-31: 60 mL

## 2022-08-31 MED ORDER — ENOXAPARIN SODIUM 30 MG/0.3ML IJ SOSY
30.0000 mg | PREFILLED_SYRINGE | Freq: Two times a day (BID) | INTRAMUSCULAR | Status: DC
  Administered 2022-09-01: 30 mg via SUBCUTANEOUS
  Filled 2022-08-31: qty 0.3

## 2022-08-31 MED ORDER — PANTOPRAZOLE SODIUM 40 MG PO TBEC
40.0000 mg | DELAYED_RELEASE_TABLET | Freq: Two times a day (BID) | ORAL | Status: DC
  Administered 2022-08-31 – 2022-09-01 (×2): 40 mg via ORAL
  Filled 2022-08-31 (×2): qty 1

## 2022-08-31 MED ORDER — ONDANSETRON HCL 4 MG/2ML IJ SOLN
INTRAMUSCULAR | Status: AC
  Filled 2022-08-31: qty 2

## 2022-08-31 MED ORDER — ACETAMINOPHEN 10 MG/ML IV SOLN
INTRAVENOUS | Status: DC | PRN
  Administered 2022-08-31: 1000 mg via INTRAVENOUS

## 2022-08-31 MED ORDER — CEFAZOLIN SODIUM-DEXTROSE 2-4 GM/100ML-% IV SOLN
INTRAVENOUS | Status: AC
  Filled 2022-08-31: qty 100

## 2022-08-31 MED ORDER — OXYCODONE HCL 5 MG PO TABS
5.0000 mg | ORAL_TABLET | ORAL | Status: DC | PRN
  Administered 2022-08-31: 5 mg via ORAL
  Filled 2022-08-31: qty 1

## 2022-08-31 MED ORDER — FAMOTIDINE 20 MG PO TABS
ORAL_TABLET | ORAL | Status: AC
  Administered 2022-08-31: 20 mg via ORAL
  Filled 2022-08-31: qty 1

## 2022-08-31 MED ORDER — ONDANSETRON HCL 4 MG/2ML IJ SOLN
INTRAMUSCULAR | Status: DC | PRN
  Administered 2022-08-31: 4 mg via INTRAVENOUS

## 2022-08-31 MED ORDER — 0.9 % SODIUM CHLORIDE (POUR BTL) OPTIME
TOPICAL | Status: DC | PRN
  Administered 2022-08-31: 500 mL

## 2022-08-31 MED ORDER — MIDAZOLAM HCL 5 MG/5ML IJ SOLN
INTRAMUSCULAR | Status: DC | PRN
  Administered 2022-08-31: 2 mg via INTRAVENOUS

## 2022-08-31 MED ORDER — VITAMIN D 25 MCG (1000 UNIT) PO TABS
1000.0000 [IU] | ORAL_TABLET | Freq: Every day | ORAL | Status: DC
  Administered 2022-09-01: 1000 [IU] via ORAL
  Filled 2022-08-31: qty 1

## 2022-08-31 MED ORDER — SODIUM CHLORIDE 0.9 % IR SOLN
Status: DC | PRN
  Administered 2022-08-31: 3000 mL

## 2022-08-31 MED ORDER — DEXAMETHASONE SODIUM PHOSPHATE 10 MG/ML IJ SOLN
INTRAMUSCULAR | Status: DC | PRN
  Administered 2022-08-31: 10 mg via INTRAVENOUS

## 2022-08-31 MED ORDER — TRANEXAMIC ACID-NACL 1000-0.7 MG/100ML-% IV SOLN
INTRAVENOUS | Status: AC
  Filled 2022-08-31: qty 100

## 2022-08-31 MED ORDER — SURGIPHOR WOUND IRRIGATION SYSTEM - OPTIME
TOPICAL | Status: DC | PRN
  Administered 2022-08-31: 450 mL via TOPICAL

## 2022-08-31 MED ORDER — HYDROMORPHONE HCL 1 MG/ML IJ SOLN: 0.5000 mg | INTRAMUSCULAR | Status: DC | PRN

## 2022-08-31 MED ORDER — BISACODYL 10 MG RE SUPP: 10.0000 mg | Freq: Every day | RECTAL | Status: DC | PRN

## 2022-08-31 MED ORDER — INFLUENZA VAC A&B SA ADJ QUAD 0.5 ML IM PRSY
0.5000 mL | PREFILLED_SYRINGE | INTRAMUSCULAR | Status: AC
  Administered 2022-09-01: 0.5 mL via INTRAMUSCULAR
  Filled 2022-08-31: qty 0.5

## 2022-08-31 MED ORDER — TRAMADOL HCL 50 MG PO TABS
50.0000 mg | ORAL_TABLET | ORAL | Status: DC | PRN
  Administered 2022-08-31 – 2022-09-01 (×2): 100 mg via ORAL
  Filled 2022-08-31 (×2): qty 2

## 2022-08-31 MED ORDER — MENTHOL 3 MG MT LOZG: 1.0000 | LOZENGE | OROMUCOSAL | Status: DC | PRN

## 2022-08-31 MED ORDER — CELECOXIB 200 MG PO CAPS
ORAL_CAPSULE | ORAL | Status: AC
  Administered 2022-08-31: 400 mg via ORAL
  Filled 2022-08-31: qty 2

## 2022-08-31 MED ORDER — CELECOXIB 200 MG PO CAPS
200.0000 mg | ORAL_CAPSULE | Freq: Two times a day (BID) | ORAL | Status: DC
  Administered 2022-08-31 – 2022-09-01 (×2): 200 mg via ORAL
  Filled 2022-08-31 (×2): qty 1

## 2022-08-31 MED ORDER — ENSURE PRE-SURGERY PO LIQD
296.0000 mL | Freq: Once | ORAL | Status: AC
  Administered 2022-08-31: 296 mL via ORAL

## 2022-08-31 MED ORDER — CHLORHEXIDINE GLUCONATE 0.12 % MT SOLN
OROMUCOSAL | Status: AC
  Administered 2022-08-31: 15 mL via OROMUCOSAL
  Filled 2022-08-31: qty 15

## 2022-08-31 MED ORDER — MIDAZOLAM HCL 2 MG/2ML IJ SOLN
INTRAMUSCULAR | Status: AC
  Filled 2022-08-31: qty 2

## 2022-08-31 MED ORDER — ALUM & MAG HYDROXIDE-SIMETH 200-200-20 MG/5ML PO SUSP: 30.0000 mL | ORAL | Status: DC | PRN

## 2022-08-31 MED ORDER — GABAPENTIN 300 MG PO CAPS
ORAL_CAPSULE | ORAL | Status: AC
  Administered 2022-08-31: 300 mg via ORAL
  Filled 2022-08-31: qty 1

## 2022-08-31 MED ORDER — BUPIVACAINE HCL (PF) 0.5 % IJ SOLN
INTRAMUSCULAR | Status: DC | PRN
  Administered 2022-08-31: 3 mL

## 2022-08-31 MED ORDER — FERROUS SULFATE 325 (65 FE) MG PO TABS
325.0000 mg | ORAL_TABLET | Freq: Two times a day (BID) | ORAL | Status: DC
  Administered 2022-08-31 – 2022-09-01 (×2): 325 mg via ORAL
  Filled 2022-08-31 (×2): qty 1

## 2022-08-31 MED ORDER — BUPIVACAINE HCL (PF) 0.25 % IJ SOLN
INTRAMUSCULAR | Status: DC | PRN
  Administered 2022-08-31: 60 mL

## 2022-08-31 MED ORDER — METOCLOPRAMIDE HCL 5 MG PO TABS
10.0000 mg | ORAL_TABLET | Freq: Three times a day (TID) | ORAL | Status: DC
  Administered 2022-08-31 – 2022-09-01 (×3): 10 mg via ORAL
  Filled 2022-08-31 (×3): qty 2

## 2022-08-31 MED ORDER — DEXAMETHASONE SODIUM PHOSPHATE 10 MG/ML IJ SOLN
INTRAMUSCULAR | Status: AC
  Filled 2022-08-31: qty 1

## 2022-08-31 MED ORDER — LOSARTAN POTASSIUM 50 MG PO TABS
100.0000 mg | ORAL_TABLET | Freq: Every day | ORAL | Status: DC
  Administered 2022-09-01: 100 mg via ORAL
  Filled 2022-08-31: qty 2

## 2022-08-31 MED ORDER — PHENYLEPHRINE HCL-NACL 20-0.9 MG/250ML-% IV SOLN
INTRAVENOUS | Status: DC | PRN
  Administered 2022-08-31: 20 ug/min via INTRAVENOUS

## 2022-08-31 MED ORDER — ACETAMINOPHEN 10 MG/ML IV SOLN
INTRAVENOUS | Status: AC
  Filled 2022-08-31: qty 100

## 2022-08-31 MED ORDER — MAGNESIUM HYDROXIDE 400 MG/5ML PO SUSP
30.0000 mL | Freq: Every day | ORAL | Status: DC
  Administered 2022-09-01: 30 mL via ORAL
  Filled 2022-08-31: qty 30

## 2022-08-31 MED ORDER — AMLODIPINE BESYLATE 5 MG PO TABS
5.0000 mg | ORAL_TABLET | Freq: Every day | ORAL | Status: DC
  Administered 2022-09-01: 5 mg via ORAL
  Filled 2022-08-31: qty 1

## 2022-08-31 MED ORDER — SENNOSIDES-DOCUSATE SODIUM 8.6-50 MG PO TABS
1.0000 | ORAL_TABLET | Freq: Two times a day (BID) | ORAL | Status: DC
  Administered 2022-09-01: 1 via ORAL
  Filled 2022-08-31 (×2): qty 1

## 2022-08-31 MED ORDER — OXYCODONE HCL 5 MG/5ML PO SOLN: 5.0000 mg | Freq: Once | ORAL | Status: DC | PRN

## 2022-08-31 MED ORDER — DEXAMETHASONE SODIUM PHOSPHATE 10 MG/ML IJ SOLN
INTRAMUSCULAR | Status: AC
  Administered 2022-08-31: 8 mg via INTRAVENOUS
  Filled 2022-08-31: qty 1

## 2022-08-31 MED ORDER — OXYCODONE HCL 5 MG PO TABS: 5.0000 mg | ORAL_TABLET | Freq: Once | ORAL | Status: DC | PRN

## 2022-08-31 MED ORDER — PHENOL 1.4 % MT LIQD: 1.0000 | OROMUCOSAL | Status: DC | PRN

## 2022-08-31 MED ORDER — DIPHENHYDRAMINE HCL 12.5 MG/5ML PO ELIX
12.5000 mg | ORAL_SOLUTION | ORAL | Status: DC | PRN
  Administered 2022-09-01: 25 mg via ORAL
  Filled 2022-08-31: qty 10

## 2022-08-31 MED ORDER — FLEET ENEMA 7-19 GM/118ML RE ENEM: 1.0000 | ENEMA | Freq: Once | RECTAL | Status: DC | PRN

## 2022-08-31 MED ORDER — TRANEXAMIC ACID-NACL 1000-0.7 MG/100ML-% IV SOLN
1000.0000 mg | Freq: Once | INTRAVENOUS | Status: AC
  Administered 2022-08-31: 1000 mg via INTRAVENOUS

## 2022-08-31 MED ORDER — ACETAMINOPHEN 10 MG/ML IV SOLN
1000.0000 mg | Freq: Four times a day (QID) | INTRAVENOUS | Status: DC
  Administered 2022-08-31 – 2022-09-01 (×2): 1000 mg via INTRAVENOUS
  Filled 2022-08-31 (×4): qty 100

## 2022-08-31 MED ORDER — CEFAZOLIN SODIUM-DEXTROSE 2-4 GM/100ML-% IV SOLN
2.0000 g | Freq: Four times a day (QID) | INTRAVENOUS | Status: AC
  Administered 2022-08-31 – 2022-09-01 (×2): 2 g via INTRAVENOUS
  Filled 2022-08-31 (×2): qty 100

## 2022-08-31 MED ORDER — PROPOFOL 500 MG/50ML IV EMUL
INTRAVENOUS | Status: DC | PRN
  Administered 2022-08-31: 60 ug/kg/min via INTRAVENOUS

## 2022-08-31 SURGICAL SUPPLY — 80 items
ATTUNE PSFEM RTSZ6 NARCEM KNEE (Femur) IMPLANT
ATTUNE PSRP INSR SZ6 5 KNEE (Insert) IMPLANT
BASE TIBIAL ROT PLAT SZ 5 KNEE (Knees) IMPLANT
BATTERY INSTRU NAVIGATION (MISCELLANEOUS) ×8 IMPLANT
BLADE SAW 70X12.5 (BLADE) ×2 IMPLANT
BLADE SAW 90X13X1.19 OSCILLAT (BLADE) ×2 IMPLANT
BLADE SAW 90X25X1.19 OSCILLAT (BLADE) ×2 IMPLANT
BONE CEMENT GENTAMICIN (Cement) ×2 IMPLANT
BSPLAT TIB 5 CMNT ROT PLAT STR (Knees) ×1 IMPLANT
BTRY SRG DRVR LF (MISCELLANEOUS) ×4
CATH COUDE FOLEY 5CC 14FR (CATHETERS) IMPLANT
CEMENT BONE GENTAMICIN 40 (Cement) IMPLANT
COOLER POLAR GLACIER W/PUMP (MISCELLANEOUS) ×2 IMPLANT
CUFF TOURN SGL QUICK 24 (TOURNIQUET CUFF)
CUFF TOURN SGL QUICK 34 (TOURNIQUET CUFF)
CUFF TRNQT CYL 24X4X16.5-23 (TOURNIQUET CUFF) IMPLANT
CUFF TRNQT CYL 34X4.125X (TOURNIQUET CUFF) IMPLANT
DRAPE 3/4 80X56 (DRAPES) ×2 IMPLANT
DRAPE INCISE IOBAN 66X45 STRL (DRAPES) IMPLANT
DRSG DERMACEA 8X12 NADH (GAUZE/BANDAGES/DRESSINGS) IMPLANT
DRSG DERMACEA NONADH 3X8 (GAUZE/BANDAGES/DRESSINGS) ×2 IMPLANT
DRSG MEPILEX SACRM 8.7X9.8 (GAUZE/BANDAGES/DRESSINGS) ×2 IMPLANT
DRSG OPSITE POSTOP 4X14 (GAUZE/BANDAGES/DRESSINGS) ×2 IMPLANT
DRSG TEGADERM 4X4.5 CHG (GAUZE/BANDAGES/DRESSINGS) IMPLANT
DRSG TEGADERM 4X4.75 (GAUZE/BANDAGES/DRESSINGS) ×2 IMPLANT
DURAPREP 26ML APPLICATOR (WOUND CARE) ×4 IMPLANT
ELECT CAUTERY BLADE 6.4 (BLADE) ×2 IMPLANT
ELECT REM PT RETURN 9FT ADLT (ELECTROSURGICAL) ×1
ELECTRODE REM PT RTRN 9FT ADLT (ELECTROSURGICAL) ×2 IMPLANT
EX-PIN ORTHOLOCK NAV 4X150 (PIN) ×4 IMPLANT
GLOVE BIO SURGEON STRL SZ7 (GLOVE) ×4 IMPLANT
GLOVE BIOGEL M STRL SZ7.5 (GLOVE) ×4 IMPLANT
GLOVE BIOGEL PI IND STRL 8 (GLOVE) ×2 IMPLANT
GLOVE PI ORTHO PRO STRL 7.5 (GLOVE) ×4 IMPLANT
GLOVE SURG UNDER POLY LF SZ7.5 (GLOVE) ×2 IMPLANT
GOWN STRL REUS W/ TWL LRG LVL3 (GOWN DISPOSABLE) ×4 IMPLANT
GOWN STRL REUS W/ TWL XL LVL3 (GOWN DISPOSABLE) ×2 IMPLANT
GOWN STRL REUS W/TWL LRG LVL3 (GOWN DISPOSABLE) ×2
GOWN STRL REUS W/TWL XL LVL3 (GOWN DISPOSABLE) ×1
HEMOVAC 400CC 10FR (MISCELLANEOUS) ×2 IMPLANT
HOLDER FOLEY CATH W/STRAP (MISCELLANEOUS) ×2 IMPLANT
HOLSTER ELECTROSUGICAL PENCIL (MISCELLANEOUS) ×2 IMPLANT
HOOD PEEL AWAY FLYTE STAYCOOL (MISCELLANEOUS) ×4 IMPLANT
IV NS IRRIG 3000ML ARTHROMATIC (IV SOLUTION) ×2 IMPLANT
KIT TURNOVER KIT A (KITS) ×2 IMPLANT
KNIFE SCULPS 14X20 (INSTRUMENTS) ×2 IMPLANT
MANIFOLD NEPTUNE II (INSTRUMENTS) ×4 IMPLANT
NDL SPNL 20GX3.5 QUINCKE YW (NEEDLE) ×4 IMPLANT
NEEDLE SPNL 20GX3.5 QUINCKE YW (NEEDLE) ×2 IMPLANT
NS IRRIG 500ML POUR BTL (IV SOLUTION) ×2 IMPLANT
PACK TOTAL KNEE (MISCELLANEOUS) ×2 IMPLANT
PAD ABD DERMACEA PRESS 5X9 (GAUZE/BANDAGES/DRESSINGS) ×4 IMPLANT
PAD WRAPON POLAR KNEE (MISCELLANEOUS) ×2 IMPLANT
PATELLA MEDIAL ATTUN 35MM KNEE (Knees) IMPLANT
PIN DRILL FIX HALF THREAD (BIT) ×4 IMPLANT
PIN DRILL QUICK PACK ×4 IMPLANT
PIN FIXATION 1/8DIA X 3INL (PIN) ×2 IMPLANT
PULSAVAC PLUS IRRIG FAN TIP (DISPOSABLE) ×1
SOL PREP PVP 2OZ (MISCELLANEOUS) ×1
SOLUTION IRRIG SURGIPHOR (IV SOLUTION) ×2 IMPLANT
SOLUTION PREP PVP 2OZ (MISCELLANEOUS) ×2 IMPLANT
SPONGE DRAIN TRACH 4X4 STRL 2S (GAUZE/BANDAGES/DRESSINGS) ×2 IMPLANT
STAPLER SKIN PROX 35W (STAPLE) ×2 IMPLANT
STOCKINETTE IMPERV 14X48 (MISCELLANEOUS) IMPLANT
STRAP TIBIA SHORT (MISCELLANEOUS) ×2 IMPLANT
SUCTION FRAZIER HANDLE 10FR (MISCELLANEOUS) ×1
SUCTION TUBE FRAZIER 10FR DISP (MISCELLANEOUS) ×2 IMPLANT
SUT VIC AB 0 CT1 36 (SUTURE) ×4 IMPLANT
SUT VIC AB 1 CT1 36 (SUTURE) ×4 IMPLANT
SUT VIC AB 2-0 CT2 27 (SUTURE) ×2 IMPLANT
SYR 30ML LL (SYRINGE) ×4 IMPLANT
TIBIAL BASE ROT PLAT SZ 5 KNEE (Knees) ×1 IMPLANT
TIP FAN IRRIG PULSAVAC PLUS (DISPOSABLE) ×2 IMPLANT
TOWEL OR 17X26 4PK STRL BLUE (TOWEL DISPOSABLE) IMPLANT
TOWER CARTRIDGE SMART MIX (DISPOSABLE) ×2 IMPLANT
TRAP FLUID SMOKE EVACUATOR (MISCELLANEOUS) ×2 IMPLANT
TRAY FOLEY MTR SLVR 16FR STAT (SET/KITS/TRAYS/PACK) ×2 IMPLANT
WATER STERILE IRR 1000ML POUR (IV SOLUTION) IMPLANT
WATER STERILE IRR 500ML POUR (IV SOLUTION) ×2 IMPLANT
WRAPON POLAR PAD KNEE (MISCELLANEOUS) ×1

## 2022-08-31 NOTE — Transfer of Care (Signed)
Immediate Anesthesia Transfer of Care Note  Patient: Mary Wood  Procedure(s) Performed: COMPUTER ASSISTED TOTAL KNEE ARTHROPLASTY (Right: Knee)  Patient Location: PACU  Anesthesia Type:Spinal  Level of Consciousness: awake  Airway & Oxygen Therapy: Patient Spontanous Breathing  Post-op Assessment: Report given to RN and Post -op Vital signs reviewed and stable  Post vital signs: Reviewed and stable  Last Vitals:  Vitals Value Taken Time  BP 110/75 1525  Temp 98.1 1525  Pulse 74 1525  Resp 18 1525  SpO2 97 1525    Last Pain:  Vitals:   08/31/22 0943  TempSrc: Temporal  PainSc: 7          Complications: No notable events documented.

## 2022-08-31 NOTE — Anesthesia Procedure Notes (Signed)
Spinal  Patient location during procedure: OR Start time: 08/31/2022 11:44 AM End time: 08/31/2022 11:52 AM Reason for block: surgical anesthesia Staffing Performed: resident/CRNA  Anesthesiologist: Dimas Millin, MD Resident/CRNA: Esaw Grandchild, CRNA Performed by: Esaw Grandchild, CRNA Authorized by: Dimas Millin, MD   Preanesthetic Checklist Completed: patient identified, IV checked, site marked, risks and benefits discussed, surgical consent, monitors and equipment checked, pre-op evaluation and timeout performed Spinal Block Patient position: sitting Prep: ChloraPrep Patient monitoring: heart rate, continuous pulse ox and blood pressure Approach: midline Location: L3-4 Injection technique: single-shot Needle Needle type: Pencan  Needle gauge: 24 G Needle length: 9 cm Assessment Sensory level: T6 Events: CSF return

## 2022-08-31 NOTE — Interval H&P Note (Signed)
History and Physical Interval Note:  08/31/2022 23:53 AM  Mary Wood  has presented today for surgery, with the diagnosis of PRIMARY OSTEOARTHRITIS OF RIGHT KNEE..  The various methods of treatment have been discussed with the patient and family. After consideration of risks, benefits and other options for treatment, the patient has consented to  Procedure(s): COMPUTER ASSISTED TOTAL KNEE ARTHROPLASTY (Right) as a surgical intervention.  The patient's history has been reviewed, patient examined, no change in status, stable for surgery.  I have reviewed the patient's chart and labs.  Questions were answered to the patient's satisfaction.     [place]

## 2022-08-31 NOTE — Anesthesia Preprocedure Evaluation (Addendum)
Anesthesia Evaluation  Patient identified by MRN, date of birth, ID band Patient awake    Reviewed: Allergy & Precautions, NPO status , Patient's Chart, lab work & pertinent test results  History of Anesthesia Complications Negative for: history of anesthetic complications  Airway Mallampati: II   Neck ROM: Full    Dental  (+) Missing, Chipped, Dental Advidsory Given   Pulmonary neg pulmonary ROS, neg shortness of breath, neg sleep apnea,    Pulmonary exam normal breath sounds clear to auscultation       Cardiovascular hypertension, (-) Past MI and (-) CABG Normal cardiovascular exam Rhythm:Regular Rate:Normal  ECG 04/06/22:  Normal sinus rhythm Cannot rule out Anterior infarct , age undetermined   Neuro/Psych Hx alcohol use disorder; last intake 04/05/22    GI/Hepatic negative GI ROS,   Endo/Other  Obesity   Renal/GU negative Renal ROS     Musculoskeletal  (+) Arthritis ,   Abdominal   Peds  Hematology negative hematology ROS (+)   Anesthesia Other Findings   Reproductive/Obstetrics                            Anesthesia Physical  Anesthesia Plan  ASA: 2  Anesthesia Plan: Spinal   Post-op Pain Management:    Induction: Intravenous  PONV Risk Score and Plan: 3 and Ondansetron, Dexamethasone and Treatment may vary due to age or medical condition  Airway Management Planned: Natural Airway and Nasal Cannula  Additional Equipment:   Intra-op Plan:   Post-operative Plan: Extubation in OR  Informed Consent: I have reviewed the patients History and Physical, chart, labs and discussed the procedure including the risks, benefits and alternatives for the proposed anesthesia with the patient or authorized representative who has indicated his/her understanding and acceptance.     Dental Advisory Given  Plan Discussed with: Anesthesiologist, CRNA and Surgeon  Anesthesia Plan  Comments: (Patient reports no bleeding problems and no anticoagulant use.  Plan for spinal with backup GA  Patient consented for risks of anesthesia including but not limited to:  - adverse reactions to medications - damage to eyes, teeth, lips or other oral mucosa - nerve damage due to positioning  - risk of bleeding, infection and or nerve damage from spinal that could lead to paralysis - risk of headache or failed spinal - damage to teeth, lips or other oral mucosa - sore throat or hoarseness - damage to heart, brain, nerves, lungs, other parts of body or loss of life  Patient voiced understanding.)        Anesthesia Quick Evaluation

## 2022-08-31 NOTE — Progress Notes (Signed)
PT Cancellation Note  Patient Details Name: Mary Wood MRN: [id number] DOB: [birth year]   Cancelled Treatment:    Reason Eval/Treat Not Completed: Patient not medically ready.  PT consult received.  Chart reviewed.  Pt s/p R TKA with spinal anesthesia.  Discussed pt's current status with pt's nurse and also pt.  Pt currently does not have appropriate LE strength and sensation return to safely participate in therapy at this time.  Will re-attempt PT evaluation tomorrow.  Leitha Bleak, PT 08/31/22, 4:48 PM

## 2022-08-31 NOTE — Op Note (Signed)
OPERATIVE NOTE  DATE OF SURGERY:  08/31/2022  PATIENT NAME:  Mary Wood   DOB: Feb 01, 1956  MRN: [id number]  PRE-OPERATIVE DIAGNOSIS: Degenerative arthrosis of the right knee, primary  POST-OPERATIVE DIAGNOSIS:  Same  PROCEDURE:  Right total knee arthroplasty using computer-assisted navigation  SURGEON:  Jena Gauss. M.D.  ASSISTANT: Baldwin Jamaica, PA-C (present and scrubbed throughout the case, critical for assistance with exposure, retraction, instrumentation, and closure)  ANESTHESIA: spinal  ESTIMATED BLOOD LOSS: 50 mL  FLUIDS REPLACED: 500 mL of crystalloid  TOURNIQUET TIME: 84 minutes  DRAINS: 2 medium Hemovac drains  SOFT TISSUE RELEASES: Anterior cruciate ligament, posterior cruciate ligament, deep medial collateral ligament, patellofemoral ligament  IMPLANTS UTILIZED: DePuy Attune size 6N posterior stabilized femoral component (cemented), size 5 rotating platform tibial component (cemented), 35 mm medialized dome patella (cemented), and a 5 mm stabilized rotating platform polyethylene insert.  INDICATIONS FOR SURGERY: Mary Wood is a 66 y.o. year old female with a long history of progressive knee pain. X-rays demonstrated severe degenerative changes in tricompartmental fashion. The patient had not seen any significant improvement despite conservative nonsurgical intervention. After discussion of the risks and benefits of surgical intervention, the patient expressed understanding of the risks benefits and agree with plans for total knee arthroplasty.   The risks, benefits, and alternatives were discussed at length including but not limited to the risks of infection, bleeding, nerve injury, stiffness, blood clots, the need for revision surgery, cardiopulmonary complications, among others, and they were willing to proceed.  PROCEDURE IN DETAIL: The patient was brought into the operating room and, after adequate spinal anesthesia was achieved, a tourniquet was placed on  the patient's upper thigh. The patient's knee and leg were cleaned and prepped with alcohol and DuraPrep and draped in the usual sterile fashion. A "timeout" was performed as per usual protocol. The lower extremity was exsanguinated using an Esmarch, and the tourniquet was inflated to 300 mmHg. An anterior longitudinal incision was made followed by a standard mid vastus approach. The deep fibers of the medial collateral ligament were elevated in a subperiosteal fashion off of the medial flare of the tibia so as to maintain a continuous soft tissue sleeve. The patella was subluxed laterally and the patellofemoral ligament was incised. Inspection of the knee demonstrated severe degenerative changes with full-thickness loss of articular cartilage. Osteophytes were debrided using a rongeur. Anterior and posterior cruciate ligaments were excised. Two 4.0 mm Schanz pins were inserted in the femur and into the tibia for attachment of the array of trackers used for computer-assisted navigation. Hip center was identified using a circumduction technique. Distal landmarks were mapped using the computer. The distal femur and proximal tibia were mapped using the computer. The distal femoral cutting guide was positioned using computer-assisted navigation so as to achieve a 5 distal valgus cut. The femur was sized and it was felt that a size 6N femoral component was appropriate. A size 6 femoral cutting guide was positioned and the anterior cut was performed and verified using the computer. This was followed by completion of the posterior and chamfer cuts. Femoral cutting guide for the central box was then positioned in the center box cut was performed.  Attention was then directed to the proximal tibia. Medial and lateral menisci were excised. The extramedullary tibial cutting guide was positioned using computer-assisted navigation so as to achieve a 0 varus-valgus alignment and 3 posterior slope. The cut was performed and  verified using the computer. The proximal tibia was  sized and it was felt that a size 5 tibial tray was appropriate. Tibial and femoral trials were inserted followed by insertion of a 5 mm polyethylene insert. This allowed for excellent mediolateral soft tissue balancing both in flexion and in full extension. Finally, the patella was cut and prepared so as to accommodate a 35 mm medialized dome patella. A patella trial was placed and the knee was placed through a range of motion with excellent patellar tracking appreciated. The femoral trial was removed after debridement of posterior osteophytes. The central post-hole for the tibial component was reamed followed by insertion of a keel punch. Tibial trials were then removed. Cut surfaces of bone were irrigated with copious amounts of normal saline using pulsatile lavage and then suctioned dry. Polymethylmethacrylate cement with gentamicin was prepared in the usual fashion using a vacuum mixer. Cement was applied to the cut surface of the proximal tibia as well as along the undersurface of a size 5 rotating platform tibial component. Tibial component was positioned and impacted into place. Excess cement was removed using [employer] fast streamer. Cement was then applied to the cut surfaces of the femur as well as along the posterior flanges of the size 6N femoral component. The femoral component was positioned and impacted into place. Excess cement was removed using [employer] fast streamer. A 5 mm polyethylene trial was inserted and the knee was brought into full extension with steady axial compression applied. Finally, cement was applied to the backside of a 35 mm medialized dome patella and the patellar component was positioned and patellar clamp applied. Excess cement was removed using [employer] fast streamer. After adequate curing of the cement, the tourniquet was deflated after a total tourniquet time of 84 minutes. Hemostasis was achieved using electrocautery. The knee was irrigated  with copious amounts of normal saline using pulsatile lavage followed by 450 ml of Surgiphor and then suctioned dry. 20 mL of 1.3% Exparel and 60 mL of 0.25% Marcaine in 40 mL of normal saline was injected along the posterior capsule, medial and lateral gutters, and along the arthrotomy site. A 5 mm stabilized rotating platform polyethylene insert was inserted and the knee was placed through a range of motion with excellent mediolateral soft tissue balancing appreciated and excellent patellar tracking noted. 2 medium drains were placed in the wound bed and brought out through separate stab incisions. The medial parapatellar portion of the incision was reapproximated using interrupted sutures of #1 Vicryl. Subcutaneous tissue was approximated in layers using first #0 Vicryl followed #2-0 Vicryl. The skin was approximated with skin staples. A sterile dressing was applied.  The patient tolerated the procedure well and was transported to the recovery room in stable condition.    Sharesa Kemp P. Holley Bouche., M.D.

## 2022-09-01 ENCOUNTER — Encounter: Payer: Self-pay | Admitting: Orthopedic Surgery

## 2022-09-01 ENCOUNTER — Encounter: Payer: Self-pay | Admitting: Nurse Practitioner

## 2022-09-01 DIAGNOSIS — Z79899 Other long term (current) drug therapy: Secondary | ICD-10-CM | POA: Diagnosis not present

## 2022-09-01 DIAGNOSIS — Z23 Encounter for immunization: Secondary | ICD-10-CM | POA: Diagnosis not present

## 2022-09-01 DIAGNOSIS — I10 Essential (primary) hypertension: Secondary | ICD-10-CM | POA: Diagnosis not present

## 2022-09-01 DIAGNOSIS — M1711 Unilateral primary osteoarthritis, right knee: Secondary | ICD-10-CM | POA: Diagnosis not present

## 2022-09-01 MED ORDER — CELECOXIB 200 MG PO CAPS: 200.0000 mg | ORAL_CAPSULE | Freq: Two times a day (BID) | ORAL | 0 refills | Status: DC

## 2022-09-01 MED ORDER — ENOXAPARIN SODIUM 40 MG/0.4ML IJ SOSY: 40.0000 mg | PREFILLED_SYRINGE | INTRAMUSCULAR | 0 refills | Status: DC

## 2022-09-01 MED ORDER — TRAMADOL HCL 50 MG PO TABS: 50.0000 mg | ORAL_TABLET | ORAL | 0 refills | Status: DC | PRN

## 2022-09-01 MED ORDER — OXYCODONE HCL 5 MG PO TABS: 5.0000 mg | ORAL_TABLET | ORAL | 0 refills | Status: DC | PRN

## 2022-09-01 NOTE — Progress Notes (Signed)
[employer] delivered the RW and 3 in 1 to the bedside [facility] is set up for [facility] services Spouse to provide transportation

## 2022-09-01 NOTE — Discharge Summary (Signed)
Physician Discharge Summary  Patient ID: Mary Wood MRN: [id number] DOB/AGE: [birth year] 66 y.o.  Admit date: 08/31/2022 Discharge date: 09/01/2022  Admission Diagnoses:  Total knee replacement status [Z96.659]  Surgeries:Procedure(s): Right total knee arthroplasty using computer-assisted navigation   SURGEON:  Jena Gauss. M.D.   ASSISTANT: Baldwin Jamaica, PA-C (present and scrubbed throughout the case, critical for assistance with exposure, retraction, instrumentation, and closure)   ANESTHESIA: spinal   ESTIMATED BLOOD LOSS: 50 mL   FLUIDS REPLACED: 500 mL of crystalloid   TOURNIQUET TIME: 84 minutes   DRAINS: 2 medium Hemovac drains   SOFT TISSUE RELEASES: Anterior cruciate ligament, posterior cruciate ligament, deep medial collateral ligament, patellofemoral ligament   IMPLANTS UTILIZED: DePuy Attune size 6N posterior stabilized femoral component (cemented), size 5 rotating platform tibial component (cemented), 35 mm medialized dome patella (cemented), and a 5 mm stabilized rotating platform polyethylene insert.  Discharge Diagnoses: Patient Active Problem List   Diagnosis Date Noted   Total knee replacement status 08/31/2022   Elevated LDL cholesterol level 08/26/2020   Degenerative disc disease, lumbar 12/20/2018   Family history of colon cancer 09/30/2015   Lactose intolerance 09/30/2015   Osteopenia of lumbar spine 09/30/2015   Hypertension 09/30/2015   Obesity 09/30/2015    Past Medical History:  Diagnosis Date   Arthritis    knees   Degenerative disc disease, lumbar    Hypertension    Osteoporosis      Transfusion:    Consultants (if any):   Discharged Condition: Improved  Hospital Course: Mary Wood is an 66 y.o. female who was admitted 08/31/2022 with a diagnosis of right knee osteoarthritis and went to the operating room on 08/31/2022 and underwent right total knee arthroplasty. The patient received perioperative antibiotics for  prophylaxis (see below). The patient tolerated the procedure well and was transported to PACU in stable condition. After meeting PACU criteria, the patient was subsequently transferred to the Orthopaedics/Rehabilitation unit.   The patient received DVT prophylaxis in the form of early mobilization, Lovenox, TED hose, and SCDs . A sacral pad had been placed and heels were elevated off of the bed with rolled towels in order to protect skin integrity. Foley catheter was discontinued on postoperative day #0. Wound drains were discontinued on postoperative day #1. The surgical incision was healing well without signs of infection.  Physical therapy was initiated postoperatively for transfers, gait training, and strengthening. Occupational therapy was initiated for activities of daily living and evaluation for assisted devices. Rehabilitation goals were reviewed in detail with the patient. The patient made steady progress with physical therapy and physical therapy recommended discharge to Home.   The patient achieved the preliminary goals of this hospitalization and was felt to be medically and orthopaedically appropriate for discharge.  She was given perioperative antibiotics:  Anti-infectives (From admission, onward)    Start     Dose/Rate Route Frequency Ordered Stop   08/31/22 1800  ceFAZolin (ANCEF) IVPB 2g/100 mL premix        2 g 200 mL/hr over 30 Minutes Intravenous Every 6 hours 08/31/22 1638 09/01/22 0840   08/31/22 0939  ceFAZolin (ANCEF) 2-4 GM/100ML-% IVPB       Note to Pharmacy: Guadalupe Dawn B: cabinet override      08/31/22 0939 08/31/22 1207   08/31/22 0600  ceFAZolin (ANCEF) IVPB 2g/100 mL premix        2 g 200 mL/hr over 30 Minutes Intravenous On call to O.R. 08/30/22 2204 08/31/22 1205     .  Recent vital signs:  Vitals:   09/01/22 0545 09/01/22 0907  BP: 105/61 130/71  Pulse: 74 74  Resp: 18   Temp: 98.8 F (37.1 C) 97.9 F (36.6 C)  SpO2: 97% 99%    Recent  laboratory studies:  No results for input(s): "WBC", "HGB", "HCT", "PLT", "K", "CL", "CO2", "BUN", "CREATININE", "GLUCOSE", "CALCIUM", "LABPT", "INR" in the last 72 hours.  Diagnostic Studies: DG Knee Right Port  Result Date: 08/31/2022 CLINICAL DATA:  Status post total knee replacement EXAM: PORTABLE RIGHT KNEE - 1-2 VIEW COMPARISON:  None Available. FINDINGS: Total knee arthroplasty. Prosthetic components are well seated. Expected soft tissue changes in the anterior knee. Surgical drain remains in place. IMPRESSION: Total knee arthroplasty as above Electronically Signed   By: Genevive Bi M.D.   On: 08/31/2022 15:52    Discharge Medications:   Allergies as of 09/01/2022       Reactions   Cat Hair Extract    Stuffy nose itchy eyes        Medication List     TAKE these medications    acetaminophen 650 MG CR tablet Commonly known as: TYLENOL Take 650 mg by mouth every 6 (six) hours as needed for pain.   amLODipine 5 MG tablet Commonly known as: NORVASC Take 1 tablet (5 mg total) by mouth daily.   CALCARB 600 PO Take 1,800 mg by mouth daily.   calcium-vitamin D 500-5 MG-MCG tablet Commonly known as: OSCAL WITH D Take 2 tablets by mouth daily.   celecoxib 100 MG capsule Commonly known as: CELEBREX Take 1 capsule by mouth 2 (two) times daily. What changed: Another medication with the same name was added. Make sure you understand how and when to take each.   celecoxib 200 MG capsule Commonly known as: CELEBREX Take 1 capsule (200 mg total) by mouth 2 (two) times daily. What changed: You were already taking a medication with the same name, and this prescription was added. Make sure you understand how and when to take each.   cholecalciferol 25 MCG (1000 UNIT) tablet Commonly known as: VITAMIN D3 Take 1,000 Units by mouth daily.   enoxaparin 40 MG/0.4ML injection Commonly known as: LOVENOX Inject 0.4 mLs (40 mg total) into the skin daily for 14 days.   losartan 100  MG tablet Commonly known as: COZAAR Take 1 tablet (100 mg total) by mouth daily.   Osteo Bi-Flex/5-Loxin Advanced Tabs Take 2 tablets by mouth daily.   oxyCODONE 5 MG immediate release tablet Commonly known as: Oxy IR/ROXICODONE Take 1 tablet (5 mg total) by mouth every 4 (four) hours as needed for severe pain.   traMADol 50 MG tablet Commonly known as: ULTRAM Take 1 tablet (50 mg total) by mouth every 4 (four) hours as needed for moderate pain.               Durable Medical Equipment  (From admission, onward)           Start     Ordered   08/31/22 1639  DME Walker rolling  Once       Question:  Patient needs a walker to treat with the following condition  Answer:  Total knee replacement status   08/31/22 1638   08/31/22 1639  DME Bedside commode  Once       Question:  Patient needs a bedside commode to treat with the following condition  Answer:  Total knee replacement status   08/31/22 1638  Disposition: Home with home health PT     Follow-up Information     Fausto Skillern, PA-C Follow up on 09/15/2022.   Specialty: Orthopedic Surgery Why: at 2:45pm Contact information: [place] [address] [phone]         Dereck Leep, MD Follow up on 10/15/2022.   Specialty: Orthopedic Surgery Why: at 1:45pm Contact information: [place] [address] [place], PA-C 09/01/2022, 1:19 PM

## 2022-09-01 NOTE — Evaluation (Signed)
Occupational Therapy Evaluation Patient Details Name: Mary Wood MRN: [id number] DOB: April 03, 1956 Today's Date: 09/01/2022   History of Present Illness Pt is a 66 y.o. 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  PMH includes R knee arthroscopy with partial medial meniscectomy and chondroplasty 05/07/2021, htn, shingles, L elbow sx 06/25/2011, L knee arthroscopy/partial medial meniscectomy/and chondroplasty 04/08/22.   Clinical Impression   Patient presenting with decreased independence in self-care, functional mobility, safety, and endurance. Pt in recliner upon arrival and agreeable to OT services. Pt reports she live with her husband, daughter, and grandson in a 2 level home. She plans to sleep on the couch on the main level once discharged home. Pt also reports prior to surgery, she does not use any AE for mobility, but she furniture walks due to the pain in R knee. Pt currently functioning at supervision for transfer sit <> stand using RW, UB dressing, grooming tasks (oral care, washing face), toilet transfers, and clothing manipulation/ peri-care. Pt declined using RW when ambulating back to recliner, required supervision/min guard; gait belt on. Pt's pain was monitored during session, rating 4/10. Pt left in recliner with call bell in reach, bed alarm set, and all needs met. No OT recommendation at this time. OT to complete order.      Recommendations for follow up therapy are one component of a multi-disciplinary discharge planning process, led by the attending physician.  Recommendations may be updated based on patient status, additional functional criteria and insurance authorization.   Follow Up Recommendations  No OT follow up    Assistance Recommended at Discharge Intermittent Supervision/Assistance  Patient can return home with the following A little help with walking and/or transfers;A little help with bathing/dressing/bathroom;Assist for transportation;Assistance  with cooking/housework    Functional Status Assessment  Patient has had a recent decline in their functional status and demonstrates the ability to make significant improvements in function in a reasonable and predictable amount of time.  Equipment Recommendations  BSC/3in1;Other (comment) (Rolling walker)    Recommendations for Other Services       Precautions / Restrictions Precautions Precautions: Fall Precaution Booklet Issued: Yes (comment) Required Braces or Orthoses: Knee Immobilizer - Right Knee Immobilizer - Right: Discontinue once straight leg raise with < 10 degree lag Restrictions Weight Bearing Restrictions: Yes RLE Weight Bearing: Weight bearing as tolerated      Mobility Bed Mobility               General bed mobility comments: Patient in recliner upon arrival.    Transfers Overall transfer level: Needs assistance Equipment used: Rolling walker (2 wheels) Transfers: Sit to/from Stand Sit to Stand: Min guard, Supervision                  Balance Overall balance assessment: Needs assistance Sitting-balance support: Bilateral upper extremity supported, Feet supported Sitting balance-Leahy Scale: Good     Standing balance support: Bilateral upper extremity supported, During functional activity Standing balance-Leahy Scale: Good                             ADL either performed or assessed with clinical judgement   ADL Overall ADL's : Needs assistance/impaired     Grooming: Wash/dry face;Oral care;Wash/dry hands;Supervision/safety           Upper Body Dressing : Supervision/safety     Lower Body Dressing Details (indicate cue type and reason): shorts donned prior to session. Toilet Transfer: Supervision/safety  Toileting- Clothing Manipulation and Hygiene: Supervision/safety       Functional mobility during ADLs: Supervision/safety;Rolling walker (2 wheels) General ADL Comments: Pt used RW to ambulate to bathroom for  grooming task. Pt declined using RW when ambulating back to recliner chair.      Pertinent Vitals/Pain Pain Assessment Pain Score: 3  Pain Location: R knee Pain Descriptors / Indicators: Aching, Tender, Sore, Discomfort Pain Intervention(s): Monitored during session, Repositioned, Premedicated before session     Extremity/Trunk Assessment Upper Extremity Assessment Upper Extremity Assessment: Overall WFL for tasks assessed   Lower Extremity Assessment Lower Extremity Assessment: RLE deficits/detail RLE Deficits / Details: able to perform R LE SLR independently; at least 3/5 AROM hip flexion and ankle DF/PF RLE: Unable to fully assess due to pain   Cervical / Trunk Assessment Cervical / Trunk Assessment: Normal   Communication Communication Communication: No difficulties   Cognition Arousal/Alertness: Awake/alert Behavior During Therapy: WFL for tasks assessed/performed Overall Cognitive Status: Within Functional Limits for tasks assessed                                       General Comments  R LE hemovac and dressings in place            Home Living Family/patient expects to be discharged to:: Private residence Living Arrangements: Spouse/significant other;Children (Daughter and grandchild) Available Help at Discharge: Family Type of Home: House Home Access: Stairs to enter Technical brewer of Steps: 3 Entrance Stairs-Rails: Left Home Layout: Two level Alternate Level Stairs-Number of Steps: 4 stairs 14 to get to the 2nd floor. Alternate Level Stairs-Rails: Left Bathroom Shower/Tub: Occupational psychologist: Standard     Home Equipment: Crutches;Shower seat - built in          Prior Functioning/Environment Prior Level of Function : Independent/Modified Independent             Mobility Comments: Pt held onto furniture as needed in home d/t pain in R knee.                   OT Goals(Current goals can be found in  the care plan section) Acute Rehab OT Goals Patient Stated Goal: to return home. OT Goal Formulation: With patient Time For Goal Achievement: 09/01/22 Potential to Achieve Goals: Good   AM-PAC OT "6 Clicks" Daily Activity     Outcome Measure Help from another person eating meals?: None Help from another person taking care of personal grooming?: A Little Help from another person toileting, which includes using toliet, bedpan, or urinal?: A Little Help from another person bathing (including washing, rinsing, drying)?: A Little Help from another person to put on and taking off regular upper body clothing?: None Help from another person to put on and taking off regular lower body clothing?: A Little 6 Click Score: 20   End of Session Equipment Utilized During Treatment: Gait belt;Rolling walker (2 wheels) Nurse Communication: Mobility status  Activity Tolerance: Patient tolerated treatment well Patient left: in chair;with call bell/phone within reach;with chair alarm set                   Time: 0939-1001 OT Time Calculation (min): 22 min Charges:  OT General Charges $OT Visit: 1 Visit OT Evaluation $OT Eval Low Complexity: 1 Low    Leara Rawl, OTS 09/01/2022, 1:30 PM

## 2022-09-01 NOTE — Care Management Obs Status (Signed)
[facility] NOTIFICATION   Patient Details  Name: Mary Wood MRN: [id number] Date of Birth: 1956/04/02   Medicare Observation Status Notification Given:  Yes    Conception Oms, RN 09/01/2022, 12:11 PM

## 2022-09-01 NOTE — Plan of Care (Signed)

## 2022-09-01 NOTE — Progress Notes (Signed)
Post-op dressing removed. , Hemovac removed., and Mini compression dressing applied.    

## 2022-09-01 NOTE — Evaluation (Signed)
Physical Therapy Evaluation Patient Details Name: Mary Wood MRN: [id number] DOB: 06-25-1956 Today's Date: 09/01/2022  History of Present Illness  Pt is a 66 y.o. 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  PMH includes R knee arthroscopy with partial medial meniscectomy and chondroplasty 05/07/2021, htn, shingles, L elbow sx 06/25/2011, L knee arthroscopy/partial medial meniscectomy/and chondroplasty 04/08/22.  Clinical Impression  Prior to surgery, pt was modified independent with ambulation (held onto furniture as needed within home d/t knee pain); lives with her husband (and daughter and grandson) in 2 level home (plans to stay on couch first few days on main level); no h/o falls.  Currently pt is modified independent supine to sitting edge of bed; CGA with transfers using RW; and CGA ambulating 200 feet with RW use.  R knee pain 5/10 at rest beginning of session; increased to 7/10 with activity; and decreased to 4/10 at rest end of session.  Pt able to perform R LE SLR independently (so no KI utilized).  R knee AROM 0-81 degrees (limited ROM d/t dressing).  Pt would benefit from skilled PT to address noted impairments and functional limitations (see below for any additional details).  Upon hospital discharge, pt would benefit from [place] and support from family.    Recommendations for follow up therapy are one component of a multi-disciplinary discharge planning process, led by the attending physician.  Recommendations may be updated based on patient status, additional functional criteria and insurance authorization.  Follow Up Recommendations Home health PT      Assistance Recommended at Discharge Set up Supervision/Assistance  Patient can return home with the following  A little help with walking and/or transfers;A little help with bathing/dressing/bathroom;Assistance with cooking/housework;Assist for transportation;Help with stairs or ramp for entrance    Equipment  Recommendations Rolling walker (2 wheels);BSC/3in1  Recommendations for Other Services  OT consult    Functional Status Assessment Patient has had a recent decline in their functional status and demonstrates the ability to make significant improvements in function in a reasonable and predictable amount of time.     Precautions / Restrictions Precautions Precautions: Knee;Fall Precaution Booklet Issued: Yes (comment) Required Braces or Orthoses: Knee Immobilizer - Right Knee Immobilizer - Right: Discontinue once straight leg raise with < 10 degree lag Restrictions Weight Bearing Restrictions: Yes RLE Weight Bearing: Weight bearing as tolerated      Mobility  Bed Mobility Overal bed mobility: Modified Independent             General bed mobility comments: Supine to sitting edge of bed with increased effort/time to perform on own    Transfers Overall transfer level: Needs assistance Equipment used: Rolling walker (2 wheels) Transfers: Sit to/from Stand Sit to Stand: Min guard           General transfer comment: pt unable to stand 1st attempt from bed but with bed height mildly elevated pt able to stand with CGA; vc's for UE/LE placement and overall technique    Ambulation/Gait Ambulation/Gait assistance: Min guard Gait Distance (Feet): 200 Feet Assistive device: Rolling walker (2 wheels)   Gait velocity: mildly decreased     General Gait Details: step to progressing to partial step through; initial vc's for overall gait technique and walker use; initial vc's for R heel strike and R knee flexion during R LE swing phase; steady  Stairs            Wheelchair Mobility    Modified Rankin (Stroke Patients Only)  Balance Overall balance assessment: Needs assistance Sitting-balance support: No upper extremity supported, Feet supported Sitting balance-Leahy Scale: Normal Sitting balance - Comments: steady sitting reaching outside BOS   Standing  balance support: Bilateral upper extremity supported, During functional activity, Reliant on assistive device for balance Standing balance-Leahy Scale: Good Standing balance comment: steady ambulating with RW use                             Pertinent Vitals/Pain Pain Assessment Pain Assessment: 0-10 Pain Score: 4  Pain Location: R knee Pain Descriptors / Indicators: Aching, Tender, Sore, Operative site guarding Pain Intervention(s): Limited activity within patient's tolerance, Monitored during session, Repositioned, RN gave pain meds during session, Other (comment) (polar care applied and activated) Vitals (HR and O2 on room air) stable and WFL throughout treatment session.    Home Living Family/patient expects to be discharged to:: Private residence Living Arrangements: Spouse/significant other;Children (pt's husband, daughter, and grandson) Available Help at Discharge: Family Type of Home: House Home Access: Stairs to enter Entrance Stairs-Rails: Left Entrance Stairs-Number of Steps: 4 Alternate Level Stairs-Number of Steps: 14 Home Layout: Two level (plans to stay on main level and sleep on couch for first few days) Home Equipment: Crutches;Shower seat - built in      Prior Function Prior Level of Function : Independent/Modified Independent             Mobility Comments: Pt held onto furniture as needed in home d/t pain in R knee.       Hand Dominance        Extremity/Trunk Assessment   Upper Extremity Assessment Upper Extremity Assessment: Defer to OT evaluation;Overall WFL for tasks assessed    Lower Extremity Assessment Lower Extremity Assessment: RLE deficits/detail (L LE WFL) RLE Deficits / Details: able to perform R LE SLR independently; at least 3/5 AROM hip flexion and ankle DF/PF RLE: Unable to fully assess due to pain    Cervical / Trunk Assessment Cervical / Trunk Assessment: Normal  Communication   Communication: No difficulties   Cognition Arousal/Alertness: Awake/alert Behavior During Therapy: WFL for tasks assessed/performed Overall Cognitive Status: Within Functional Limits for tasks assessed                                          General Comments General comments (skin integrity, edema, etc.): R LE hemovac and dressings in place.  Nursing cleared pt for participation in physical therapy.  Pt agreeable to PT session.    Exercises Total Joint Exercises Ankle Circles/Pumps: AROM, Strengthening, Both, 10 reps, Supine Quad Sets: AROM, Strengthening, Both, 10 reps, Supine Short Arc Quad: AROM, Strengthening, Right, 10 reps, Supine Heel Slides: AAROM, Strengthening, Right, 10 reps, Supine Hip ABduction/ADduction: AAROM, Strengthening, Right, 10 reps, Supine Straight Leg Raises: AROM, Strengthening, Right, 10 reps, Supine Long Arc Quad: AROM, Strengthening, Right, 10 reps, Seated Knee Flexion: AROM, Strengthening, Right, 10 reps, Seated (foot on washcloth to improve ease of sliding foot on floor) Goniometric ROM: R knee AROM 0-81 degrees   Assessment/Plan    PT Assessment Patient needs continued PT services  PT Problem List Decreased strength;Decreased range of motion;Decreased activity tolerance;Decreased balance;Decreased mobility;Decreased knowledge of use of DME;Decreased knowledge of precautions;Pain;Decreased skin integrity       PT Treatment Interventions DME instruction;Gait training;Stair training;Functional mobility training;Therapeutic activities;Therapeutic exercise;Balance training;Patient/family education  PT Goals (Current goals can be found in the Care Plan section)  Acute Rehab PT Goals Patient Stated Goal: to go home today PT Goal Formulation: With patient Time For Goal Achievement: 09/15/22 Potential to Achieve Goals: Good    Frequency BID     Co-evaluation               AM-PAC PT "6 Clicks" Mobility  Outcome Measure Help needed turning from your back to  your side while in a flat bed without using bedrails?: None Help needed moving from lying on your back to sitting on the side of a flat bed without using bedrails?: None Help needed moving to and from a bed to a chair (including a wheelchair)?: A Little Help needed standing up from a chair using your arms (e.g., wheelchair or bedside chair)?: A Little Help needed to walk in hospital room?: A Little Help needed climbing 3-5 steps with a railing? : A Little 6 Click Score: 20    End of Session Equipment Utilized During Treatment: Gait belt Activity Tolerance: Patient tolerated treatment well Patient left: in chair;with call bell/phone within reach;with chair alarm set;with SCD's reapplied;Other (comment) (R heel floating via towel roll;  polar care in place) Nurse Communication: Mobility status;Precautions;Weight bearing status PT Visit Diagnosis: Other abnormalities of gait and mobility (R26.89);Muscle weakness (generalized) (M62.81);Pain Pain - Right/Left: Right Pain - part of body: Knee    Time: 2633-3545 PT Time Calculation (min) (ACUTE ONLY): 48 min   Charges:   PT Evaluation $PT Eval Low Complexity: 1 Low PT Treatments $Gait Training: 8-22 mins $Therapeutic Exercise: 8-22 mins       Hendricks Limes, PT 09/01/22, 10:06 AM

## 2022-09-01 NOTE — Progress Notes (Signed)
  Subjective: 1 Day Post-Op Procedure(s) (LRB): COMPUTER ASSISTED TOTAL KNEE ARTHROPLASTY (Right) Physical therapy in room with patient. Patient reports pain as well-controlled.   Patient is well, and has had no acute complaints or problems Plan is to go Home after hospital stay. Negative for chest pain and shortness of breath Fever: no Gastrointestinal: negative for nausea and vomiting.   Patient has not had a bowel movement.  Objective: Vital signs in last 24 hours: Temp:  [97 F (36.1 C)-98.8 F (37.1 C)] 98.8 F (37.1 C) (09/19 0545) Pulse Rate:  [73-83] 74 (09/19 0545) Resp:  [12-21] 18 (09/19 0545) BP: (105-145)/(61-81) 105/61 (09/19 0545) SpO2:  [95 %-100 %] 97 % (09/19 0545) Weight:  [90.7 kg] 90.7 kg (09/18 0943)  Intake/Output from previous day:  Intake/Output Summary (Last 24 hours) at 09/01/2022 0858 Last data filed at 09/01/2022 0651 Gross per 24 hour  Intake 1540.69 ml  Output 1130 ml  Net 410.69 ml    Intake/Output this shift: No intake/output data recorded.  Labs: No results for input(s): "HGB" in the last 72 hours. No results for input(s): "WBC", "RBC", "HCT", "PLT" in the last 72 hours. No results for input(s): "NA", "K", "CL", "CO2", "BUN", "CREATININE", "GLUCOSE", "CALCIUM" in the last 72 hours. No results for input(s): "LABPT", "INR" in the last 72 hours.   EXAM General - Patient is Alert, Appropriate, and Oriented Extremity - Neurovascular intact Dorsiflexion/Plantar flexion intact Compartment soft Dressing/Incision -Postoperative dressing remains in place., Polar Care in place and working. , Hemovac in place.  Motor Function - intact, moving foot and toes well on exam. Able to perform independent SLR.  Cardiovascular- Regular rate and rhythm, no murmurs/rubs/gallops Respiratory- Lungs clear to auscultation bilaterally Gastrointestinal- soft, nontender, and active bowel sounds   Assessment/Plan: 1 Day Post-Op Procedure(s) (LRB): COMPUTER  ASSISTED TOTAL KNEE ARTHROPLASTY (Right) Principal Problem:   Total knee replacement status  Estimated body mass index is 31.32 kg/m as calculated from the following:   Height as of this encounter: 5\' 7"  (1.702 m).   Weight as of this encounter: 90.7 kg. Advance diet Up with therapy  Likely d/c this PM.     DVT Prophylaxis - Lovenox, Ted hose, and SCDs Weight-Bearing as tolerated to right leg  Cassell Smiles, PA-C [facility] 09/01/2022, 8:58 AM

## 2022-09-01 NOTE — Anesthesia Postprocedure Evaluation (Signed)
Anesthesia Post Note  Patient: Mary Wood  Procedure(s) Performed: COMPUTER ASSISTED TOTAL KNEE ARTHROPLASTY (Right: Knee)  Patient location during evaluation: Nursing Unit Anesthesia Type: Spinal Level of consciousness: awake, awake and alert, sedated and patient cooperative Pain management: pain level controlled Vital Signs Assessment: post-procedure vital signs reviewed and stable Respiratory status: spontaneous breathing, nonlabored ventilation and respiratory function stable Cardiovascular status: stable Postop Assessment: no headache, no backache, no apparent nausea or vomiting, patient able to bend at knees, adequate PO intake and able to ambulate Anesthetic complications: no   No notable events documented.   Last Vitals:  Vitals:   09/01/22 0545 09/01/22 0907  BP: 105/61 130/71  Pulse: 74 74  Resp: 18   Temp: 37.1 C 36.6 C  SpO2: 97% 99%    Last Pain:  Vitals:   09/01/22 1309  TempSrc:   PainSc: 6                  Jobanny Mavis,  Armondo Cech R

## 2022-09-01 NOTE — TOC CM/SW Note (Cosign Needed)
Patient is not able to walk the distance required to go the bathroom, or he/she is unable to safely negotiate stairs required to access the bathroom.  A 3in1 BSC will alleviate this problem  

## 2022-09-01 NOTE — Progress Notes (Signed)
Physical Therapy Treatment Patient Details Name: Mary Wood MRN: [id number] DOB: [birth year] Today's Date: 09/01/2022   History of Present Illness Pt is a 66 y.o. 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  PMH includes R knee arthroscopy with partial medial meniscectomy and chondroplasty 05/07/2021, htn, shingles, L elbow sx 06/25/2011, L knee arthroscopy/partial medial meniscectomy/and chondroplasty 04/08/22.    PT Comments    Pt resting in bed upon PT arrival; pt agreeable to PT session.  Pain in R knee 5/10 at rest beginning of session and 6/10 at rest end of session (pt reports recent pain meds).  During session pt modified independent with bed mobility; CGA to SBA with transfers using RW; SBA with ambulation 60 feet with RW use (deferred further ambulation d/t main focus on stairs during session); and CGA navigating 8 stairs with B UE support.  R knee AROM 0-90 degrees.  Reviewed home safety and safe car transfers: pt verbalizing appropriate understanding.  RW (for home use) set up for safe use.  Pt appears appropriate for home discharge today (PA notified).    Recommendations for follow up therapy are one component of a multi-disciplinary discharge planning process, led by the attending physician.  Recommendations may be updated based on patient status, additional functional criteria and insurance authorization.  Follow Up Recommendations  Home health PT     Assistance Recommended at Discharge Set up Supervision/Assistance  Patient can return home with the following A little help with walking and/or transfers;A little help with bathing/dressing/bathroom;Assistance with cooking/housework;Assist for transportation;Help with stairs or ramp for entrance   Equipment Recommendations  Rolling walker (2 wheels);BSC/3in1    Recommendations for Other Services OT consult     Precautions / Restrictions Precautions Precautions: Fall Precaution Booklet Issued: Yes  (comment) Required Braces or Orthoses: Knee Immobilizer - Right Knee Immobilizer - Right: Discontinue once straight leg raise with < 10 degree lag Restrictions Weight Bearing Restrictions: Yes RLE Weight Bearing: Weight bearing as tolerated     Mobility  Bed Mobility Overal bed mobility: Modified Independent             General bed mobility comments: Supine to/from sitting edge of bed with mild increased effort to perform on own    Transfers Overall transfer level: Needs assistance Equipment used: Rolling walker (2 wheels) Transfers: Sit to/from Stand Sit to Stand: Min guard, Supervision           General transfer comment: CGA to stand from bed (bed height mildly elevated) and from mat table (simulating height of couch at home); steady transfers with RW use    Ambulation/Gait Ambulation/Gait assistance: Supervision Gait Distance (Feet):  (60) Assistive device: Rolling walker (2 wheels)   Gait velocity: mildly decreased     General Gait Details: step to progressing to partial step through gait pattern; steady with RW use   Stairs Stairs: Yes Stairs assistance: Min guard Stair Management: Two rails, Step to pattern, Forwards Number of Stairs: 8 General stair comments: ascended/descended 4 stairs with B railings and then ascended/descend 4 steps with L railing and R UE support (simulating home set up with sturdy support on R); initial vc's LE sequencing and overall technique   Wheelchair Mobility    Modified Rankin (Stroke Patients Only)       Balance Overall balance assessment: Needs assistance Sitting-balance support: No upper extremity supported, Feet supported Sitting balance-Leahy Scale: Normal Sitting balance - Comments: steady sitting reaching outside BOS   Standing balance support: Bilateral upper extremity supported,  During functional activity, Reliant on assistive device for balance Standing balance-Leahy Scale: Good Standing balance comment:  steady ambulating with RW use                            Cognition Arousal/Alertness: Awake/alert Behavior During Therapy: WFL for tasks assessed/performed Overall Cognitive Status: Within Functional Limits for tasks assessed                                          Exercises Goniometric ROM: R knee AROM 0-90 degrees    General Comments General comments (skin integrity, edema, etc.): scant drainage noted R knee honeycomb dressing      Pertinent Vitals/Pain Pain Assessment Pain Assessment: 0-10 Pain Score: 6  Pain Location: R knee Pain Descriptors / Indicators: Aching, Tender, Sore, Discomfort Pain Intervention(s): Limited activity within patient's tolerance, Monitored during session, Premedicated before session, Other (comment) (polar care applied)    Home Living      Prior Function            PT Goals (current goals can now be found in the care plan section) Acute Rehab PT Goals Patient Stated Goal: to go home today PT Goal Formulation: With patient Time For Goal Achievement: 09/15/22 Potential to Achieve Goals: Good Progress towards PT goals: Progressing toward goals    Frequency    BID      PT Plan Current plan remains appropriate    Co-evaluation              AM-PAC PT "6 Clicks" Mobility   Outcome Measure  Help needed turning from your back to your side while in a flat bed without using bedrails?: None Help needed moving from lying on your back to sitting on the side of a flat bed without using bedrails?: None Help needed moving to and from a bed to a chair (including a wheelchair)?: A Little Help needed standing up from a chair using your arms (e.g., wheelchair or bedside chair)?: A Little Help needed to walk in hospital room?: A Little Help needed climbing 3-5 steps with a railing? : A Little 6 Click Score: 20    End of Session Equipment Utilized During Treatment: Gait belt Activity Tolerance: Patient tolerated  treatment well Patient left: in bed;with call bell/phone within reach;with bed alarm set;Other (comment) (polar care in place) Nurse Communication: Mobility status;Precautions;Weight bearing status PT Visit Diagnosis: Other abnormalities of gait and mobility (R26.89);Muscle weakness (generalized) (M62.81);Pain Pain - Right/Left: Right Pain - part of body: Knee     Time: 1025-8527 PT Time Calculation (min) (ACUTE ONLY): 25 min  Charges:  $Gait Training: 8-22 mins $Therapeutic Activity: 8-22 mins                     Leitha Bleak, PT 09/01/22, 3:03 PM

## 2022-09-15 DIAGNOSIS — Z96651 Presence of right artificial knee joint: Secondary | ICD-10-CM | POA: Diagnosis not present

## 2022-09-15 DIAGNOSIS — M25561 Pain in right knee: Secondary | ICD-10-CM | POA: Diagnosis not present

## 2022-09-17 DIAGNOSIS — M25561 Pain in right knee: Secondary | ICD-10-CM | POA: Diagnosis not present

## 2022-09-17 DIAGNOSIS — Z96651 Presence of right artificial knee joint: Secondary | ICD-10-CM | POA: Diagnosis not present

## 2022-09-22 DIAGNOSIS — M25561 Pain in right knee: Secondary | ICD-10-CM | POA: Diagnosis not present

## 2022-09-22 DIAGNOSIS — Z96651 Presence of right artificial knee joint: Secondary | ICD-10-CM | POA: Diagnosis not present

## 2022-09-24 DIAGNOSIS — Z96651 Presence of right artificial knee joint: Secondary | ICD-10-CM | POA: Diagnosis not present

## 2022-09-24 DIAGNOSIS — M25561 Pain in right knee: Secondary | ICD-10-CM | POA: Diagnosis not present

## 2022-09-29 DIAGNOSIS — M25561 Pain in right knee: Secondary | ICD-10-CM | POA: Diagnosis not present

## 2022-09-29 DIAGNOSIS — Z96651 Presence of right artificial knee joint: Secondary | ICD-10-CM | POA: Diagnosis not present

## 2022-10-01 DIAGNOSIS — Z96651 Presence of right artificial knee joint: Secondary | ICD-10-CM | POA: Diagnosis not present

## 2022-10-01 DIAGNOSIS — M25561 Pain in right knee: Secondary | ICD-10-CM | POA: Diagnosis not present

## 2022-10-06 DIAGNOSIS — M810 Age-related osteoporosis without current pathological fracture: Secondary | ICD-10-CM | POA: Diagnosis not present

## 2022-10-06 DIAGNOSIS — E669 Obesity, unspecified: Secondary | ICD-10-CM | POA: Diagnosis not present

## 2022-10-06 DIAGNOSIS — I10 Essential (primary) hypertension: Secondary | ICD-10-CM | POA: Diagnosis not present

## 2022-10-06 DIAGNOSIS — Z9181 History of falling: Secondary | ICD-10-CM | POA: Diagnosis not present

## 2022-10-06 DIAGNOSIS — Z6831 Body mass index (BMI) 31.0-31.9, adult: Secondary | ICD-10-CM | POA: Diagnosis not present

## 2022-10-06 DIAGNOSIS — Z96651 Presence of right artificial knee joint: Secondary | ICD-10-CM | POA: Diagnosis not present

## 2022-10-06 DIAGNOSIS — M5136 Other intervertebral disc degeneration, lumbar region: Secondary | ICD-10-CM | POA: Diagnosis not present

## 2022-10-06 DIAGNOSIS — M25561 Pain in right knee: Secondary | ICD-10-CM | POA: Diagnosis not present

## 2022-10-06 DIAGNOSIS — Z471 Aftercare following joint replacement surgery: Secondary | ICD-10-CM | POA: Diagnosis not present

## 2022-10-06 DIAGNOSIS — Z791 Long term (current) use of non-steroidal anti-inflammatories (NSAID): Secondary | ICD-10-CM | POA: Diagnosis not present

## 2022-10-08 DIAGNOSIS — Z96651 Presence of right artificial knee joint: Secondary | ICD-10-CM | POA: Diagnosis not present

## 2022-10-08 DIAGNOSIS — M25561 Pain in right knee: Secondary | ICD-10-CM | POA: Diagnosis not present

## 2022-10-15 DIAGNOSIS — Z96651 Presence of right artificial knee joint: Secondary | ICD-10-CM | POA: Diagnosis not present

## 2022-10-19 ENCOUNTER — Encounter: Payer: Self-pay | Admitting: Nurse Practitioner

## 2022-10-19 MED ORDER — AMLODIPINE BESYLATE 5 MG PO TABS: 5.0000 mg | ORAL_TABLET | Freq: Every day | ORAL | 4 refills | Status: DC

## 2022-10-19 MED ORDER — LOSARTAN POTASSIUM 100 MG PO TABS: 100.0000 mg | ORAL_TABLET | Freq: Every day | ORAL | 4 refills | Status: DC

## 2023-01-08 ENCOUNTER — Encounter: Payer: Self-pay | Admitting: Nurse Practitioner

## 2023-01-11 ENCOUNTER — Ambulatory Visit: Payer: Self-pay

## 2023-01-11 NOTE — Telephone Encounter (Signed)
Reason for Disposition  [3] Systolic BP  >= 009 OR Diastolic >= 80 AND [2] taking BP medications  Answer Assessment - Initial Assessment Questions 1. BLOOD PRESSURE: "What is the blood pressure?" "Did you take at least two measurements 5 minutes apart?"     144/83 2. ONSET: "When did you take your blood pressure?"     Just now 3. HOW: "How did you take your blood pressure?" (e.g., automatic home BP monitor, visiting nurse)     automatic 4. HISTORY: "Do you have a history of high blood pressure?"     yes 5. MEDICINES: "Are you taking any medicines for blood pressure?" "Have you missed any doses recently?"     Yes - 2 medicines 6. OTHER SYMPTOMS: "Do you have any symptoms?" (e.g., blurred vision, chest pain, difficulty breathing, headache, weakness)     Last night  - HA, Dizziness, sweating 7. PREGNANCY: "Is there any chance you are pregnant?" "When was your last menstrual period?"  Protocols used: Blood Pressure - High-A-AH

## 2023-01-11 NOTE — Telephone Encounter (Signed)
  Chief Complaint: Elevated BP mild dizziness Symptoms: above Frequency: Dizziness 2 weeks ago, and again yesterday and today. Bp's yesterday 138/108 which went down. Today 144/83. Pertinent Negatives: Patient denies  Disposition: [] ED /[] Urgent Care (no appt availability in office) / [x] Appointment(In office/virtual)/ []  [facility]/ [] Home Care/ [] Refused Recommended Disposition /[] [place]/ []  Follow-up with PCP Additional Notes: Pt states that she had a bit of dizziness about 2 weeks ago that resolved. Yesterday and today she has had dizziness again. Mild. And she reports elevated BP's Pt has not taken her BP medication yet today. Unsure if dizziness and BP are related.  Please advise. Pt may be called or messaged through [facility].

## 2023-01-12 ENCOUNTER — Encounter: Payer: Self-pay | Admitting: Nurse Practitioner

## 2023-01-12 ENCOUNTER — Ambulatory Visit: Payer: Medicare PPO | Admitting: Nurse Practitioner

## 2023-01-12 VITALS — BP 126/71 | HR 83 | Temp 97.8°F | Ht 66.26 in | Wt 207.3 lb

## 2023-01-12 DIAGNOSIS — I10 Essential (primary) hypertension: Secondary | ICD-10-CM | POA: Diagnosis not present

## 2023-01-12 DIAGNOSIS — R42 Dizziness and giddiness: Secondary | ICD-10-CM | POA: Insufficient documentation

## 2023-01-12 MED ORDER — PREDNISONE 20 MG PO TABS: 40.0000 mg | ORAL_TABLET | Freq: Every day | ORAL | 0 refills | Status: AC

## 2023-01-12 NOTE — Assessment & Plan Note (Signed)
Chronic, stable with BP at goal in office. Will continue current medication regimen and adjust as needed. Continue to monitor BP at home regularly and focus on DASH diet.

## 2023-01-12 NOTE — Assessment & Plan Note (Addendum)
Suspect dizziness related to recent travel and fluid/irritation in ears on physical exam. Discussed refraining from activities that make ears prone to fluid accumulation such as swimming, taking baths. Will send in Prednisone 40 MG daily for 5 days for eustachian tube dysfunction. Pt has physical scheduled 2/15 and will f/u on dizziness at that time.

## 2023-01-12 NOTE — Progress Notes (Signed)
BP 126/71   Pulse 83   Temp 97.8 F (36.6 C) (Oral)   Ht 5' 6.26" (1.683 m)   Wt 207 lb 4.8 oz (94 kg)   LMP  (LMP Unknown)   SpO2 95%   BMI 33.20 kg/m    Subjective:    Patient ID: Mary Wood, female    DOB: 1956-03-23, 67 y.o.   MRN: [id number]  HPI: Mary Wood is a 67 y.o. female  Chief Complaint  Patient presents with   Hypertension   Dizziness   NOTE WRITTEN BY DNP STUDENT.  ASSESSMENT AND PLAN OF CARE REVIEWED WITH STUDENT, AGREE WITH ABOVE FINDINGS AND PLAN.  HYPERTENSION  Hypertension status: stable  Satisfied with current treatment? yes Duration of hypertension: years BP monitoring frequency:  a few times a month BP range:  BP medication side effects:  no Medication compliance: excellent compliance Aspirin: no Recurrent headaches: no Visual changes: no Palpitations: no Dyspnea: no Chest pain: no Lower extremity edema: no Dizzy/lightheaded: yes   DIZZINESS Pt presents with elevated BP at home and dizziness. Pt reports dizziness that started last week and has been intermittent but does report that today it is better. She describes the dizziness as something she woke up with last week but it has since happened randomly. Pt also reports associated diaphoresis. Pt reports highest BP reading she has seen at home is 138/108 but came down to 140/80. Pt reports lowest BP reading has been 114/70.  Pt reports that she has dizziness even when lying down and it worsens when she moves her head from side to side or turns over in bed. Pt describes her dizziness as off-kilter.   Pt also reports going to [place] last week and getting in the ocean but thinks she got all of the water our of her ears. She denies ear pain, sinus pain or any recent viral illnesses.   Duration: days Description of symptoms: off kilter Duration of episode: hours Dizziness frequency: recurrent Provoking factors:  turning head side to side Aggravating factors:   turning head side to  side Triggered by rolling over in bed: yes Triggered by bending over: yes Aggravated by head movement: yes Aggravated by exertion, coughing, loud noises: no Recent head injury: no Recent or current viral symptoms: no History of vasovagal episodes: no Nausea: no Vomiting: no Tinnitus: no Hearing loss: no Aural fullness: no Headache: no Photophobia/phonophobia: no Unsteady gait: no Postural instability: no Diplopia, dysarthria, dysphagia or weakness: no Related to exertion: no Pallor: no Diaphoresis: yes Dyspnea: no Chest pain: no   Relevant past medical, surgical, family and social history reviewed and updated as indicated. Interim medical history since our last visit reviewed. Allergies and medications reviewed and updated.  Review of Systems  Constitutional:  Negative for activity change, fatigue and fever.  HENT:  Negative for congestion, dental problem, ear discharge, ear pain, hearing loss, rhinorrhea, sinus pressure and sinus pain.   Respiratory:  Negative for cough, chest tightness and shortness of breath.   Cardiovascular:  Negative for chest pain, palpitations and leg swelling.  Neurological:  Positive for dizziness and light-headedness. Negative for syncope, facial asymmetry, speech difficulty, weakness and numbness.    Per HPI unless specifically indicated above     Objective:    BP 126/71   Pulse 83   Temp 97.8 F (36.6 C) (Oral)   Ht 5' 6.26" (1.683 m)   Wt 207 lb 4.8 oz (94 kg)   LMP  (LMP Unknown)  SpO2 95%   BMI 33.20 kg/m   Wt Readings from Last 3 Encounters:  01/12/23 207 lb 4.8 oz (94 kg)  08/31/22 200 lb (90.7 kg)  08/24/22 205 lb (93 kg)    Physical Exam Constitutional:      General: She is not in acute distress.    Appearance: Normal appearance. She is normal weight. She is not ill-appearing or toxic-appearing.  HENT:     Head: Normocephalic and atraumatic.     Right Ear: Tympanic membrane normal.     Nose: Nose normal.      Mouth/Throat:     Mouth: Mucous membranes are moist.  Eyes:     Conjunctiva/sclera: Conjunctivae normal.  Neck:     Vascular: No carotid bruit.  Cardiovascular:     Rate and Rhythm: Normal rate and regular rhythm.     Pulses: Normal pulses.     Heart sounds: Normal heart sounds. No murmur heard.    No gallop.  Pulmonary:     Effort: Pulmonary effort is normal. No respiratory distress.     Breath sounds: Normal breath sounds.  Abdominal:     Palpations: Abdomen is soft.  Musculoskeletal:        General: Normal range of motion.     Cervical back: Neck supple.  Lymphadenopathy:     Cervical: No cervical adenopathy.  Neurological:     General: No focal deficit present.     Mental Status: She is alert and oriented to person, place, and time. Mental status is at baseline.  Psychiatric:        Mood and Affect: Mood normal.        Behavior: Behavior normal.        Thought Content: Thought content normal.        Judgment: Judgment normal.    Results for orders placed or performed during the hospital encounter of 08/31/22  ABO/Rh  Result Value Ref Range   ABO/RH(D)      A POS Performed at [facility], [address] [place], [place] [address]       Assessment & Plan:   Problem List Items Addressed This Visit       Cardiovascular and Mediastinum   Hypertension - Primary    Chronic, stable with BP at goal in office. Will continue current medication regimen and adjust as needed. Continue to monitor BP at home regularly and focus on DASH diet.         Other   Dizziness    Suspect dizziness related to recent travel and fluid/irritation in ears on physical exam. Discussed refraining from activities that make ears prone to fluid accumulation such as swimming, taking baths. Will send in Prednisone 40 MG daily for 5 days for eustachian tube dysfunction. Pt has physical scheduled 2/15 and will f/u on dizziness at that time.         Follow up plan: Return for as  scheduled February 15th.

## 2023-01-12 NOTE — Patient Instructions (Signed)
Eustachian Tube Dysfunction  Eustachian tube dysfunction refers to a condition in which a blockage develops in the narrow passage that connects the middle ear to the back of the nose (eustachian tube). The eustachian tube regulates air pressure in the middle ear by letting air move between the ear and nose. It also helps to drain fluid from the middle ear space. Eustachian tube dysfunction can affect one or both ears. When the eustachian tube does not function properly, air pressure, fluid, or both can build up in the middle ear. What are the causes? This condition occurs when the eustachian tube becomes blocked or cannot open normally. Common causes of this condition include: Ear infections. Colds and other infections that affect the nose, mouth, and throat (upper respiratory tract). Allergies. Irritation from cigarette smoke. Irritation from stomach acid coming up into the esophagus (gastroesophageal reflux). The esophagus is the part of the body that moves food from the mouth to the stomach. Sudden changes in air pressure, such as from descending in an airplane or scuba diving. Abnormal growths in the nose or throat, such as: Growths that line the nose (nasal polyps). Abnormal growth of cells (tumors). Enlarged tissue at the back of the throat (adenoids). What increases the risk? You are more likely to develop this condition if: You smoke. You are overweight. You are a child who has: Certain birth defects of the mouth, such as cleft palate. Large tonsils or adenoids. What are the signs or symptoms? Common symptoms of this condition include: A feeling of fullness in the ear. Ear pain. Clicking or popping noises in the ear. Ringing in the ear (tinnitus). Hearing loss. Loss of balance. Dizziness. Symptoms may get worse when the air pressure around you changes, such as when you travel to an area of high elevation, fly on an airplane, or go scuba diving. How is this diagnosed? This  condition may be diagnosed based on: Your symptoms. A physical exam of your ears, nose, and throat. Tests, such as those that measure: The movement of your eardrum. Your hearing (audiometry). How is this treated? Treatment depends on the cause and severity of your condition. In mild cases, you may relieve your symptoms by moving air into your ears. This is called "popping the ears." In more severe cases, or if you have symptoms of fluid in your ears, treatment may include: Medicines to relieve congestion (decongestants). Medicines that treat allergies (antihistamines). Nasal sprays or ear drops that contain medicines that reduce swelling (steroids). A procedure to drain the fluid in your eardrum. In this procedure, a small tube may be placed in the eardrum to: Drain the fluid. Restore the air in the middle ear space. A procedure to insert a balloon device through the nose to inflate the opening of the eustachian tube (balloon dilation). Follow these instructions at home: Lifestyle Do not do any of the following until your health care provider approves: Travel to high altitudes. Fly in airplanes. Work in a pressurized cabin or room. Scuba dive. Do not use any products that contain nicotine or tobacco. These products include cigarettes, chewing tobacco, and vaping devices, such as e-cigarettes. If you need help quitting, ask your health care provider. Keep your ears dry. Wear fitted earplugs during showering and bathing. Dry your ears completely after. General instructions Take over-the-counter and prescription medicines only as told by your health care provider. Use techniques to help pop your ears as recommended by your health care provider. These may include: Chewing gum. Yawning. Frequent, forceful swallowing.   Closing your mouth, holding your nose closed, and gently blowing as if you are trying to blow air out of your nose. Keep all follow-up visits. This is important. Contact a  health care provider if: Your symptoms do not go away after treatment. Your symptoms come back after treatment. You are unable to pop your ears. You have: A fever. Pain in your ear. Pain in your head or neck. Fluid draining from your ear. Your hearing suddenly changes. You become very dizzy. You lose your balance. Get help right away if: You have a sudden, severe increase in any of your symptoms. Summary Eustachian tube dysfunction refers to a condition in which a blockage develops in the eustachian tube. It can be caused by ear infections, allergies, inhaled irritants, or abnormal growths in the nose or throat. Symptoms may include ear pain or fullness, hearing loss, or ringing in the ears. Mild cases are treated with techniques to unblock the ears, such as yawning or chewing gum. More severe cases are treated with medicines or procedures. This information is not intended to replace advice given to you by your health care provider. Make sure you discuss any questions you have with your health care provider. Document Revised: 02/10/2021 Document Reviewed: 02/10/2021 Elsevier Patient Education  2023 [employer]  

## 2023-01-18 ENCOUNTER — Telehealth: Payer: Self-pay | Admitting: Nurse Practitioner

## 2023-01-18 NOTE — Telephone Encounter (Signed)
Copied from [place]. Topic: Medicare AWV >> Jan 18, 2023  2:45 PM Devoria Glassing wrote: Reason for CRM: Left message for patient to schedule Annual Wellness Visit.  Please schedule with Health Nurse Advisor Kirke Shaggy at [facility].Call Gisela at [phone]

## 2023-01-24 NOTE — Patient Instructions (Signed)

## 2023-01-28 ENCOUNTER — Ambulatory Visit (INDEPENDENT_AMBULATORY_CARE_PROVIDER_SITE_OTHER): Payer: Medicare PPO | Admitting: Nurse Practitioner

## 2023-01-28 ENCOUNTER — Encounter: Payer: Self-pay | Admitting: Nurse Practitioner

## 2023-01-28 VITALS — BP 133/81 | HR 78 | Temp 97.7°F | Ht 66.26 in | Wt 211.0 lb

## 2023-01-28 DIAGNOSIS — E7800 Pure hypercholesterolemia, unspecified: Secondary | ICD-10-CM | POA: Diagnosis not present

## 2023-01-28 DIAGNOSIS — I10 Essential (primary) hypertension: Secondary | ICD-10-CM

## 2023-01-28 DIAGNOSIS — M8588 Other specified disorders of bone density and structure, other site: Secondary | ICD-10-CM

## 2023-01-28 DIAGNOSIS — Z0000 Encounter for general adult medical examination without abnormal findings: Secondary | ICD-10-CM

## 2023-01-28 DIAGNOSIS — Z800 Family history of malignant neoplasm of digestive organs: Secondary | ICD-10-CM

## 2023-01-28 DIAGNOSIS — E6609 Other obesity due to excess calories: Secondary | ICD-10-CM

## 2023-01-28 DIAGNOSIS — Z1231 Encounter for screening mammogram for malignant neoplasm of breast: Secondary | ICD-10-CM

## 2023-01-28 NOTE — Progress Notes (Signed)
BP 133/81   Pulse 78   Temp 97.7 F (36.5 C) (Oral)   Ht 5' 6.26" (1.683 m)   Wt 211 lb (95.7 kg)   LMP  (LMP Unknown)   SpO2 95%   BMI 33.79 kg/m    Subjective:    Patient ID: Mary Wood, female    DOB: December 25, 1955, 67 y.o.   MRN: [id number]  HPI: Mary Wood is a 67 y.o. female presenting on 01/28/2023 for comprehensive medical examination + Medicare Wellness exam. Current medical complaints include:none  She currently lives with: husband Menopausal Symptoms: no   HYPERTENSION Continues on Losartan 100 MG and Amlodipine 5 MG daily. Started going to gym this week and participating in [employer]. Hypertension status: controlled  Satisfied with current treatment? yes Duration of hypertension: chronic BP monitoring frequency:  rarely BP range: <130/80 range BP medication side effects:  no Medication compliance: good compliance Aspirin: no Recurrent headaches: no  Visual changes: no Palpitations: no Dyspnea: no Chest pain: no Lower extremity edema: no Dizzy/lightheaded: no  The 10-year ASCVD risk score (Arnett DK, et al., 2019) is: 9.9%   Values used to calculate the score:     Age: 58 years     Sex: Female     Is Non-Hispanic African American: No     Diabetic: No     Tobacco smoker: No     Systolic Blood Pressure: Q000111Q mmHg     Is BP treated: Yes     HDL Cholesterol: 64 mg/dL     Total Cholesterol: 171 mg/dL  OSTEOPENIA Taking daily supplements with last DEXA 04/14/21 with The BMD measured at [facility] L1-L4 (L3) is 0.945 g/cm2 with a T-score of -1.9.  Satisfied with current treatment?: yes Adequate calcium & vitamin D: yes Weight bearing exercises: yes   Depression Screen done today and results listed below:     01/28/2023    8:53 AM 01/12/2023    2:33 PM 07/27/2022    8:35 AM 01/27/2022   10:09 AM 12/18/2021    8:30 AM  Depression screen PHQ 2/9  Decreased Interest 0 0 0 0 0  Down, Depressed, Hopeless 0 0 0 0 0  PHQ - 2 Score 0 0 0 0 0  Altered  sleeping 0 1 1 1   $ Tired, decreased energy 0 1 1 1   $ Change in appetite 0 0 0 1   Feeling bad or failure about yourself  0 0 0 0   Trouble concentrating 0 0 0 0   Moving slowly or fidgety/restless 0 0 0 0   Suicidal thoughts 0 0 0 0   PHQ-9 Score 0 2 2 3   $ Difficult doing work/chores Not difficult at all Not difficult at all Not difficult at all         08/31/2022    9:44 AM 08/31/2022    4:57 PM 08/31/2022    7:41 PM 01/12/2023    2:33 PM 01/28/2023    8:53 AM  Fall Risk  Falls in the past year?    0 0  Was there an injury with Fall?    0 0  Fall Risk Category Calculator    0 0  (RETIRED) Patient Fall Risk Level High fall risk High fall risk High fall risk    Patient at Risk for Falls Due to    No Fall Risks No Fall Risks  Fall risk Follow up     Falls evaluation completed    Functional  Status Survey: Is the patient deaf or have difficulty hearing?: No Does the patient have difficulty seeing, even when wearing glasses/contacts?: No Does the patient have difficulty concentrating, remembering, or making decisions?: No Does the patient have difficulty walking or climbing stairs?: No Does the patient have difficulty dressing or bathing?: No Does the patient have difficulty doing errands alone such as visiting a doctor's office or shopping?: No   Past Medical History:  Past Medical History:  Diagnosis Date   Arthritis    knees   Degenerative disc disease, lumbar    Hypertension    Osteoporosis     Surgical History:  Past Surgical History:  Procedure Laterality Date   FRACTURE SURGERY Right 06/25/2011   wrist   FRACTURE SURGERY Left 06/25/2011   elbow   GANGLION CYST EXCISION Left 1994   KNEE ARTHROPLASTY Right 08/31/2022   Procedure: COMPUTER ASSISTED TOTAL KNEE ARTHROPLASTY;  Surgeon: Dereck Leep, MD;  Location: [facility] OR[facility];  Service: Orthopedics;  Laterality: Right;   KNEE ARTHROSCOPY Right 05/07/2021   Procedure: ARTHROSCOPY KNEE;  Surgeon: Dereck Leep, MD;   Location: [facility];  Service: Orthopedics;  Laterality: Right;   KNEE ARTHROSCOPY Left 04/08/2022   Procedure: ARTHROSCOPY KNEE;  Surgeon: Dereck Leep, MD;  Location: [facility];  Service: Orthopedics;  Laterality: Left;   TONSILLECTOMY      Medications:  Current Outpatient Medications on File Prior to Visit  Medication Sig   acetaminophen (TYLENOL) 650 MG CR tablet Take 650 mg by mouth every 6 (six) hours as needed for pain.   amLODipine (NORVASC) 5 MG tablet Take 1 tablet (5 mg total) by mouth daily.   Calcium Carbonate (CALCARB 600 PO) Take 1,800 mg by mouth daily.   calcium-vitamin D (OSCAL WITH D) 500-5 MG-MCG tablet Take 2 tablets by mouth daily.   cholecalciferol (VITAMIN D3) 25 MCG (1000 UNIT) tablet Take 1,000 Units by mouth daily.   losartan (COZAAR) 100 MG tablet Take 1 tablet (100 mg total) by mouth daily.   Misc Natural Products (OSTEO BI-FLEX/5-LOXIN ADVANCED) TABS Take 2 tablets by mouth daily.   No current facility-administered medications on file prior to visit.    Allergies:  Allergies  Allergen Reactions   Cat Hair Extract     Stuffy nose itchy eyes    Social History:  Social History   Socioeconomic History   Marital status: Married    Spouse name: Marya Amsler   Number of children: 2   Years of education: Not on file   Highest education level: Not on file  Occupational History   Not on file  Tobacco Use   Smoking status: Never   Smokeless tobacco: Never  Vaping Use   Vaping Use: Never used  Substance and Sexual Activity   Alcohol use: Yes    Comment: on occassion   Drug use: No   Sexual activity: Yes  Other Topics Concern   Not on file  Social History Narrative   Not on file   Social Determinants of Health   Financial Resource Strain: Low Risk  (01/28/2023)   Overall Financial Resource Strain (CARDIA)    Difficulty of Paying Living Expenses: Not hard at all  Food Insecurity: No Food Insecurity (01/28/2023)   Hunger Vital Sign    Worried About  Running Out of Food in the Last Year: Never true    Ran Out of Food in the Last Year: Never true  Transportation Needs: No Transportation Needs (01/28/2023)   [place] - Transportation  Lack of Transportation (Medical): No    Lack of Transportation (Non-Medical): No  Physical Activity: Insufficiently Active (01/28/2023)   Exercise Vital Sign    Days of Exercise per Week: 3 days    Minutes of Exercise per Session: 20 min  Stress: No Stress Concern Present (01/28/2023)   [place]    Feeling of Stress : Not at all  Social Connections: [place] (01/28/2023)   Social Connection and Isolation Panel [NHANES]    Frequency of Communication with Friends and Family: More than three times a week    Frequency of Social Gatherings with Friends and Family: More than three times a week    Attends Religious Services: More than 4 times per year    Active Member of [employer] or Organizations: Yes    Attends Archivist Meetings: Never    Marital Status: Married  Human resources officer Violence: Not At Risk (01/28/2023)   Humiliation, Afraid, Rape, and Kick questionnaire    Fear of Current or Ex-Partner: No    Emotionally Abused: No    Physically Abused: No    Sexually Abused: No   Social History   Tobacco Use  Smoking Status Never  Smokeless Tobacco Never   Social History   Substance and Sexual Activity  Alcohol Use Yes   Comment: on occassion    Family History:  Family History  Problem Relation Age of Onset   Heart disease Mother    Hypertension Mother    Depression Mother    Alcohol abuse Father    COPD Father    Alcohol abuse Brother    Cancer Brother        colon   Hyperthyroidism Sister    Hypertension Sister    Cancer Maternal Grandmother    Heart disease Maternal Grandfather    Hypertension Brother    Depression Brother    Osteoporosis Brother     Past medical history, surgical history,  medications, allergies, family history and social history reviewed with patient today and changes made to appropriate areas of the chart.   Review of Systems - negative All other ROS negative except what is listed above and in the HPI.      Objective:    BP 133/81   Pulse 78   Temp 97.7 F (36.5 C) (Oral)   Ht 5' 6.26" (1.683 m)   Wt 211 lb (95.7 kg)   LMP  (LMP Unknown)   SpO2 95%   BMI 33.79 kg/m   Wt Readings from Last 3 Encounters:  01/28/23 211 lb (95.7 kg)  01/12/23 207 lb 4.8 oz (94 kg)  08/31/22 200 lb (90.7 kg)    Physical Exam Vitals and nursing note reviewed.  Constitutional:      General: She is awake. She is not in acute distress.    Appearance: She is well-developed. She is not ill-appearing.  HENT:     Head: Normocephalic and atraumatic.     Right Ear: Hearing, tympanic membrane, ear canal and external ear normal. No drainage.     Left Ear: Hearing, tympanic membrane, ear canal and external ear normal. No drainage.     Nose: Nose normal.     Right Sinus: No maxillary sinus tenderness or frontal sinus tenderness.     Left Sinus: No maxillary sinus tenderness or frontal sinus tenderness.     Mouth/Throat:     Mouth: Mucous membranes are moist.     Pharynx: Oropharynx is clear. Uvula  midline. No pharyngeal swelling, oropharyngeal exudate or posterior oropharyngeal erythema.  Eyes:     General: Lids are normal.        Right eye: No discharge.        Left eye: No discharge.     Extraocular Movements: Extraocular movements intact.     Conjunctiva/sclera: Conjunctivae normal.     Pupils: Pupils are equal, round, and reactive to light.     Visual Fields: Right eye visual fields normal and left eye visual fields normal.  Neck:     Thyroid: No thyromegaly.     Vascular: No carotid bruit.     Trachea: Trachea normal.  Cardiovascular:     Rate and Rhythm: Normal rate and regular rhythm.     Heart sounds: Normal heart sounds. No murmur heard.    No gallop.   Pulmonary:     Effort: Pulmonary effort is normal. No accessory muscle usage or respiratory distress.     Breath sounds: Normal breath sounds.  Chest:  Breasts:    Right: Normal.     Left: Normal.  Abdominal:     General: Bowel sounds are normal.     Palpations: Abdomen is soft. There is no hepatomegaly or splenomegaly.     Tenderness: There is no abdominal tenderness.  Musculoskeletal:        General: Normal range of motion.     Cervical back: Normal range of motion and neck supple.     Right lower leg: No edema.     Left lower leg: No edema.  Lymphadenopathy:     Head:     Right side of head: No submental, submandibular, tonsillar, preauricular or posterior auricular adenopathy.     Left side of head: No submental, submandibular, tonsillar, preauricular or posterior auricular adenopathy.     Cervical: No cervical adenopathy.     Upper Body:     Right upper body: No supraclavicular, axillary or pectoral adenopathy.     Left upper body: No supraclavicular, axillary or pectoral adenopathy.  Skin:    General: Skin is warm and dry.     Capillary Refill: Capillary refill takes less than 2 seconds.     Findings: No rash.  Neurological:     Mental Status: She is alert and oriented to person, place, and time.     Gait: Gait is intact.     Deep Tendon Reflexes: Reflexes are normal and symmetric.     Reflex Scores:      Brachioradialis reflexes are 2+ on the right side and 2+ on the left side.      Patellar reflexes are 2+ on the right side and 2+ on the left side. Psychiatric:        Attention and Perception: Attention normal.        Mood and Affect: Mood normal.        Speech: Speech normal.        Behavior: Behavior normal. Behavior is cooperative.        Thought Content: Thought content normal.        Judgment: Judgment normal.       01/28/2023    9:02 AM  6CIT Screen  What Year? 0 points  What month? 0 points  What time? 0 points  Count back from 20 0 points  Months  in reverse 0 points  Repeat phrase 2 points  Total Score 2 points   Results for orders placed or performed during the hospital encounter of 08/31/22  ABO/Rh  Result Value Ref Range   ABO/RH(D)      A POS Performed at [facility], [address] [place], [place] [address]       Assessment & Plan:   Problem List Items Addressed This Visit       Cardiovascular and Mediastinum   Hypertension    Chronic, stable.  BP at goal in office today. Will continue current medication regimen and adjust as needed.  Continue to monitor BP at home regularly and focus on DASH diet.  Refills up to date.  LABS: CBC, CMP, TSH.  Return in 6 months.      Relevant Orders   CBC with Differential/Platelet   Comprehensive metabolic panel   TSH     Musculoskeletal and Integument   Osteopenia of lumbar spine    Chronic, ongoing with some improvement recent scan.  Return in 6 months.  Vit D level today.  Next scan May 2027.      Relevant Orders   VITAMIN D 25 Hydroxy (Vit-D Deficiency, Fractures)     Other   Elevated LDL cholesterol level    Ongoing.  No current medications.  Recent LDL <100.  Current ASCVD 9.9%.  Continue focus on diet and regular exercise.  Initiate statin as needed -- if ASCVD remains elevated this check we discussed low dose statin initiation.  Educated her on this.      Relevant Orders   Comprehensive metabolic panel   Lipid Panel w/o Chol/HDL Ratio   Family history of colon cancer    Continue colonoscopies as scheduled.      Obesity    BMI 33.79. Recommended eating smaller high protein, low fat meals more frequently and exercising 30 mins a day 5 times a week with a goal of 10-15lb weight loss in the next 3 months. Patient voiced their understanding and motivation to adhere to these recommendations.       Other Visit Diagnoses     Medicare annual wellness visit, subsequent    -  Primary   Medicare wellness due and performed at visit today.   Encounter for  screening mammogram for malignant neoplasm of breast       Mammogram due after 05/06/23.   Relevant Orders   MM 3D SCREEN BREAST BILATERAL   Encounter for annual physical exam       Annual physical today with labs and health maintenance reviewed, discussed with patient.        Follow up plan: Return in about 6 months (around 07/29/2023) for HTN/HLD, OSTEOPENIA.   LABORATORY TESTING:  - Pap smear: not applicable -- last pap [id number] was negative HPV and cytology, over age 68  IMMUNIZATIONS:  - Tdap: Tetanus vaccination status reviewed: last tetanus booster within 10 years. - Influenza: Up To Date - Pneumovax: Up to date - Prevnar: Up to date - HPV: Not applicable - Zostavax vaccine: Up to date x 1, needs second one - Covid: has not received, refuses  SCREENING: -Mammogram: Up to date = 05/05/22 last - Colonoscopy: Up to date -- due 03/28/2028 - Bone Density: Up to date due next in 2027 -Hearing Test: Not applicable  -Spirometry: Not applicable   PATIENT COUNSELING:   Advised to take 1 mg of folate supplement per day if capable of pregnancy.   Sexuality: Discussed sexually transmitted diseases, partner selection, use of condoms, avoidance of unintended pregnancy  and contraceptive alternatives.   Advised to avoid cigarette smoking.  I discussed with the patient that most people either  abstain from alcohol or drink within safe limits (<=14/week and <=4 drinks/occasion for males, <=7/weeks and <= 3 drinks/occasion for females) and that the risk for alcohol disorders and other health effects rises proportionally with the number of drinks per week and how often a drinker exceeds daily limits.  Discussed cessation/primary prevention of drug use and availability of treatment for abuse.   Diet: Encouraged to adjust caloric intake to maintain  or achieve ideal body weight, to reduce intake of dietary saturated fat and total fat, to limit sodium intake by avoiding high sodium foods and not  adding table salt, and to maintain adequate dietary potassium and calcium preferably from fresh fruits, vegetables, and low-fat dairy products.    Stressed the importance of regular exercise  Injury prevention: Discussed safety belts, safety helmets, smoke detector, smoking near bedding or upholstery.   Dental health: Discussed importance of regular tooth brushing, flossing, and dental visits.    NEXT PREVENTATIVE PHYSICAL DUE IN 1 YEAR. Return in about 6 months (around 07/29/2023) for HTN/HLD, OSTEOPENIA.

## 2023-01-28 NOTE — Assessment & Plan Note (Signed)
Chronic, ongoing with some improvement recent scan.  Return in 6 months.  Vit D level today.  Next scan May 2027.

## 2023-01-28 NOTE — Assessment & Plan Note (Signed)
BMI 33.79. Recommended eating smaller high protein, low fat meals more frequently and exercising 30 mins a day 5 times a week with a goal of 10-15lb weight loss in the next 3 months. Patient voiced their understanding and motivation to adhere to these recommendations.

## 2023-01-28 NOTE — Assessment & Plan Note (Signed)
Ongoing.  No current medications.  Recent LDL <100.  Current ASCVD 9.9%.  Continue focus on diet and regular exercise.  Initiate statin as needed -- if ASCVD remains elevated this check we discussed low dose statin initiation.  Educated her on this.

## 2023-01-28 NOTE — Assessment & Plan Note (Signed)
Continue colonoscopies as scheduled.

## 2023-01-28 NOTE — Assessment & Plan Note (Signed)
Chronic, stable.  BP at goal in office today. Will continue current medication regimen and adjust as needed.  Continue to monitor BP at home regularly and focus on DASH diet.  Refills up to date.  LABS: CBC, CMP, TSH.  Return in 6 months.

## 2023-01-29 ENCOUNTER — Other Ambulatory Visit: Payer: Self-pay | Admitting: Nurse Practitioner

## 2023-01-29 LAB — CBC WITH DIFFERENTIAL/PLATELET
Basophils Absolute: 0 10*3/uL (ref 0.0–0.2)
Basos: 1 %
EOS (ABSOLUTE): 0.2 10*3/uL (ref 0.0–0.4)
Eos: 4 %
Hematocrit: 41.2 % (ref 34.0–46.6)
Hemoglobin: 13.9 g/dL (ref 11.1–15.9)
Immature Grans (Abs): 0 10*3/uL (ref 0.0–0.1)
Immature Granulocytes: 0 %
Lymphocytes Absolute: 2.3 10*3/uL (ref 0.7–3.1)
Lymphs: 37 %
MCH: 30 pg (ref 26.6–33.0)
MCHC: 33.7 g/dL (ref 31.5–35.7)
MCV: 89 fL (ref 79–97)
Monocytes Absolute: 0.5 10*3/uL (ref 0.1–0.9)
Monocytes: 8 %
Neutrophils Absolute: 3.2 10*3/uL (ref 1.4–7.0)
Neutrophils: 50 %
Platelets: 326 10*3/uL (ref 150–450)
RBC: 4.63 x10E6/uL (ref 3.77–5.28)
RDW: 13.2 % (ref 11.7–15.4)
WBC: 6.2 10*3/uL (ref 3.4–10.8)

## 2023-01-29 LAB — COMPREHENSIVE METABOLIC PANEL
ALT: 15 IU/L (ref 0–32)
AST: 15 IU/L (ref 0–40)
Albumin/Globulin Ratio: 1.5 (ref 1.2–2.2)
Albumin: 4.3 g/dL (ref 3.9–4.9)
Alkaline Phosphatase: 67 IU/L (ref 44–121)
BUN/Creatinine Ratio: 26 (ref 12–28)
BUN: 21 mg/dL (ref 8–27)
Bilirubin Total: 0.4 mg/dL (ref 0.0–1.2)
CO2: 22 mmol/L (ref 20–29)
Calcium: 9.7 mg/dL (ref 8.7–10.3)
Chloride: 103 mmol/L (ref 96–106)
Creatinine, Ser: 0.8 mg/dL (ref 0.57–1.00)
Globulin, Total: 2.9 g/dL (ref 1.5–4.5)
Glucose: 104 mg/dL — ABNORMAL HIGH (ref 70–99)
Potassium: 4.4 mmol/L (ref 3.5–5.2)
Sodium: 140 mmol/L (ref 134–144)
Total Protein: 7.2 g/dL (ref 6.0–8.5)
eGFR: 80 mL/min/{1.73_m2} (ref >59)

## 2023-01-29 LAB — VITAMIN D 25 HYDROXY (VIT D DEFICIENCY, FRACTURES): Vit D, 25-Hydroxy: 67.8 ng/mL (ref 30.0–100.0)

## 2023-01-29 LAB — TSH: TSH: 0.944 u[IU]/mL (ref 0.450–4.500)

## 2023-01-29 LAB — LIPID PANEL W/O CHOL/HDL RATIO
Cholesterol, Total: 168 mg/dL (ref 100–199)
HDL: 55 mg/dL (ref >39)
LDL Chol Calc (NIH): 99 mg/dL (ref 0–99)
Triglycerides: 75 mg/dL (ref 0–149)
VLDL Cholesterol Cal: 14 mg/dL (ref 5–40)

## 2023-01-29 MED ORDER — ROSUVASTATIN CALCIUM 10 MG PO TABS: 10.0000 mg | ORAL_TABLET | Freq: Every day | ORAL | 3 refills | Status: DC

## 2023-01-29 NOTE — Progress Notes (Signed)
Contacted via [place] evening Danielys, your labs have returned: - Kidney function, creatinine and eGFR, remains normal, as is liver function, AST and ALT.  - CBC shows no anemia or infection. - Thyroid and Vitamin D levels normal. - Cholesterol labs show stable LDL, but risk score below remains elevated at 10.3%.  I do recommend we start a low dose of Rosuvastatin 10 MG nightly.  I will send this in and if any side effects let me know right away.  We will recheck levels next visit.  Any questions? The 10-year ASCVD risk score (Arnett DK, et al., 2019) is: 10.3%   Values used to calculate the score:     Age: 67 years     Sex: Female     Is Non-Hispanic African American: No     Diabetic: No     Tobacco smoker: No     Systolic Blood Pressure: Q000111Q mmHg     Is BP treated: Yes     HDL Cholesterol: 55 mg/dL     Total Cholesterol: 168 mg/dL Keep being amazing!!  Thank you for allowing me to participate in your care.  I appreciate you. Kindest regards, Jalesia Loudenslager

## 2023-02-18 IMAGING — MR MR KNEE*R* W/O CM
6 series · 40 of 40 positions shown · non-contrast
Comparison: None.

CLINICAL DATA: Chronic right knee pain since fall last year. No
prior surgery.

EXAM:
MRI OF THE RIGHT KNEE WITHOUT CONTRAST
TECHNIQUE: Multiplanar, multisequence MR imaging of the knee was performed. No
intravenous contrast was administered.

[Series 8: T2 fat-sat · axial · right · 4.0mm · 0.50mm/px · z∈[-72,+52]mm · 6 of 26 slices shown (1 of 3)]
[im 1/26]
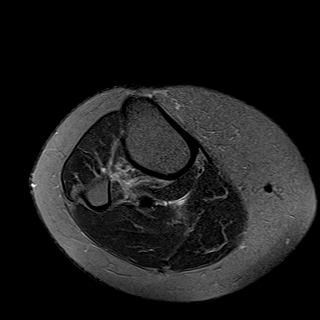
[im 6/26]
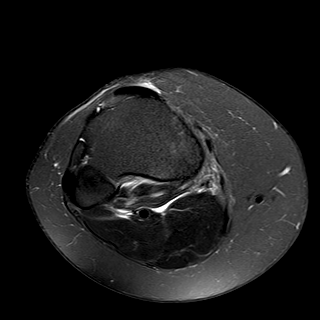
[im 11/26]
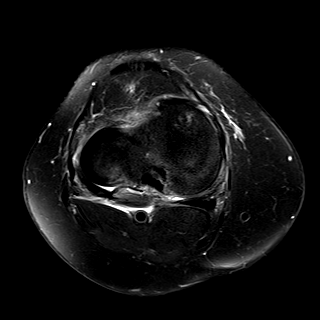
[im 16/26]
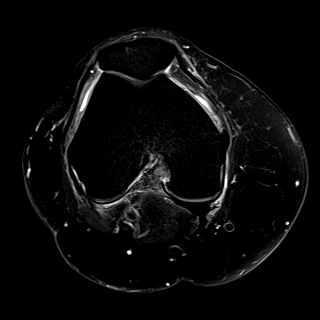
[im 21/26]
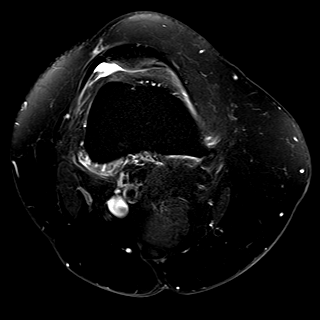
[im 26/26]
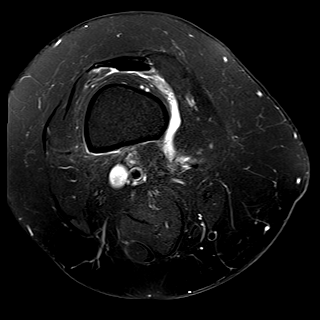

[Series 9: T2 fat-sat · coronal · right · 4.0mm · 0.59mm/px · 6 of 24 slices shown (2 of 3)]
[im 1/24]
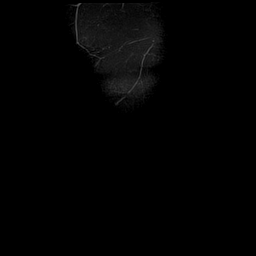
[im 5/24]
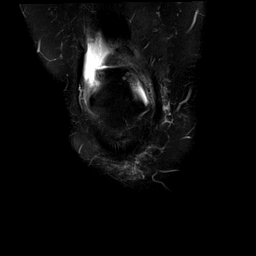
[im 10/24]
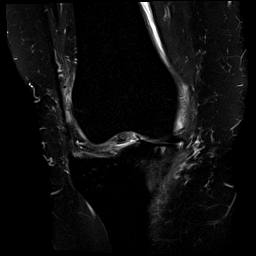
[im 14/24]
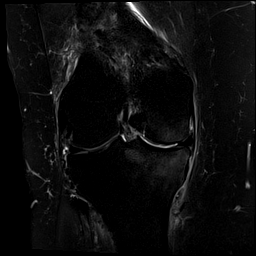
[im 19/24]
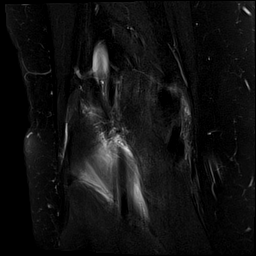
[im 24/24]
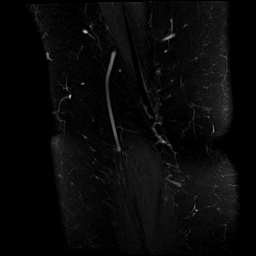

[Series 10: T1 · coronal · right · 4.0mm · 0.59mm/px · 6 of 24 slices shown]
[im 1/24]
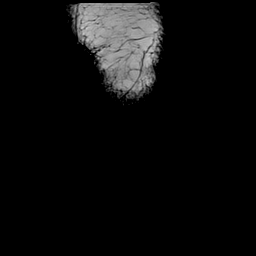
[im 5/24]
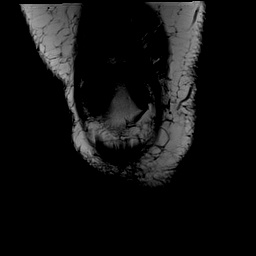
[im 10/24]
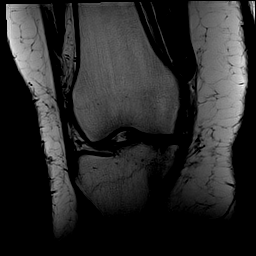
[im 14/24]
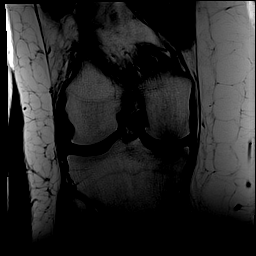
[im 19/24]
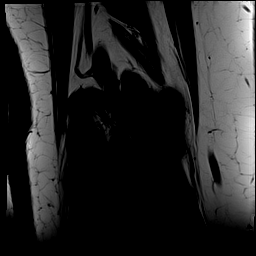
[im 24/24]
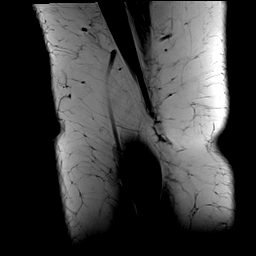

[Series 11: PD fat-sat · coronal · right · 4.0mm · 0.59mm/px · 6 of 24 slices shown (1 of 2)]
[im 1/24]
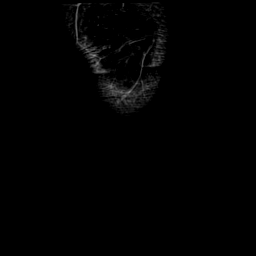
[im 5/24]
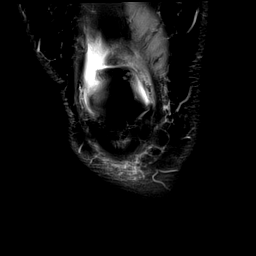
[im 10/24]
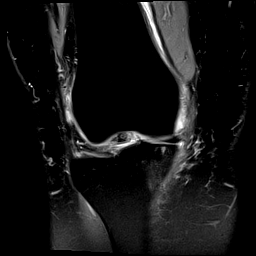
[im 14/24]
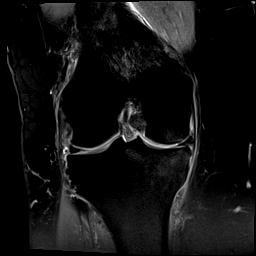
[im 19/24]
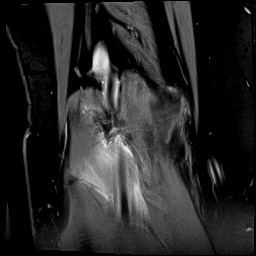
[im 24/24]
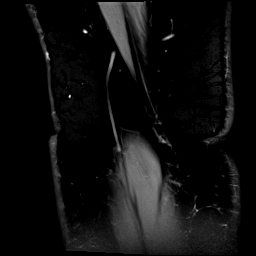

[Series 12: PD fat-sat · sagittal · right · 3.0mm · 0.59mm/px · 8 of 30 slices shown (2 of 2)]
[im 1/30]
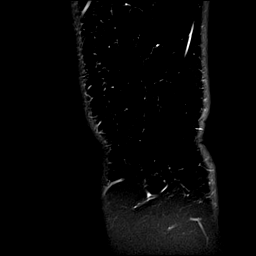
[im 5/30]
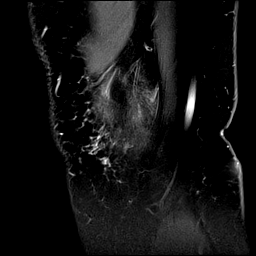
[im 9/30]
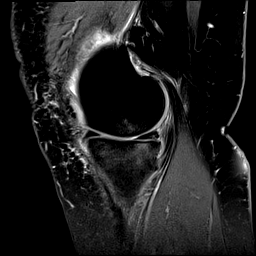
[im 13/30]
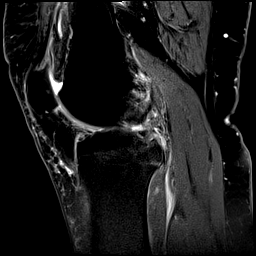
[im 17/30]
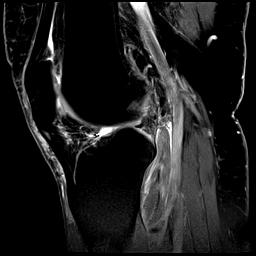
[im 21/30]
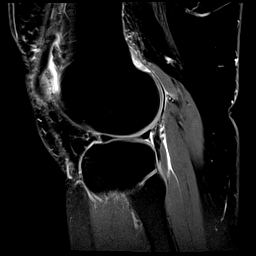
[im 25/30]
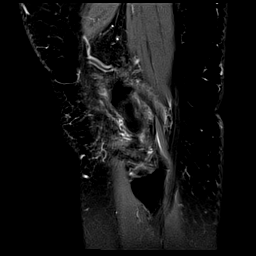
[im 30/30]
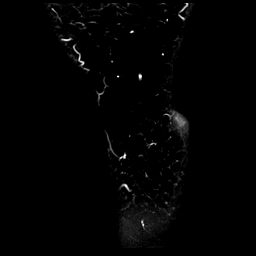

[Series 13: T2 fat-sat · sagittal · right · 3.0mm · 0.59mm/px · 8 of 32 slices shown (3 of 3)]
[im 1/32]
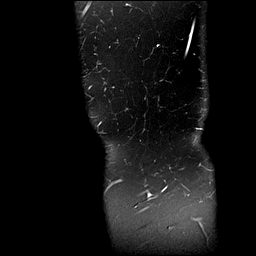
[im 5/32]
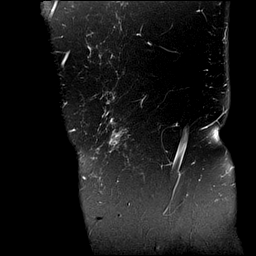
[im 9/32]
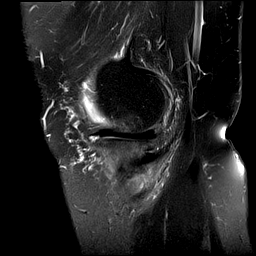
[im 14/32]
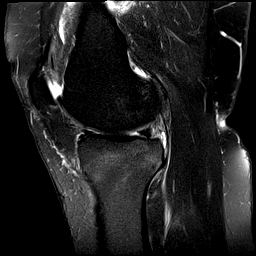
[im 18/32]
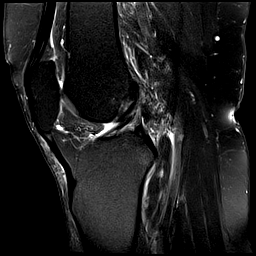
[im 23/32]
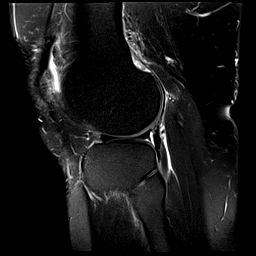
[im 27/32]
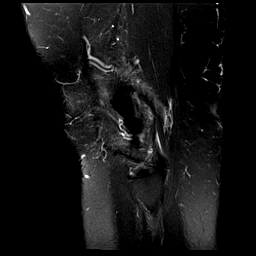
[im 32/32]
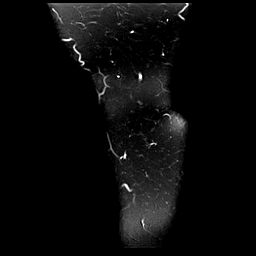

[40 of 40 positions shown; findings below may reference images not displayed]

FINDINGS: MENISCI

Medial meniscus: Complex tear of the posterior horn (series 12,
images 20-22). Peripheral oblique tear at the body/posterior horn
junction (series 11, image 10).

Lateral meniscus:  Intact.

LIGAMENTS

Cruciates:  Intact ACL and PCL.

Collaterals: Medial collateral ligament is intact. Lateral
collateral ligament complex is intact.

CARTILAGE

Patellofemoral: Full-thickness cartilage loss over the patellar apex
and medial facet.

Medial: High-grade partial-thickness cartilage loss over the central
and posterior weight-bearing medial femoral condyle and central
medial tibial plateau.

Lateral: Focal partial-thickness cartilage loss over the posterior
lateral tibial plateau.

Joint:  Small joint effusion.  Minimal edema within Hoffa's fat.

Popliteal Fossa:  No Baker cyst. Intact popliteus tendon.

Extensor Mechanism: Intact quadriceps tendon and patellar tendon.
Mild proximal patellar tendinosis. Intact medial and lateral
patellar retinaculum. Intact MPFL.

Bones: Mild degenerative subchondral marrow edema in the medial
compartment. No acute fracture or dislocation. No suspicious bone
lesion.

Other: None.
IMPRESSION: 1. Complex tear of the medial meniscus posterior horn. Additional
peripheral oblique tear at the body/posterior horn junction.
2. Mild tricompartmental osteoarthritis.

## 2023-02-22 ENCOUNTER — Encounter: Payer: Self-pay | Admitting: Nurse Practitioner

## 2023-03-17 ENCOUNTER — Encounter: Payer: Self-pay | Admitting: Nurse Practitioner

## 2023-03-20 NOTE — Patient Instructions (Signed)
Breast Tenderness Breast tenderness is a common problem for women of all ages, but may also occur in men. Breast tenderness has many possible causes, including hormone changes, infections, taking certain medicines, and caffeine intake. In women, the pain usually comes and goes with the menstrual cycle, but it can also be constant. Breast tenderness may range from mild discomfort to severe pain. You may have tests, such as a mammogram or an ultrasound, to check for any unusual findings. Having breast tenderness usually does not mean that you have breast cancer. Follow these instructions at home: Managing pain and discomfort  If directed, put ice on the painful area. To do this: Put ice in a plastic bag. Place a towel between your skin and the bag. Leave the ice on for 20 minutes, 2-3 times a day. If your skin turns bright red, remove the ice right away to prevent skin damage. The risk of skin damage is higher if you cannot feel pain, heat, or cold. Wear a supportive bra or chest support: During exercise. While sleeping, if your breasts are very tender. Medicines Take over-the-counter and prescription medicines only as told by your health care provider. If the cause of your pain is an infection, you may be prescribed an antibiotic medicine. If you were prescribed antibiotics, take them as told by your health care provider. Do not stop using the antibiotic even if you start to feel better. Eating and drinking Decrease the amount of caffeine in your diet. Instead, drink more water and choose caffeine-free drinks. Your health care provider may recommend that you lessen the amount of fat in your diet. You can do this by: Limiting fried foods. Cooking foods using methods such as baking, boiling, grilling, and broiling. General instructions  Keep a log of the days and times when your breasts are most tender. Ask your health care provider how to do breast exams at home. This will help you notice if  you have an unusual growth or lump. Keep all follow-up visits. Contact a health care provider if: Any part of your breast is hard, red, and hot to the touch. This may be a sign of infection. You are a woman and have a new or painful lump in your breast that remains after your menstrual period ends. You are not breastfeeding and you have fluid, especially blood or pus, coming out of your nipples. You have a fever. Your pain does not improve or it gets worse. Your pain is interfering with your daily activities. Summary Breast tenderness may range from mild discomfort to severe pain. Breast tenderness has many possible causes, including hormone changes, infections, taking certain medicines, and caffeine intake. It can be treated with ice, wearing a supportive bra or chest support, and medicines. Make changes to your diet as told by your health care provider. This information is not intended to replace advice given to you by your health care provider. Make sure you discuss any questions you have with your health care provider. Document Revised: 02/11/2022 Document Reviewed: 02/11/2022 Elsevier Patient Education  2023 [employer]  

## 2023-03-22 ENCOUNTER — Ambulatory Visit: Payer: Medicare PPO | Admitting: Nurse Practitioner

## 2023-03-22 ENCOUNTER — Encounter: Payer: Self-pay | Admitting: Nurse Practitioner

## 2023-03-22 VITALS — BP 138/82 | HR 70 | Temp 97.8°F | Ht 66.26 in | Wt 212.4 lb

## 2023-03-22 DIAGNOSIS — N6312 Unspecified lump in the right breast, upper inner quadrant: Secondary | ICD-10-CM | POA: Diagnosis not present

## 2023-03-22 NOTE — Progress Notes (Signed)
BP 138/82 (BP Location: Left Arm, Patient Position: Sitting, Cuff Size: Normal)   Pulse 70   Temp 97.8 F (36.6 C) (Oral)   Ht 5' 6.26" (1.683 m)   Wt 212 lb 6.4 oz (96.3 kg)   LMP  (LMP Unknown)   SpO2 97%   BMI 34.01 kg/m    Subjective:    Patient ID: Mary Wood, female    DOB: 04/04/1956, 67 y.o.   MRN: [id number]  HPI: Mary Wood is a 67 y.o. female  Chief Complaint  Patient presents with   spot on breast    Noticed spot on right breast on Wednesday last week. Patient states that there is a red area and it is hard underneath with no pain associated with it   BREAST CHANGES Noticed spot to right breast on Wednesday last week.  There is a red area and firm to touch underneath.  New finding. Duration :days Location: right Redness: yes Swelling:  firm area Trauma: no trauma Breastfeeding: no Associated with menstral cycle: no Nipple discharge: no Breast lump: yes Status: stable Treatments attempted: none Previous mammogram: yes on 05/05/22  Relevant past medical, surgical, family and social history reviewed and updated as indicated. Interim medical history since our last visit reviewed. Allergies and medications reviewed and updated.  Review of Systems  Constitutional:  Negative for activity change, appetite change, diaphoresis, fatigue and fever.  Respiratory:  Negative for cough, chest tightness and shortness of breath.   Cardiovascular:  Negative for chest pain, palpitations and leg swelling.  Gastrointestinal: Negative.   Neurological: Negative.   Psychiatric/Behavioral: Negative.      Per HPI unless specifically indicated above     Objective:    BP 138/82 (BP Location: Left Arm, Patient Position: Sitting, Cuff Size: Normal)   Pulse 70   Temp 97.8 F (36.6 C) (Oral)   Ht 5' 6.26" (1.683 m)   Wt 212 lb 6.4 oz (96.3 kg)   LMP  (LMP Unknown)   SpO2 97%   BMI 34.01 kg/m   Wt Readings from Last 3 Encounters:  03/22/23 212 lb 6.4 oz (96.3 kg)   01/28/23 211 lb (95.7 kg)  01/12/23 207 lb 4.8 oz (94 kg)    Physical Exam Vitals and nursing note reviewed. Exam conducted with a chaperone present.  Constitutional:      General: She is not in acute distress.    Appearance: Normal appearance. She is well-groomed. She is obese. She is not ill-appearing or toxic-appearing.  HENT:     Head: Normocephalic and atraumatic.     Right Ear: Hearing and external ear normal.     Left Ear: Hearing and external ear normal.  Eyes:     Conjunctiva/sclera: Conjunctivae normal.  Neck:     Vascular: No carotid bruit.  Cardiovascular:     Rate and Rhythm: Normal rate and regular rhythm.     Pulses: Normal pulses.     Heart sounds: Normal heart sounds. No murmur heard.    No gallop.  Pulmonary:     Effort: Pulmonary effort is normal. No accessory muscle usage or respiratory distress.     Breath sounds: Normal breath sounds.  Chest:  Breasts:    Right: Mass and skin change present. No swelling, inverted nipple, nipple discharge or tenderness.     Left: Normal.    Abdominal:     General: There is no distension.     Palpations: Abdomen is soft.     Tenderness: There is  no abdominal tenderness.  Musculoskeletal:        General: Normal range of motion.     Cervical back: Neck supple.  Lymphadenopathy:     Cervical: No cervical adenopathy.     Upper Body:     Right upper body: No supraclavicular, axillary or pectoral adenopathy.     Left upper body: No supraclavicular, axillary or pectoral adenopathy.  Neurological:     General: No focal deficit present.     Mental Status: She is alert and oriented to person, place, and time. Mental status is at baseline.  Psychiatric:        Attention and Perception: Attention normal.        Mood and Affect: Mood normal.        Speech: Speech normal.        Behavior: Behavior normal.        Thought Content: Thought content normal.     Results for orders placed or performed in visit on 01/28/23  CBC  with Differential/Platelet  Result Value Ref Range   WBC 6.2 3.4 - 10.8 x10E3/uL   RBC 4.63 3.77 - 5.28 x10E6/uL   Hemoglobin 13.9 11.1 - 15.9 g/dL   Hematocrit 68.3 41.9 - 46.6 %   MCV 89 79 - 97 fL   MCH 30.0 26.6 - 33.0 pg   MCHC 33.7 31.5 - 35.7 g/dL   RDW 62.2 29.7 - 98.9 %   Platelets 326 150 - 450 x10E3/uL   Neutrophils 50 Not Estab. %   Lymphs 37 Not Estab. %   Monocytes 8 Not Estab. %   Eos 4 Not Estab. %   Basos 1 Not Estab. %   Neutrophils Absolute 3.2 1.4 - 7.0 x10E3/uL   Lymphocytes Absolute 2.3 0.7 - 3.1 x10E3/uL   Monocytes Absolute 0.5 0.1 - 0.9 x10E3/uL   EOS (ABSOLUTE) 0.2 0.0 - 0.4 x10E3/uL   Basophils Absolute 0.0 0.0 - 0.2 x10E3/uL   Immature Granulocytes 0 Not Estab. %   Immature Grans (Abs) 0.0 0.0 - 0.1 x10E3/uL  Comprehensive metabolic panel  Result Value Ref Range   Glucose 104 (H) 70 - 99 mg/dL   BUN 21 8 - 27 mg/dL   Creatinine, Ser 2.11 0.57 - 1.00 mg/dL   eGFR 80 >94 RD/EYC/1.44   BUN/Creatinine Ratio 26 12 - 28   Sodium 140 134 - 144 mmol/L   Potassium 4.4 3.5 - 5.2 mmol/L   Chloride 103 96 - 106 mmol/L   CO2 22 20 - 29 mmol/L   Calcium 9.7 8.7 - 10.3 mg/dL   Total Protein 7.2 6.0 - 8.5 g/dL   Albumin 4.3 3.9 - 4.9 g/dL   Globulin, Total 2.9 1.5 - 4.5 g/dL   Albumin/Globulin Ratio 1.5 1.2 - 2.2   Bilirubin Total 0.4 0.0 - 1.2 mg/dL   Alkaline Phosphatase 67 44 - 121 IU/L   AST 15 0 - 40 IU/L   ALT 15 0 - 32 IU/L  Lipid Panel w/o Chol/HDL Ratio  Result Value Ref Range   Cholesterol, Total 168 100 - 199 mg/dL   Triglycerides 75 0 - 149 mg/dL   HDL 55 >81 mg/dL   VLDL Cholesterol Cal 14 5 - 40 mg/dL   LDL Chol Calc (NIH) 99 0 - 99 mg/dL  TSH  Result Value Ref Range   TSH 0.944 0.450 - 4.500 uIU/mL  VITAMIN D 25 Hydroxy (Vit-D Deficiency, Fractures)  Result Value Ref Range   Vit D, 25-Hydroxy 67.8 30.0 - 100.0  ng/mL      Assessment & Plan:   Problem List Items Addressed This Visit       Other   Mass of upper inner quadrant of  right breast - Primary    Small, pea-sized non mobile mass noted to 12 o'clock right nipple area with mild changes to skin in area.  Will order diagnostic imaging and ultrasound -- she is aware to call [facility] and schedule, plans on doing this today.  Follow-up after imaging completed.  Recommend to continue to monitor breasts closely.  No family history of breast cancer.      Relevant Orders   MM 3D DIAGNOSTIC MAMMOGRAM BILATERAL BREAST   US LIMITED ULTRASOUND INCLUDING AXILLA RIGHT BREAST     Follow up plan: Return if symptoms worsen or fail to improve.

## 2023-03-22 NOTE — Assessment & Plan Note (Signed)
Small, pea-sized non mobile mass noted to 12 o'clock right nipple area with mild changes to skin in area.  Will order diagnostic imaging and ultrasound -- she is aware to call [facility] and schedule, plans on doing this today.  Follow-up after imaging completed.  Recommend to continue to monitor breasts closely.  No family history of breast cancer.

## 2023-03-24 ENCOUNTER — Other Ambulatory Visit: Payer: Self-pay | Admitting: Nurse Practitioner

## 2023-03-24 ENCOUNTER — Ambulatory Visit
Admission: RE | Admit: 2023-03-24 | Discharge: 2023-03-24 | Disposition: A | Discharge status: Home / Self Care | Payer: Medicare PPO | Source: Ambulatory Visit | Attending: Nurse Practitioner | Admitting: Nurse Practitioner

## 2023-03-24 ENCOUNTER — Encounter: Payer: Self-pay | Admitting: Nurse Practitioner

## 2023-03-24 DIAGNOSIS — N6312 Unspecified lump in the right breast, upper inner quadrant: Secondary | ICD-10-CM

## 2023-03-24 DIAGNOSIS — R928 Other abnormal and inconclusive findings on diagnostic imaging of breast: Secondary | ICD-10-CM | POA: Diagnosis not present

## 2023-03-24 DIAGNOSIS — N6311 Unspecified lump in the right breast, upper outer quadrant: Secondary | ICD-10-CM | POA: Diagnosis not present

## 2023-03-25 ENCOUNTER — Other Ambulatory Visit: Payer: Self-pay | Admitting: Nurse Practitioner

## 2023-03-25 DIAGNOSIS — R928 Other abnormal and inconclusive findings on diagnostic imaging of breast: Secondary | ICD-10-CM

## 2023-03-25 NOTE — Progress Notes (Signed)
Contacted via MyChart   Good morning Mary Wood, I see you need to go for a biopsy.  If you have any questions about this please let me know.  I have been through 2 of these in the past.:)

## 2023-03-30 ENCOUNTER — Ambulatory Visit
Admission: RE | Admit: 2023-03-30 | Discharge: 2023-03-30 | Disposition: A | Discharge status: Home / Self Care | Payer: Medicare PPO | Source: Ambulatory Visit | Attending: Nurse Practitioner | Admitting: Nurse Practitioner

## 2023-03-30 DIAGNOSIS — N6011 Diffuse cystic mastopathy of right breast: Secondary | ICD-10-CM | POA: Diagnosis not present

## 2023-03-30 DIAGNOSIS — R928 Other abnormal and inconclusive findings on diagnostic imaging of breast: Secondary | ICD-10-CM

## 2023-03-30 DIAGNOSIS — N6311 Unspecified lump in the right breast, upper outer quadrant: Secondary | ICD-10-CM | POA: Diagnosis not present

## 2023-03-30 HISTORY — PX: BREAST BIOPSY: SHX20

## 2023-03-30 MED ORDER — LIDOCAINE HCL (PF) 1 % IJ SOLN
5.0000 mL | Freq: Once | INTRAMUSCULAR | Status: AC
  Administered 2023-03-30: 5 mL via INTRADERMAL

## 2023-03-30 MED ORDER — LIDOCAINE-EPINEPHRINE 1 %-1:100000 IJ SOLN
10.0000 mL | Freq: Once | INTRAMUSCULAR | Status: AC
  Administered 2023-03-30: 10 mL via INTRADERMAL

## 2023-03-31 LAB — SURGICAL PATHOLOGY

## 2023-05-04 DIAGNOSIS — H2513 Age-related nuclear cataract, bilateral: Secondary | ICD-10-CM | POA: Diagnosis not present

## 2023-05-04 DIAGNOSIS — H43811 Vitreous degeneration, right eye: Secondary | ICD-10-CM | POA: Diagnosis not present

## 2023-07-05 DIAGNOSIS — M1712 Unilateral primary osteoarthritis, left knee: Secondary | ICD-10-CM | POA: Diagnosis not present

## 2023-07-05 DIAGNOSIS — G8929 Other chronic pain: Secondary | ICD-10-CM | POA: Diagnosis not present

## 2023-07-05 DIAGNOSIS — M25562 Pain in left knee: Secondary | ICD-10-CM | POA: Diagnosis not present

## 2023-07-19 DIAGNOSIS — M1712 Unilateral primary osteoarthritis, left knee: Secondary | ICD-10-CM | POA: Diagnosis not present

## 2023-07-25 NOTE — Patient Instructions (Signed)
Be Involved in Caring For Your Health:  Taking Medications When medications are taken as directed, they can greatly improve your health. But if they are not taken as prescribed, they may not work. In some cases, not taking them correctly can be harmful. To help ensure your treatment remains effective and safe, understand your medications and how to take them. Bring your medications to each visit for review by your provider.  Your lab results, notes, and after visit summary will be available on My Chart. We strongly encourage you to use this feature. If lab results are abnormal the clinic will contact you with the appropriate steps. If the clinic does not contact you assume the results are satisfactory. You can always view your results on My Chart. If you have questions regarding your health or results, please contact the clinic during office hours. You can also ask questions on My Chart.  We at [facility] are grateful that you chose us to provide your care. We strive to provide evidence-based and compassionate care and are always looking for feedback. If you get a survey from the clinic please complete this so we can hear your opinions.  DASH Eating Plan DASH stands for Dietary Approaches to Stop Hypertension. The DASH eating plan is a healthy eating plan that has been shown to: Lower high blood pressure (hypertension). Reduce your risk for type 2 diabetes, heart disease, and stroke. Help with weight loss. What are tips for following this plan? Reading food labels Check food labels for the amount of salt (sodium) per serving. Choose foods with less than 5 percent of the Daily Value (DV) of sodium. In general, foods with less than 300 milligrams (mg) of sodium per serving fit into this eating plan. To find whole grains, look for the word "whole" as the first word in the ingredient list. Shopping Buy products labeled as "low-sodium" or "no salt added." Buy fresh foods. Avoid canned  foods and pre-made or frozen meals. Cooking Try not to add salt when you cook. Use salt-free seasonings or herbs instead of table salt or sea salt. Check with your health care provider or pharmacist before using salt substitutes. Do not fry foods. Cook foods in healthy ways, such as baking, boiling, grilling, roasting, or broiling. Cook using oils that are good for your heart. These include olive, canola, avocado, soybean, and sunflower oil. Meal planning  Eat a balanced diet. This should include: 4 or more servings of fruits and 4 or more servings of vegetables each day. Try to fill half of your plate with fruits and vegetables. 6-8 servings of whole grains each day. 6 or less servings of lean meat, poultry, or fish each day. 1 oz is 1 serving. A 3 oz (85 g) serving of meat is about the same size as the palm of your hand. One egg is 1 oz (28 g). 2-3 servings of low-fat dairy each day. One serving is 1 cup (237 mL). 1 serving of nuts, seeds, or beans 5 times each week. 2-3 servings of heart-healthy fats. Healthy fats called omega-3 fatty acids are found in foods such as walnuts, flaxseeds, fortified milks, and eggs. These fats are also found in cold-water fish, such as sardines, salmon, and mackerel. Limit how much you eat of: Canned or prepackaged foods. Food that is high in trans fat, such as fried foods. Food that is high in saturated fat, such as fatty meat. Desserts and other sweets, sugary drinks, and other foods with added sugar. Full-fat   dairy products. Do not salt foods before eating. Do not eat more than 4 egg yolks a week. Try to eat at least 2 vegetarian meals a week. Eat more home-cooked food and less restaurant, buffet, and fast food. Lifestyle When eating at a restaurant, ask if your food can be made with less salt or no salt. If you drink alcohol: Limit how much you have to: 0-1 drink a day if you are female. 0-2 drinks a day if you are female. Know how much alcohol is in  your drink. In the [place] one drink is one 12 oz bottle of beer (355 mL), one 5 oz glass of wine (148 mL), or one 1 oz glass of hard liquor (44 mL). General information Avoid eating more than 2,300 mg of salt a day. If you have hypertension, you may need to reduce your sodium intake to 1,500 mg a day. Work with your provider to stay at a healthy body weight or lose weight. Ask what the best weight range is for you. On most days of the week, get at least 30 minutes of exercise that causes your heart to beat faster. This may include walking, swimming, or biking. Work with your provider or dietitian to adjust your eating plan to meet your specific calorie needs. What foods should I eat? Fruits All fresh, dried, or frozen fruit. Canned fruits that are in their natural juice and do not have sugar added to them. Vegetables Fresh or frozen vegetables that are raw, steamed, roasted, or grilled. Low-sodium or reduced-sodium tomato and vegetable juice. Low-sodium or reduced-sodium tomato sauce and tomato paste. Low-sodium or reduced-sodium canned vegetables. Grains Whole-grain or whole-wheat bread. Whole-grain or whole-wheat pasta. Brown rice. Oatmeal. Quinoa. Bulgur. Whole-grain and low-sodium cereals. Pita bread. Low-fat, low-sodium crackers. Whole-wheat flour tortillas. Meats and other proteins Skinless chicken or turkey. Ground chicken or turkey. Pork with fat trimmed off. Fish and seafood. Egg whites. Dried beans, peas, or lentils. Unsalted nuts, nut butters, and seeds. Unsalted canned beans. Lean cuts of beef with fat trimmed off. Low-sodium, lean precooked or cured meat, such as sausages or meat loaves. Dairy Low-fat (1%) or fat-free (skim) milk. Reduced-fat, low-fat, or fat-free cheeses. Nonfat, low-sodium ricotta or cottage cheese. Low-fat or nonfat yogurt. Low-fat, low-sodium cheese. Fats and oils Soft margarine without trans fats. Vegetable oil. Reduced-fat, low-fat, or light mayonnaise and salad  dressings (reduced-sodium). Canola, safflower, olive, avocado, soybean, and sunflower oils. Avocado. Seasonings and condiments Herbs. Spices. Seasoning mixes without salt. Other foods Unsalted popcorn and pretzels. Fat-free sweets. The items listed above may not be all the foods and drinks you can have. Talk to a dietitian to learn more. What foods should I avoid? Fruits Canned fruit in a light or heavy syrup. Fried fruit. Fruit in cream or butter sauce. Vegetables Creamed or fried vegetables. Vegetables in a cheese sauce. Regular canned vegetables that are not marked as low-sodium or reduced-sodium. Regular canned tomato sauce and paste that are not marked as low-sodium or reduced-sodium. Regular tomato and vegetable juices that are not marked as low-sodium or reduced-sodium. Pickles. Olives. Grains Baked goods made with fat, such as croissants, muffins, or some breads. Dry pasta or rice meal packs. Meats and other proteins Fatty cuts of meat. Ribs. Fried meat. Bacon. Bologna, salami, and other precooked or cured meats, such as sausages or meat loaves, that are not lean and low in sodium. Fat from the back of a pig (fatback). Bratwurst. Salted nuts and seeds. Canned beans with added salt. Canned   or smoked fish. Whole eggs or egg yolks. Chicken or turkey with skin. Dairy Whole or 2% milk, cream, and half-and-half. Whole or full-fat cream cheese. Whole-fat or sweetened yogurt. Full-fat cheese. Nondairy creamers. Whipped toppings. Processed cheese and cheese spreads. Fats and oils Butter. Stick margarine. Lard. Shortening. Ghee. Bacon fat. Tropical oils, such as coconut, palm kernel, or palm oil. Seasonings and condiments Onion salt, garlic salt, seasoned salt, table salt, and sea salt. Worcestershire sauce. Tartar sauce. Barbecue sauce. Teriyaki sauce. Soy sauce, including reduced-sodium soy sauce. Steak sauce. Canned and packaged gravies. Fish sauce. Oyster sauce. Cocktail sauce. Store-bought  horseradish. Ketchup. Mustard. Meat flavorings and tenderizers. Bouillon cubes. Hot sauces. Pre-made or packaged marinades. Pre-made or packaged taco seasonings. Relishes. Regular salad dressings. Other foods Salted popcorn and pretzels. The items listed above may not be all the foods and drinks you should avoid. Talk to a dietitian to learn more. Where to find more information National Heart, Lung, and Blood Institute (NHLBI): nhlbi.nih.gov American Heart Association (AHA): heart.org Academy of Nutrition and Dietetics: eatright.org National Kidney Foundation (NKF): kidney.org This information is not intended to replace advice given to you by your health care provider. Make sure you discuss any questions you have with your health care provider. Document Revised: 12/17/2022 Document Reviewed: 12/17/2022 Elsevier Patient Education  2024 Elsevier Inc.  

## 2023-07-26 DIAGNOSIS — M1712 Unilateral primary osteoarthritis, left knee: Secondary | ICD-10-CM | POA: Diagnosis not present

## 2023-07-29 ENCOUNTER — Ambulatory Visit: Payer: Medicare PPO | Admitting: Nurse Practitioner

## 2023-07-29 ENCOUNTER — Encounter: Payer: Self-pay | Admitting: Nurse Practitioner

## 2023-07-29 VITALS — BP 126/77 | HR 72 | Temp 97.5°F | Ht 66.2 in | Wt 211.2 lb

## 2023-07-29 DIAGNOSIS — M8588 Other specified disorders of bone density and structure, other site: Secondary | ICD-10-CM | POA: Diagnosis not present

## 2023-07-29 DIAGNOSIS — E782 Mixed hyperlipidemia: Secondary | ICD-10-CM | POA: Diagnosis not present

## 2023-07-29 DIAGNOSIS — E6609 Other obesity due to excess calories: Secondary | ICD-10-CM

## 2023-07-29 DIAGNOSIS — Z6831 Body mass index (BMI) 31.0-31.9, adult: Secondary | ICD-10-CM | POA: Diagnosis not present

## 2023-07-29 DIAGNOSIS — I10 Essential (primary) hypertension: Secondary | ICD-10-CM | POA: Diagnosis not present

## 2023-07-29 MED ORDER — ROSUVASTATIN CALCIUM 10 MG PO TABS: 10.0000 mg | ORAL_TABLET | Freq: Every day | ORAL | 4 refills | Status: DC

## 2023-07-29 MED ORDER — AMLODIPINE BESYLATE 5 MG PO TABS: 5.0000 mg | ORAL_TABLET | Freq: Every day | ORAL | 4 refills | Status: DC

## 2023-07-29 MED ORDER — LOSARTAN POTASSIUM 100 MG PO TABS: 100.0000 mg | ORAL_TABLET | Freq: Every day | ORAL | 4 refills | Status: DC

## 2023-07-29 NOTE — Assessment & Plan Note (Signed)
Ongoing. Continue Rosuvastatin at current dosing and adjust as needed.  Lipid panel today.

## 2023-07-29 NOTE — Assessment & Plan Note (Signed)
BMI 33.88.  Recommended eating smaller high protein, low fat meals more frequently and exercising 30 mins a day 5 times a week with a goal of 10-15lb weight loss in the next 3 months. Patient voiced their understanding and motivation to adhere to these recommendations.

## 2023-07-29 NOTE — Progress Notes (Addendum)
BP 126/77   Pulse 72   Temp (!) 97.5 F (36.4 C) (Oral)   Ht 5' 6.2" (1.681 m)   Wt 211 lb 3.2 oz (95.8 kg)   LMP  (LMP Unknown)   SpO2 97%   BMI 33.88 kg/m    Subjective:    Patient ID: Mary Wood, female    DOB: Jan 12, 1956, 67 y.o.   MRN: [id number]  HPI: ZUJEY Wood is a 67 y.o. female  Chief Complaint  Patient presents with   Hyperlipidemia   Hypertension   Osteopenia   HYPERTENSION Continues on Losartan 100 MG and Amlodipine 5 MG daily + Crestor 10 MG daily. Continues to exercise at home in pool. Hypertension status: controlled  Satisfied with current treatment? yes Duration of hypertension: chronic BP monitoring frequency:  two times a week BP range: <130/80 range BP medication side effects:  no Medication compliance: good compliance Aspirin: no Recurrent headaches: no  Visual changes: no Palpitations: no Dyspnea: no Chest pain: no Lower extremity edema: no Dizzy/lightheaded: no  The 10-year ASCVD risk score (Arnett DK, et al., 2019) is: 10.4%   Values used to calculate the score:     Age: 19 years     Sex: Female     Is Non-Hispanic African American: No     Diabetic: No     Tobacco smoker: No     Systolic Blood Pressure: 126 mmHg     Is BP treated: Yes     HDL Cholesterol: 55 mg/dL     Total Cholesterol: 168 mg/dL  OSTEOPENIA Last DEXA 4/0/98 with a T-score of -1.9. Continues on Vitamin D and calcium. Satisfied with current treatment?: yes Adequate calcium & vitamin D: yes Weight bearing exercises: yes   BMI Metric Follow Up - 07/29/23 0916       BMI Metric Follow Up-Please document annually   BMI Metric Follow Up Nutrition counseling             Relevant past medical, surgical, family and social history reviewed and updated as indicated. Interim medical history since our last visit reviewed. Allergies and medications reviewed and updated.  Review of Systems  Constitutional:  Negative for activity change, appetite change,  diaphoresis, fatigue and fever.  Respiratory:  Negative for cough, chest tightness and shortness of breath.   Cardiovascular:  Negative for chest pain, palpitations and leg swelling.  Gastrointestinal: Negative.   Neurological: Negative.   Psychiatric/Behavioral: Negative.     Per HPI unless specifically indicated above     Objective:    BP 126/77   Pulse 72   Temp (!) 97.5 F (36.4 C) (Oral)   Ht 5' 6.2" (1.681 m)   Wt 211 lb 3.2 oz (95.8 kg)   LMP  (LMP Unknown)   SpO2 97%   BMI 33.88 kg/m   Wt Readings from Last 3 Encounters:  07/29/23 211 lb 3.2 oz (95.8 kg)  03/22/23 212 lb 6.4 oz (96.3 kg)  01/28/23 211 lb (95.7 kg)    Physical Exam Vitals and nursing note reviewed.  Constitutional:      General: She is awake. She is not in acute distress.    Appearance: She is well-developed and well-groomed. She is obese. She is not ill-appearing or toxic-appearing.  HENT:     Head: Normocephalic.     Right Ear: Hearing and external ear normal.     Left Ear: Hearing and external ear normal.  Eyes:     General: Lids are normal.  Right eye: No discharge.        Left eye: No discharge.     Conjunctiva/sclera: Conjunctivae normal.     Pupils: Pupils are equal, round, and reactive to light.  Neck:     Thyroid: No thyromegaly.     Vascular: No carotid bruit.  Cardiovascular:     Rate and Rhythm: Normal rate and regular rhythm.     Heart sounds: Normal heart sounds. No murmur heard.    No gallop.  Pulmonary:     Effort: Pulmonary effort is normal. No accessory muscle usage or respiratory distress.     Breath sounds: Normal breath sounds.  Abdominal:     General: Bowel sounds are normal. There is no distension.     Palpations: Abdomen is soft.     Tenderness: There is no abdominal tenderness.  Musculoskeletal:     Cervical back: Normal range of motion and neck supple.     Right lower leg: No edema.     Left lower leg: No edema.  Lymphadenopathy:     Cervical: No  cervical adenopathy.  Skin:    General: Skin is warm and dry.  Neurological:     Mental Status: She is alert and oriented to person, place, and time.     Deep Tendon Reflexes: Reflexes are normal and symmetric.     Reflex Scores:      Brachioradialis reflexes are 2+ on the right side and 2+ on the left side.      Patellar reflexes are 2+ on the right side and 2+ on the left side. Psychiatric:        Attention and Perception: Attention normal.        Mood and Affect: Mood normal.        Speech: Speech normal.        Behavior: Behavior normal. Behavior is cooperative.        Thought Content: Thought content normal.    Results for orders placed or performed during the hospital encounter of 03/30/23  Surgical pathology  Result Value Ref Range   SURGICAL PATHOLOGY      SURGICAL PATHOLOGY CASE: [id number] PATIENT: Mary Wood Surgical Pathology Report     Specimen Submitted: A. Breast, right  Clinical History: Palpable mass in right breast 10:00; heart clip      DIAGNOSIS: A.  BREAST, RIGHT 10:00; ULTRASOUND-GUIDED BIOPSY: - PSEUDOANGIOMATOUS STROMAL HYPERPLASIA. - NEGATIVE FOR ATYPIA AND MALIGNANCY.  GROSS DESCRIPTION: A. Labeled: Ultrasound biopsy right breast 10:00 mass Received: Formalin Time/date in fixative: Collected and placed in formalin 8:22 AM on 03/30/2023 Cold ischemic time: Less than 1 minute Total fixation time: Approximately 9 hours Core pieces: 3 cores and 2 additional fragments Size: Range from 0.8-1.5 cm in length and 0.2 cm in diameter Description: Received are cores and fragments of white-yellow fibrofatty tissue.  The additional fragments range from 0.3 to 0.4 cm in greatest dimension. Ink color: Blue Entirely submitted in cassettes 1-2 with 2 cores in cassette 1 and 1 core with the remaining  fragments in cassette 2.  RB 03/30/2023   Final Diagnosis performed by Elijah Birk, MD.   Electronically signed 03/31/2023 8:18:33AM The  electronic signature indicates that the named Attending Pathologist has evaluated the specimen Technical component performed at [facility], [address], [place], [place] [address] Lab: [phone] Dir:  Schimke, MD, MMM  Professional component performed at [facility], [facility], [address] [place], [place], [place] [address] Lab: [phone] Dir: Beryle Quant, MD  Assessment & Plan:   Problem List Items Addressed This Visit       Cardiovascular and Mediastinum   Hypertension - Primary    Chronic, stable.  BP at goal in office today. Will continue current medication regimen and adjust as needed.  Continue to monitor BP at home regularly and focus on DASH diet.  Refills sent.  LABS: CMP.  Return in 6 months.      Relevant Medications   amLODipine (NORVASC) 5 MG tablet   losartan (COZAAR) 100 MG tablet   rosuvastatin (CRESTOR) 10 MG tablet   Other Relevant Orders   Comprehensive metabolic panel     Musculoskeletal and Integument   Osteopenia of lumbar spine    Chronic, ongoing with some improvement recent scan.  Return in 6 months.  Vit D level at physical next year.  Next scan May 2027.        Other   Mixed hyperlipidemia    Ongoing. Continue Rosuvastatin at current dosing and adjust as needed.  Lipid panel today.      Relevant Medications   amLODipine (NORVASC) 5 MG tablet   losartan (COZAAR) 100 MG tablet   rosuvastatin (CRESTOR) 10 MG tablet   Other Relevant Orders   Comprehensive metabolic panel   Lipid Panel w/o Chol/HDL Ratio   Obesity    BMI 33.88. Recommended eating smaller high protein, low fat meals more frequently and exercising 30 mins a day 5 times a week with a goal of 10-15lb weight loss in the next 3 months. Patient voiced their understanding and motivation to adhere to these recommendations.         Follow up plan: Return in about 6 months (around 01/29/2024) for Annual Physical -- after 01/29/24.

## 2023-07-29 NOTE — Assessment & Plan Note (Signed)
Chronic, stable.  BP at goal in office today. Will continue current medication regimen and adjust as needed.  Continue to monitor BP at home regularly and focus on DASH diet.  Refills sent.  LABS: CMP.  Return in 6 months.

## 2023-07-29 NOTE — Assessment & Plan Note (Addendum)
Chronic, ongoing with some improvement recent scan.  Return in 6 months.  Vit D level at physical next year.  Next scan May 2027.

## 2023-07-30 LAB — COMPREHENSIVE METABOLIC PANEL
ALT: 15 IU/L (ref 0–32)
AST: 12 IU/L (ref 0–40)
Albumin: 4.3 g/dL (ref 3.9–4.9)
Alkaline Phosphatase: 78 IU/L (ref 44–121)
BUN/Creatinine Ratio: 28 (ref 12–28)
BUN: 19 mg/dL (ref 8–27)
Bilirubin Total: 0.4 mg/dL (ref 0.0–1.2)
CO2: 24 mmol/L (ref 20–29)
Calcium: 9.9 mg/dL (ref 8.7–10.3)
Chloride: 104 mmol/L (ref 96–106)
Creatinine, Ser: 0.69 mg/dL (ref 0.57–1.00)
Globulin, Total: 2.9 g/dL (ref 1.5–4.5)
Glucose: 101 mg/dL — ABNORMAL HIGH (ref 70–99)
Potassium: 5.2 mmol/L (ref 3.5–5.2)
Sodium: 139 mmol/L (ref 134–144)
Total Protein: 7.2 g/dL (ref 6.0–8.5)
eGFR: 94 mL/min/{1.73_m2} (ref >59)

## 2023-07-30 LAB — LIPID PANEL W/O CHOL/HDL RATIO
Cholesterol, Total: 110 mg/dL (ref 100–199)
HDL: 53 mg/dL (ref >39)
LDL Chol Calc (NIH): 44 mg/dL (ref 0–99)
Triglycerides: 59 mg/dL (ref 0–149)
VLDL Cholesterol Cal: 13 mg/dL (ref 5–40)

## 2023-07-30 NOTE — Progress Notes (Signed)
Contacted via MyChart   Good morning Mary Wood, your labs have returned and overall look great.  Glucose mildly elevated, we will monitor this.  Kidney function, creatinine and eGFR, remains normal, as is liver function, AST and ALT. Cholesterol levels great with statin on board.  Continue all current medications.  Any questions? Keep being amazing!!  Thank you for allowing me to participate in your care.  I appreciate you. Kindest regards, Takoya Jonas

## 2023-08-02 DIAGNOSIS — M1712 Unilateral primary osteoarthritis, left knee: Secondary | ICD-10-CM | POA: Diagnosis not present

## 2023-08-12 ENCOUNTER — Encounter: Payer: Self-pay | Admitting: Nurse Practitioner

## 2023-08-12 ENCOUNTER — Other Ambulatory Visit: Payer: Self-pay | Admitting: Nurse Practitioner

## 2023-08-12 DIAGNOSIS — Z9889 Other specified postprocedural states: Secondary | ICD-10-CM

## 2023-09-14 ENCOUNTER — Encounter: Payer: Self-pay | Admitting: Nurse Practitioner

## 2023-09-14 DIAGNOSIS — M1712 Unilateral primary osteoarthritis, left knee: Secondary | ICD-10-CM | POA: Diagnosis not present

## 2023-09-15 ENCOUNTER — Other Ambulatory Visit: Payer: Self-pay | Admitting: Physician Assistant

## 2023-09-15 DIAGNOSIS — M2392 Unspecified internal derangement of left knee: Secondary | ICD-10-CM

## 2023-09-22 ENCOUNTER — Ambulatory Visit
Admission: RE | Admit: 2023-09-22 | Discharge: 2023-09-22 | Disposition: A | Discharge status: Home / Self Care | Payer: Medicare PPO | Source: Ambulatory Visit | Attending: Physician Assistant | Admitting: Physician Assistant

## 2023-09-22 DIAGNOSIS — M2392 Unspecified internal derangement of left knee: Secondary | ICD-10-CM | POA: Insufficient documentation

## 2023-09-22 DIAGNOSIS — Q686 Discoid meniscus: Secondary | ICD-10-CM | POA: Diagnosis not present

## 2023-09-22 DIAGNOSIS — M1712 Unilateral primary osteoarthritis, left knee: Secondary | ICD-10-CM | POA: Diagnosis not present

## 2023-09-22 DIAGNOSIS — M25462 Effusion, left knee: Secondary | ICD-10-CM | POA: Diagnosis not present

## 2023-09-22 DIAGNOSIS — Z96651 Presence of right artificial knee joint: Secondary | ICD-10-CM | POA: Diagnosis not present

## 2023-09-24 ENCOUNTER — Encounter: Payer: Self-pay | Admitting: Nurse Practitioner

## 2023-09-24 NOTE — Telephone Encounter (Signed)
Called and scheduled patient on 09/30/2023 @ 1:00 pm.

## 2023-09-28 DIAGNOSIS — M1712 Unilateral primary osteoarthritis, left knee: Secondary | ICD-10-CM | POA: Diagnosis not present

## 2023-09-30 ENCOUNTER — Ambulatory Visit
Admission: RE | Admit: 2023-09-30 | Discharge: 2023-09-30 | Disposition: A | Discharge status: Home / Self Care | Payer: Medicare PPO | Source: Ambulatory Visit | Attending: Nurse Practitioner | Admitting: Nurse Practitioner

## 2023-09-30 ENCOUNTER — Ambulatory Visit (INDEPENDENT_AMBULATORY_CARE_PROVIDER_SITE_OTHER): Payer: Medicare PPO

## 2023-09-30 DIAGNOSIS — N6489 Other specified disorders of breast: Secondary | ICD-10-CM | POA: Insufficient documentation

## 2023-09-30 DIAGNOSIS — Z23 Encounter for immunization: Secondary | ICD-10-CM | POA: Diagnosis not present

## 2023-09-30 DIAGNOSIS — N62 Hypertrophy of breast: Secondary | ICD-10-CM | POA: Diagnosis not present

## 2023-09-30 DIAGNOSIS — R92321 Mammographic fibroglandular density, right breast: Secondary | ICD-10-CM | POA: Diagnosis not present

## 2023-09-30 DIAGNOSIS — Z9889 Other specified postprocedural states: Secondary | ICD-10-CM

## 2023-10-02 DIAGNOSIS — M1712 Unilateral primary osteoarthritis, left knee: Secondary | ICD-10-CM | POA: Insufficient documentation

## 2023-11-23 DIAGNOSIS — M1712 Unilateral primary osteoarthritis, left knee: Secondary | ICD-10-CM | POA: Diagnosis not present

## 2023-12-06 NOTE — H&P (Signed)
ORTHOPAEDIC HISTORY & PHYSICAL Raenette Rover, [place] - 11/23/2023 9:45 AM EST Formatting of this note is different from the original. NAME: Mary Wood H&P Date: 11/23/2023 Procedure Date: 12/20/2023  Chief Complaint: left knee pain  HPI Mary Wood is a 67 y.o. female who has severe knee pain. Patient has had a significant history of left knee pain. She states that she has had years of knee issues and discomfort. A little over 19 months ago, the patient had a left knee arthroscopy with a partial medial meniscectomy and chondroplasty. 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 This is where she localizes most of her discomfort. She denies having any significant swelling, locking or giving way of the knee. She states that the pain is aggravated with any prolonged standing or weightbearing activity. It has begun to affect her ability to ambulate as well as perform her ADLs. She has failed conservative treatment including Tylenol, glucosamine, activity modification, intra-articular corticosteroid injections (her last injection performed on 09/14/2023), and viscosupplementation injections. She does not currently utilize any ambulatory assistive devices. She has requested operative intervention for relief of her DJD symptoms. Of note, patient also has a right total knee arthroplasty that was performed by Dr. Ernest Pine in September of 2023. She denies any cardiac or pulmonary history. Denies any history of diabetes. No previous issues with clots or DVTs. Patient is a retired Engineer, civil (consulting).  Of note, patient states that she already has a walker and Polar Care unit at home and does not need these when she is discharged from the hospital. Instructed the patient to bring her Polar Care with her to the hospital on the day of surgery.  Social Hx: Patient is retired. Is a former Engineer, civil (consulting). Patient denies any illicit drug use, nicotine or tobacco use. She does  admit to alcohol consumption. States that it is socially and depends on how much she goes out to have a night with friends, but states she will drink wine, beer and liquor drinks ranging from 2-7 drinks a week.  Medications & Allergies Allergies: Allergies Allergen Reactions Allergenic Extracts Other (See Comments) Stuffy nose itchy eyes Cat Dander Itching Itching of eyes Cat/Feline Products Other (See Comments) Stuffy nose itchy eyes  Home Medicines: Current Outpatient Medications on File Prior to Visit Medication Sig Dispense Refill acetaminophen (TYLENOL) 650 MG ER tablet Take 650 mg by mouth every 8 (eight) hours as needed amLODIPine (NORVASC) 5 MG tablet 5 mg once daily amoxicillin (AMOXIL) 500 MG capsule Take 4 capsules 1 hour before the dental procedure 4 capsule 2 calcium carbonate (CALCIUM 600 ORAL) Take 3 tablets by mouth once daily cholecalciferol (VITAMIN D3) 1000 unit tablet Take 2,000 Units by mouth once daily glucosamine/chondr su A sod (OSTEO BI-FLEX ORAL) Take 2 tablets by mouth once daily losartan (COZAAR) 100 MG tablet 100 mg once daily rosuvastatin (CRESTOR) 10 MG tablet Take 10 mg by mouth once daily scopolamine (TRANSDERM-SCOP) 1 mg over 3 days patch Place 1 patch onto the skin every third day  No current facility-administered medications on file prior to visit.  Medical / Surgical History  Past Medical History: Diagnosis Date Chicken pox Hypertension Shingles   Past Surgical History: Procedure Laterality Date LEFT ELBOW SURGERY Left 06/25/2011 Right knee arthroscopy, partial medial meniscectomy, and chondroplasty 05/07/2021 Dr Ernest Pine Left knee arthroscopy, partial medial meniscectomy, and chondroplasty 04/08/2022 Dr Ernest Pine Right total knee arthroplasty using computer-assisted navigation 08/31/2022 Dr Ernest Pine TONSILLECTOMY WISDOM TEETH   Physical Exam  Ht:170.2 cm (5\' 7" ) Wt:98.6 kg (217 lb 6.4 oz) BMI: Body mass index is 34.05  kg/m.  General/Constitutional: No apparent distress: well-nourished and well developed. Eyes: Pupils equal, round with synchronous movement. Lymphatic: No palpable adenopathy. Respiratory: Patient has good chest rise and fall with inspiration and expiration. All lung fields are clear to auscultation bilaterally. There is no Rales, rhonchi or wheezes appreciated. Cardiovascular: Upon auscultation there is a regular rate and rhythm without any murmurs, rubs, gallops or heaves appreciated. There does not appear to be any swelling down the lower extremities. Posterior tibial pulses appreciated bilaterally, 2+. Integumentary: No impressive skin lesions present, except as noted in detailed exam. Neuro/Psych: Normal mood and affect, oriented to person, place and time. Musculoskeletal: see exam below  Left knee exam Upon inspection of the patient's left knee there does not appear to be any skin changes, open abrasions, swelling or redness. There is a neutral alignment. Upon palpation, the patient reports having pain along the medial aspect of their knee. Patient has full extension actively with ROM, and able to flex back to 120 with mild pain. Varus and valgus stress testing shows a stable knee. The patella tracks well within the femoral groove from flexion into extension. Anterior and posterior drawer testing negative. There is fair to good quadriceps tone without any significant atrophy. Patient is neurovascularly intact down their lower extremity to all dermatomes. Posterior tibial pulses appreciated 2+.  Imaging left Knee Imaging:  A series of x-rays were ordered and interpreted the patient's left knee. These images included AP weightbearing, lateral and sunrise views. Upon inspection of the AP view, there is noticeable loss of joint space margin appreciated along the medial compartment with 80 to 90% narrowing. There is apparent subchondral changes appreciated, no major osteophyte formation present.  The overall alignment is relative neutral. There does not appear to be any fractures, lytic lesions or gross deformities appreciated on films.  Assesment and Plan Knee DJD  I have recommended that Carmela Hurt undergo left total knee replacement. Consents has been signed. The risks, benefits, prognosis and alternatives including but not limited to DVT, PE, infection, neurovascular injury, failure of the procedure and death were explained to the patient and she is willing to proceed with surgery as described to her by myself. Plan will be for post operative admission of at least 1 midnight for pain control and PT. She will be managed with DVT prophylaxis, antibiotics preoperatively for 24 hours and aggressive in patient rehab.  Pre, intra and post op interventions were discussed. Patient has good understanding  Medication Reconciliation was performed. Discussed cessation of NSAIDs, vitamins and supplements.  A total of 55 minutes was spent reviewing patient's charts, medical reconciliation, discussing/educating the patient about surgical interventions, and answering any questions provided by the patient.  JOSHUA Kendrick Fries, PA [facility] 11/23/2023  Electronically signed by Raenette Rover, PA at 11/23/2023 12:56 PM EST

## 2023-12-06 NOTE — Discharge Instructions (Signed)

## 2023-12-14 ENCOUNTER — Inpatient Hospital Stay: Admission: RE | Admit: 2023-12-14 | Payer: Medicare PPO | Source: Ambulatory Visit

## 2023-12-17 ENCOUNTER — Encounter: Payer: Self-pay | Admitting: Orthopedic Surgery

## 2023-12-17 ENCOUNTER — Encounter
Admission: RE | Admit: 2023-12-17 | Discharge: 2023-12-17 | Disposition: A | Discharge status: Home / Self Care | Payer: Medicare PPO | Source: Ambulatory Visit | Attending: Orthopedic Surgery | Admitting: Orthopedic Surgery

## 2023-12-17 ENCOUNTER — Other Ambulatory Visit: Payer: Self-pay

## 2023-12-17 DIAGNOSIS — I10 Essential (primary) hypertension: Secondary | ICD-10-CM | POA: Insufficient documentation

## 2023-12-17 DIAGNOSIS — Z01810 Encounter for preprocedural cardiovascular examination: Secondary | ICD-10-CM | POA: Diagnosis not present

## 2023-12-17 DIAGNOSIS — Z01818 Encounter for other preprocedural examination: Secondary | ICD-10-CM | POA: Diagnosis not present

## 2023-12-17 DIAGNOSIS — Z01812 Encounter for preprocedural laboratory examination: Secondary | ICD-10-CM

## 2023-12-17 DIAGNOSIS — M1711 Unilateral primary osteoarthritis, right knee: Secondary | ICD-10-CM | POA: Diagnosis not present

## 2023-12-17 DIAGNOSIS — M8588 Other specified disorders of bone density and structure, other site: Secondary | ICD-10-CM | POA: Insufficient documentation

## 2023-12-17 HISTORY — DX: Headache, unspecified: R51.9

## 2023-12-17 HISTORY — DX: Zoster without complications: B02.9

## 2023-12-17 HISTORY — DX: Varicella without complication: B01.9

## 2023-12-17 LAB — CBC
HCT: 38.1 % (ref 36.0–46.0)
Hemoglobin: 12.8 g/dL (ref 12.0–15.0)
MCH: 31.5 pg (ref 26.0–34.0)
MCHC: 33.6 g/dL (ref 30.0–36.0)
MCV: 93.8 fL (ref 80.0–100.0)
Platelets: 363 10*3/uL (ref 150–400)
RBC: 4.06 MIL/uL (ref 3.87–5.11)
RDW: 12.2 % (ref 11.5–15.5)
WBC: 6.1 10*3/uL (ref 4.0–10.5)
nRBC: 0 % (ref 0.0–0.2)

## 2023-12-17 LAB — SURGICAL PCR SCREEN
MRSA, PCR: NEGATIVE
Staphylococcus aureus: NEGATIVE

## 2023-12-17 LAB — URINALYSIS, ROUTINE W REFLEX MICROSCOPIC
Bilirubin Urine: NEGATIVE
Glucose, UA: NEGATIVE mg/dL
Hgb urine dipstick: NEGATIVE
Ketones, ur: NEGATIVE mg/dL
Leukocytes,Ua: NEGATIVE
Nitrite: NEGATIVE
Protein, ur: NEGATIVE mg/dL
Specific Gravity, Urine: 1.008 (ref 1.005–1.030)
pH: 6 (ref 5.0–8.0)

## 2023-12-17 LAB — C-REACTIVE PROTEIN: CRP: 0.8 mg/dL (ref <1.0)

## 2023-12-17 LAB — COMPREHENSIVE METABOLIC PANEL
ALT: 20 U/L (ref 0–44)
AST: 22 U/L (ref 15–41)
Albumin: 4.3 g/dL (ref 3.5–5.0)
Alkaline Phosphatase: 62 U/L (ref 38–126)
Anion gap: 14 (ref 5–15)
BUN: 16 mg/dL (ref 8–23)
CO2: 25 mmol/L (ref 22–32)
Calcium: 9 mg/dL (ref 8.9–10.3)
Chloride: 102 mmol/L (ref 98–111)
Creatinine, Ser: 0.75 mg/dL (ref 0.44–1.00)
GFR, Estimated: >60 mL/min (ref >60)
Glucose, Bld: 112 mg/dL — ABNORMAL HIGH (ref 70–99)
Potassium: 3.4 mmol/L — ABNORMAL LOW (ref 3.5–5.1)
Sodium: 141 mmol/L (ref 135–145)
Total Bilirubin: 0.4 mg/dL (ref 0.0–1.2)
Total Protein: 7.8 g/dL (ref 6.5–8.1)

## 2023-12-17 LAB — SEDIMENTATION RATE: Sed Rate: 11 mm/h (ref 0–30)

## 2023-12-17 NOTE — Patient Instructions (Addendum)
 Your procedure is scheduled on: Monday Jan 6/ 2025  Report to the Registration Desk on the 1st floor of the [facility] To find out your arrival time, please call [phone] between 1PM - 3PM on: done If your arrival time is 6:00 am, do not arrive before that time as the [facility] entrance doors do not open until 6:00 am.  REMEMBER: Instructions that are not followed completely may result in serious medical risk, up to and including death; or upon the discretion of your surgeon and anesthesiologist your surgery may need to be rescheduled.  Do not eat food after midnight the night before surgery.  No gum chewing or hard candies.  You may however, drink CLEAR liquids up to 2 hours before you are scheduled to arrive for your surgery. Do not drink anything within 2 hours of your scheduled arrival time.  Clear liquids include: - water   - apple juice without pulp - gatorade (not RED colors) - black coffee or tea (Do NOT add milk or creamers to the coffee or tea) Do NOT drink anything that is not on this list.   In addition, your doctor has ordered for you to drink the provided:  Ensure Pre-Surgery Clear Carbohydrate Drink   Drinking this carbohydrate drink up to two hours before surgery helps to reduce insulin resistance and improve patient outcomes. Please complete drinking 2 hours before scheduled arrival time.  One week prior to surgery: Stop Anti-inflammatories (NSAIDS) such as Advil, Aleve , Ibuprofen, Motrin, Naproxen , Naprosyn  and Aspirin  based products such as Excedrin, Goody's Powder, BC Powder. Stop ANY OVER THE COUNTER supplements until after surgery.  You may however, continue to take Tylenol  if needed for pain up until the day of surgery.   Continue taking all of your other prescription medications up until the day of surgery.  ON THE DAY OF SURGERY ONLY TAKE THESE MEDICATIONS WITH SIPS OF WATER :  amLODipine  (NORVASC )    No Alcohol for 24 hours before or after  surgery.  No Smoking including e-cigarettes for 24 hours before surgery.  No chewable tobacco products for at least 6 hours before surgery.  No nicotine patches on the day of surgery.  Do not use any recreational drugs for at least a week (preferably 2 weeks) before your surgery.  Please be advised that the combination of cocaine and anesthesia may have negative outcomes, up to and including death. If you test positive for cocaine, your surgery will be cancelled.  On the morning of surgery brush your teeth with toothpaste and water , you may rinse your mouth with mouthwash if you wish. Do not swallow any toothpaste or mouthwash.  Use CHG Soap or wipes as directed on instruction sheet.  Do not wear jewelry, make-up, hairpins, clips or nail polish.  For welded (permanent) jewelry: bracelets, anklets, waist bands, etc.  Please have this removed prior to surgery.  If it is not removed, there is a chance that hospital personnel will need to cut it off on the day of surgery.  Do not wear lotions, powders, or perfumes.   Do not shave body hair from the neck down 48 hours before surgery.  Contact lenses, hearing aids and dentures may not be worn into surgery.  Do not bring valuables to the hospital. [facility] is not responsible for any missing/lost belongings or valuables.    Notify your doctor if there is any change in your medical condition (cold, fever, infection).  Wear comfortable clothing (specific to your surgery type) to  the hospital.  After surgery, you can help prevent lung complications by doing breathing exercises.  Take deep breaths and cough every 1-2 hours. Your doctor may order a device called an Incentive Spirometer to help you take deep breaths. If you are being admitted to the hospital overnight, leave your suitcase in the car. After surgery it may be brought to your room.  In case of increased patient census, it may be necessary for you, the patient, to continue  your postoperative care in the Same Day Surgery department.  If you are being discharged the day of surgery, you will not be allowed to drive home. You will need a responsible individual to drive you home and stay with you for 24 hours after surgery.    Please call the Pre-admissions Testing Dept. at [phone] if you have any questions about these instructions.  Surgery Visitation Policy:  Patients having surgery or a procedure may have two visitors.  Children under the age of 85 must have an adult with them who is not the patient.  Inpatient Visitation:    Visiting hours are 7 a.m. to 8 p.m. Up to four visitors are allowed at one time in a patient room. The visitors may rotate out with other people during the day.  One visitor age 19 or older may stay with the patient overnight and must be in the room by 8 p.m.     How to Use an Incentive Spirometer  An incentive spirometer is a tool that measures how well you are filling your lungs with each breath. Learning to take long, deep breaths using this tool can help you keep your lungs clear and active. This may help to reverse or lessen your chance of developing breathing (pulmonary) problems, especially infection. You may be asked to use a spirometer: After a surgery. If you have a lung problem or a history of smoking. After a long period of time when you have been unable to move or be active. If the spirometer includes an indicator to show the highest number that you have reached, your health care provider or respiratory therapist will help you set a goal. Keep a log of your progress as told by your health care provider. What are the risks? Breathing too quickly may cause dizziness or cause you to pass out. Take your time so you do not get dizzy or light-headed. If you are in pain, you may need to take pain medicine before doing incentive spirometry. It is harder to take a deep breath if you are having pain. How to use your  incentive spirometer  Sit up on the edge of your bed or on a chair. Hold the incentive spirometer so that it is in an upright position. Before you use the spirometer, breathe out normally. Place the mouthpiece in your mouth. Make sure your lips are closed tightly around it. Breathe in slowly and as deeply as you can through your mouth, causing the piston or the ball to rise toward the top of the chamber. Hold your breath for 3-5 seconds, or for as long as possible. If the spirometer includes a coach indicator, use this to guide you in breathing. Slow down your breathing if the indicator goes above the marked areas. Remove the mouthpiece from your mouth and breathe out normally. The piston or ball will return to the bottom of the chamber. Rest for a few seconds, then repeat the steps 10 or more times. Take your time and take a few  normal breaths between deep breaths so that you do not get dizzy or light-headed. Do this every 1-2 hours when you are awake. If the spirometer includes a goal marker to show the highest number you have reached (best effort), use this as a goal to work toward during each repetition. After each set of 10 deep breaths, cough a few times. This will help to make sure that your lungs are clear. If you have an incision on your chest or abdomen from surgery, place a pillow or a rolled-up towel firmly against the incision when you cough. This can help to reduce pain while taking deep breaths and coughing. General tips When you are able to get out of bed: Walk around often. Continue to take deep breaths and cough in order to clear your lungs. Keep using the incentive spirometer until your health care provider says it is okay to stop using it. If you have been in the hospital, you may be told to keep using the spirometer at home. Contact a health care provider if: You are having difficulty using the spirometer. You have trouble using the spirometer as often as instructed. Your  pain medicine is not giving enough relief for you to use the spirometer as told. You have a fever. Get help right away if: You develop shortness of breath. You develop a cough with bloody mucus from the lungs. You have fluid or blood coming from an incision site after you cough. Summary An incentive spirometer is a tool that can help you learn to take long, deep breaths to keep your lungs clear and active. You may be asked to use a spirometer after a surgery, if you have a lung problem or a history of smoking, or if you have been inactive for a long period of time. Use your incentive spirometer as instructed every 1-2 hours while you are awake. If you have an incision on your chest or abdomen, place a pillow or a rolled-up towel firmly against your incision when you cough. This will help to reduce pain. Get help right away if you have shortness of breath, you cough up bloody mucus, or blood comes from your incision when you cough. This information is not intended to replace advice given to you by your health care provider. Make sure you discuss any questions you have with your health care provider. Document Revised: 02/19/2020 Document Reviewed: 02/19/2020 Elsevier Patient Education  2023 [employer] Inc[employer]    Preoperative Educational Videos for Total Hip, Knee and Shoulder Replacements  To better prepare for surgery, please view our videos that explain the physical activity and discharge planning required to have the best surgical recovery at [facility].  [email or web address]  Questions? Call [phone] or email [email or web address]        Pre-operative 5 CHG Bath Instructions   You can play a key role in reducing the risk of infection after surgery. Your skin needs to be as free of germs as possible. You can reduce the number of germs on your skin by washing with CHG (chlorhexidine  gluconate)  soap before surgery. CHG is an antiseptic soap that kills germs and continues to kill germs even after washing.   DO NOT use if you have an allergy to chlorhexidine /CHG or antibacterial soaps. If your skin becomes reddened or irritated, stop using the CHG and notify one of our RNs at [phone].   Please shower with the CHG soap starting 4 days before surgery using the following schedule:  Please keep in mind the following:  DO NOT shave, including legs and underarms, starting the day of your first shower.   You may shave your face at any point before/day of surgery.  Place clean sheets on your bed the day you start using CHG soap. Use a clean washcloth (not used since being washed) for each shower. DO NOT sleep with pets once you start using the CHG.   CHG Shower Instructions:  If you choose to wash your hair and private area, wash first with your normal shampoo/soap.  After you use shampoo/soap, rinse your hair and body thoroughly to remove shampoo/soap residue.  Turn the water  OFF and apply about 3 tablespoons (45 ml) of CHG soap to a CLEAN washcloth.  Apply CHG soap ONLY FROM YOUR NECK DOWN TO YOUR TOES (washing for 3-5 minutes)  DO NOT use CHG soap on face, private areas, open wounds, or sores.  Pay special attention to the area where your surgery is being performed.  If you are having back surgery, having someone wash your back for you may be helpful. Wait 2 minutes after CHG soap is applied, then you may rinse off the CHG soap.  Pat dry with a clean towel  Put on clean clothes/pajamas   If you choose to wear lotion, please use ONLY the CHG-compatible lotions on the back of this paper.     Additional instructions for the day of surgery: DO NOT APPLY any lotions, deodorants, cologne, or perfumes.   Put on clean/comfortable clothes.  Brush your teeth.  Ask your nurse before applying any prescription medications to the skin.      CHG Compatible Lotions   Aveeno  Moisturizing lotion  Cetaphil Moisturizing Cream  Cetaphil Moisturizing Lotion  Clairol Herbal Essence Moisturizing Lotion, Dry Skin  Clairol Herbal Essence Moisturizing Lotion, Extra Dry Skin  Clairol Herbal Essence Moisturizing Lotion, Normal Skin  Curel Age Defying Therapeutic Moisturizing Lotion with Alpha Hydroxy  Curel Extreme Care Body Lotion  Curel Soothing Hands Moisturizing Hand Lotion  Curel Therapeutic Moisturizing Cream, Fragrance-Free  Curel Therapeutic Moisturizing Lotion, Fragrance-Free  Curel Therapeutic Moisturizing Lotion, Original Formula  Eucerin Daily Replenishing Lotion  Eucerin Dry Skin Therapy Plus Alpha Hydroxy Crme  Eucerin Dry Skin Therapy Plus Alpha Hydroxy Lotion  Eucerin Original Crme  Eucerin Original Lotion  Eucerin Plus Crme Eucerin Plus Lotion  Eucerin TriLipid Replenishing Lotion  Keri Anti-Bacterial Hand Lotion  Keri Deep Conditioning Original Lotion Dry Skin Formula Softly Scented  Keri Deep Conditioning Original Lotion, Fragrance Free Sensitive Skin Formula  Keri Lotion Fast Absorbing Fragrance Free Sensitive Skin Formula  Keri Lotion Fast Absorbing Softly Scented Dry Skin Formula  Keri Original Lotion  Keri Skin Renewal Lotion Keri Silky Smooth Lotion  Keri Silky Smooth Sensitive Skin Lotion  Nivea Body Creamy Conditioning Oil  Nivea Body Extra Enriched Teacher, Adult Education Moisturizing Lotion Nivea Crme  Nivea Skin Firming Lotion  NutraDerm 30 Skin Lotion  NutraDerm Skin Lotion  NutraDerm Therapeutic Skin Cream  NutraDerm Therapeutic Skin Lotion  ProShield Protective Hand Cream  Provon moisturizing lotion

## 2023-12-19 ENCOUNTER — Encounter: Payer: Self-pay | Admitting: Orthopedic Surgery

## 2023-12-19 MED ORDER — CEFAZOLIN SODIUM-DEXTROSE 2-4 GM/100ML-% IV SOLN
2.0000 g | INTRAVENOUS | Status: AC
  Administered 2023-12-20: 2 g via INTRAVENOUS

## 2023-12-19 MED ORDER — DEXAMETHASONE SODIUM PHOSPHATE 10 MG/ML IJ SOLN
8.0000 mg | Freq: Once | INTRAMUSCULAR | Status: AC
  Administered 2023-12-20: 8 mg via INTRAVENOUS

## 2023-12-19 MED ORDER — GABAPENTIN 300 MG PO CAPS
300.0000 mg | ORAL_CAPSULE | Freq: Once | ORAL | Status: AC
  Administered 2023-12-20: 300 mg via ORAL

## 2023-12-19 MED ORDER — CELECOXIB 200 MG PO CAPS
400.0000 mg | ORAL_CAPSULE | Freq: Once | ORAL | Status: AC
  Administered 2023-12-20: 400 mg via ORAL

## 2023-12-19 MED ORDER — TRANEXAMIC ACID-NACL 1000-0.7 MG/100ML-% IV SOLN
1000.0000 mg | INTRAVENOUS | Status: AC
  Administered 2023-12-20: 1000 mg via INTRAVENOUS

## 2023-12-19 MED ORDER — CHLORHEXIDINE GLUCONATE 4 % EX SOLN: 60.0000 mL | Freq: Once | CUTANEOUS | Status: DC

## 2023-12-20 ENCOUNTER — Observation Stay: Payer: Medicare PPO

## 2023-12-20 ENCOUNTER — Other Ambulatory Visit: Payer: Self-pay

## 2023-12-20 ENCOUNTER — Ambulatory Visit: Payer: Medicare PPO | Admitting: Urgent Care

## 2023-12-20 ENCOUNTER — Observation Stay
Admission: RE | Admit: 2023-12-20 | Discharge: 2023-12-21 | Disposition: A | Discharge status: Home / Self Care | Payer: Medicare PPO | Attending: Orthopedic Surgery | Admitting: Orthopedic Surgery

## 2023-12-20 ENCOUNTER — Encounter
Admission: RE | Disposition: A | Discharge status: Home / Self Care | Payer: Self-pay | Source: Home / Self Care | Attending: Orthopedic Surgery

## 2023-12-20 ENCOUNTER — Encounter: Payer: Self-pay | Admitting: Orthopedic Surgery

## 2023-12-20 DIAGNOSIS — M8588 Other specified disorders of bone density and structure, other site: Secondary | ICD-10-CM

## 2023-12-20 DIAGNOSIS — M1712 Unilateral primary osteoarthritis, left knee: Principal | ICD-10-CM | POA: Insufficient documentation

## 2023-12-20 DIAGNOSIS — Z96652 Presence of left artificial knee joint: Secondary | ICD-10-CM

## 2023-12-20 DIAGNOSIS — I10 Essential (primary) hypertension: Secondary | ICD-10-CM | POA: Diagnosis not present

## 2023-12-20 DIAGNOSIS — Z471 Aftercare following joint replacement surgery: Secondary | ICD-10-CM | POA: Diagnosis not present

## 2023-12-20 DIAGNOSIS — Z96651 Presence of right artificial knee joint: Secondary | ICD-10-CM | POA: Diagnosis not present

## 2023-12-20 DIAGNOSIS — Z01812 Encounter for preprocedural laboratory examination: Secondary | ICD-10-CM

## 2023-12-20 DIAGNOSIS — M1711 Unilateral primary osteoarthritis, right knee: Principal | ICD-10-CM

## 2023-12-20 DIAGNOSIS — Z79899 Other long term (current) drug therapy: Secondary | ICD-10-CM | POA: Insufficient documentation

## 2023-12-20 HISTORY — PX: KNEE ARTHROPLASTY: SHX992

## 2023-12-20 HISTORY — DX: Unilateral primary osteoarthritis, left knee: M17.12

## 2023-12-20 SURGERY — ARTHROPLASTY, KNEE, TOTAL, USING IMAGELESS COMPUTER-ASSISTED NAVIGATION
Anesthesia: Spinal | Site: Knee | Laterality: Left

## 2023-12-20 MED ORDER — LACTATED RINGERS IV SOLN: INTRAVENOUS | Status: DC

## 2023-12-20 MED ORDER — CEFAZOLIN SODIUM-DEXTROSE 2-4 GM/100ML-% IV SOLN
INTRAVENOUS | Status: AC
  Filled 2023-12-20: qty 100

## 2023-12-20 MED ORDER — ENSURE PRE-SURGERY PO LIQD
296.0000 mL | Freq: Once | ORAL | Status: AC
  Administered 2023-12-20: 296 mL via ORAL
  Filled 2023-12-20: qty 296

## 2023-12-20 MED ORDER — PROPOFOL 1000 MG/100ML IV EMUL
INTRAVENOUS | Status: AC
Start: 2023-12-20 — End: ?
  Filled 2023-12-20: qty 100

## 2023-12-20 MED ORDER — CELECOXIB 200 MG PO CAPS
200.0000 mg | ORAL_CAPSULE | Freq: Two times a day (BID) | ORAL | Status: DC
Start: 2023-12-20 — End: 2023-12-21
  Administered 2023-12-20 – 2023-12-21 (×2): 200 mg via ORAL

## 2023-12-20 MED ORDER — ACETAMINOPHEN 10 MG/ML IV SOLN
INTRAVENOUS | Status: AC
  Filled 2023-12-20: qty 100

## 2023-12-20 MED ORDER — METOCLOPRAMIDE HCL 10 MG PO TABS
10.0000 mg | ORAL_TABLET | Freq: Three times a day (TID) | ORAL | Status: DC
  Administered 2023-12-20 – 2023-12-21 (×3): 10 mg via ORAL

## 2023-12-20 MED ORDER — PROPOFOL 10 MG/ML IV BOLUS
INTRAVENOUS | Status: DC | PRN
  Administered 2023-12-20: 5 mg via INTRAVENOUS

## 2023-12-20 MED ORDER — MENTHOL 3 MG MT LOZG: 1.0000 | LOZENGE | OROMUCOSAL | Status: DC | PRN

## 2023-12-20 MED ORDER — PHENOL 1.4 % MT LIQD: 1.0000 | OROMUCOSAL | Status: DC | PRN

## 2023-12-20 MED ORDER — PHENYLEPHRINE 80 MCG/ML (10ML) SYRINGE FOR IV PUSH (FOR BLOOD PRESSURE SUPPORT)
PREFILLED_SYRINGE | INTRAVENOUS | Status: AC
  Filled 2023-12-20: qty 10

## 2023-12-20 MED ORDER — FLEET ENEMA RE ENEM: 1.0000 | ENEMA | Freq: Once | RECTAL | Status: DC | PRN

## 2023-12-20 MED ORDER — MIDAZOLAM HCL 2 MG/2ML IJ SOLN
INTRAMUSCULAR | Status: AC
  Filled 2023-12-20: qty 2

## 2023-12-20 MED ORDER — SODIUM CHLORIDE 0.9 % IV SOLN
INTRAVENOUS | Status: DC | PRN
  Administered 2023-12-20: 60 mL

## 2023-12-20 MED ORDER — LOSARTAN POTASSIUM 50 MG PO TABS
ORAL_TABLET | ORAL | Status: AC
  Filled 2023-12-20: qty 2

## 2023-12-20 MED ORDER — STERILE WATER FOR IRRIGATION IR SOLN
Status: DC | PRN
Start: 2023-12-20 — End: 2023-12-20
  Administered 2023-12-20: 1000 mL

## 2023-12-20 MED ORDER — SURGIRINSE WOUND IRRIGATION SYSTEM - OPTIME
TOPICAL | Status: DC | PRN
  Administered 2023-12-20: 450 mL via TOPICAL

## 2023-12-20 MED ORDER — PHENYLEPHRINE HCL-NACL 20-0.9 MG/250ML-% IV SOLN
INTRAVENOUS | Status: DC | PRN
  Administered 2023-12-20: 25 ug/min via INTRAVENOUS

## 2023-12-20 MED ORDER — PROPOFOL 500 MG/50ML IV EMUL
INTRAVENOUS | Status: DC | PRN
  Administered 2023-12-20: 75 ug/kg/min via INTRAVENOUS

## 2023-12-20 MED ORDER — BISACODYL 10 MG RE SUPP: 10.0000 mg | Freq: Every day | RECTAL | Status: DC | PRN

## 2023-12-20 MED ORDER — OXYCODONE HCL 5 MG PO TABS
5.0000 mg | ORAL_TABLET | ORAL | Status: DC | PRN
  Administered 2023-12-20 – 2023-12-21 (×4): 5 mg via ORAL

## 2023-12-20 MED ORDER — GABAPENTIN 300 MG PO CAPS
ORAL_CAPSULE | ORAL | Status: AC
  Filled 2023-12-20: qty 1

## 2023-12-20 MED ORDER — ACETAMINOPHEN 325 MG PO TABS: 325.0000 mg | ORAL_TABLET | Freq: Four times a day (QID) | ORAL | Status: DC | PRN

## 2023-12-20 MED ORDER — ALUM & MAG HYDROXIDE-SIMETH 200-200-20 MG/5ML PO SUSP: 30.0000 mL | ORAL | Status: DC | PRN

## 2023-12-20 MED ORDER — DIPHENHYDRAMINE HCL 12.5 MG/5ML PO ELIX
12.5000 mg | ORAL_SOLUTION | ORAL | Status: DC | PRN
Start: 2023-12-20 — End: 2023-12-21

## 2023-12-20 MED ORDER — OXYCODONE HCL 5 MG PO TABS
ORAL_TABLET | ORAL | Status: AC
  Filled 2023-12-20: qty 1

## 2023-12-20 MED ORDER — ASPIRIN 81 MG PO CHEW
81.0000 mg | CHEWABLE_TABLET | Freq: Two times a day (BID) | ORAL | Status: DC
  Administered 2023-12-21: 81 mg via ORAL

## 2023-12-20 MED ORDER — DEXAMETHASONE SODIUM PHOSPHATE 10 MG/ML IJ SOLN
INTRAMUSCULAR | Status: AC
  Filled 2023-12-20: qty 1

## 2023-12-20 MED ORDER — PANTOPRAZOLE SODIUM 40 MG PO TBEC
DELAYED_RELEASE_TABLET | ORAL | Status: AC
  Filled 2023-12-20: qty 1

## 2023-12-20 MED ORDER — ACETAMINOPHEN 10 MG/ML IV SOLN
INTRAVENOUS | Status: DC | PRN
  Administered 2023-12-20: 1000 mg via INTRAVENOUS

## 2023-12-20 MED ORDER — CELECOXIB 200 MG PO CAPS
ORAL_CAPSULE | ORAL | Status: AC
  Filled 2023-12-20: qty 2

## 2023-12-20 MED ORDER — TRANEXAMIC ACID-NACL 1000-0.7 MG/100ML-% IV SOLN
1000.0000 mg | Freq: Once | INTRAVENOUS | Status: AC
  Administered 2023-12-20: 1000 mg via INTRAVENOUS

## 2023-12-20 MED ORDER — AMLODIPINE BESYLATE 5 MG PO TABS
5.0000 mg | ORAL_TABLET | Freq: Every day | ORAL | Status: DC
  Administered 2023-12-21: 5 mg via ORAL

## 2023-12-20 MED ORDER — CHLORHEXIDINE GLUCONATE 0.12 % MT SOLN
OROMUCOSAL | Status: AC
  Filled 2023-12-20: qty 15

## 2023-12-20 MED ORDER — ONDANSETRON HCL 4 MG/2ML IJ SOLN: 4.0000 mg | Freq: Four times a day (QID) | INTRAMUSCULAR | Status: DC | PRN

## 2023-12-20 MED ORDER — PANTOPRAZOLE SODIUM 40 MG PO TBEC
40.0000 mg | DELAYED_RELEASE_TABLET | Freq: Two times a day (BID) | ORAL | Status: DC
  Administered 2023-12-20 (×2): 40 mg via ORAL
  Filled 2023-12-20: qty 1

## 2023-12-20 MED ORDER — SENNOSIDES-DOCUSATE SODIUM 8.6-50 MG PO TABS
1.0000 | ORAL_TABLET | Freq: Two times a day (BID) | ORAL | Status: DC
  Administered 2023-12-20: 1 via ORAL

## 2023-12-20 MED ORDER — TRANEXAMIC ACID-NACL 1000-0.7 MG/100ML-% IV SOLN
INTRAVENOUS | Status: AC
Start: 2023-12-20 — End: ?
  Filled 2023-12-20: qty 100

## 2023-12-20 MED ORDER — LOSARTAN POTASSIUM 50 MG PO TABS
100.0000 mg | ORAL_TABLET | Freq: Every day | ORAL | Status: DC
  Administered 2023-12-20 – 2023-12-21 (×2): 100 mg via ORAL

## 2023-12-20 MED ORDER — MAGNESIUM HYDROXIDE 400 MG/5ML PO SUSP
30.0000 mL | Freq: Every day | ORAL | Status: DC
  Administered 2023-12-20: 30 mL via ORAL

## 2023-12-20 MED ORDER — PHENYLEPHRINE 80 MCG/ML (10ML) SYRINGE FOR IV PUSH (FOR BLOOD PRESSURE SUPPORT)
PREFILLED_SYRINGE | INTRAVENOUS | Status: DC | PRN
  Administered 2023-12-20: 160 ug via INTRAVENOUS
  Administered 2023-12-20: 80 ug via INTRAVENOUS
  Administered 2023-12-20 (×2): 160 ug via INTRAVENOUS

## 2023-12-20 MED ORDER — FENTANYL CITRATE (PF) 100 MCG/2ML IJ SOLN: 25.0000 ug | INTRAMUSCULAR | Status: DC | PRN

## 2023-12-20 MED ORDER — TRANEXAMIC ACID-NACL 1000-0.7 MG/100ML-% IV SOLN
INTRAVENOUS | Status: AC
  Filled 2023-12-20: qty 100

## 2023-12-20 MED ORDER — CHLORHEXIDINE GLUCONATE 0.12 % MT SOLN
15.0000 mL | Freq: Once | OROMUCOSAL | Status: AC
  Administered 2023-12-20: 15 mL via OROMUCOSAL

## 2023-12-20 MED ORDER — CELECOXIB 200 MG PO CAPS
ORAL_CAPSULE | ORAL | Status: AC
  Filled 2023-12-20: qty 1

## 2023-12-20 MED ORDER — METOCLOPRAMIDE HCL 10 MG PO TABS
ORAL_TABLET | ORAL | Status: AC
  Filled 2023-12-20: qty 1

## 2023-12-20 MED ORDER — BUPIVACAINE HCL (PF) 0.5 % IJ SOLN
INTRAMUSCULAR | Status: DC | PRN
  Administered 2023-12-20: 3 mL

## 2023-12-20 MED ORDER — PROPOFOL 1000 MG/100ML IV EMUL
INTRAVENOUS | Status: AC
  Filled 2023-12-20: qty 100

## 2023-12-20 MED ORDER — SODIUM CHLORIDE 0.9 % IR SOLN
Status: DC | PRN
  Administered 2023-12-20: 3000 mL

## 2023-12-20 MED ORDER — TRAMADOL HCL 50 MG PO TABS
ORAL_TABLET | ORAL | Status: AC
  Filled 2023-12-20: qty 1

## 2023-12-20 MED ORDER — TRAMADOL HCL 50 MG PO TABS
50.0000 mg | ORAL_TABLET | ORAL | Status: DC | PRN
  Administered 2023-12-20 – 2023-12-21 (×3): 50 mg via ORAL

## 2023-12-20 MED ORDER — MAGNESIUM HYDROXIDE 400 MG/5ML PO SUSP
ORAL | Status: AC
  Filled 2023-12-20: qty 30

## 2023-12-20 MED ORDER — CEFAZOLIN SODIUM-DEXTROSE 2-4 GM/100ML-% IV SOLN
2.0000 g | Freq: Four times a day (QID) | INTRAVENOUS | Status: AC
  Administered 2023-12-20 (×2): 2 g via INTRAVENOUS

## 2023-12-20 MED ORDER — BUPIVACAINE HCL (PF) 0.25 % IJ SOLN
INTRAMUSCULAR | Status: DC | PRN
  Administered 2023-12-20: 60 mL

## 2023-12-20 MED ORDER — HYDROMORPHONE HCL 1 MG/ML IJ SOLN: 0.5000 mg | INTRAMUSCULAR | Status: DC | PRN

## 2023-12-20 MED ORDER — ONDANSETRON HCL 4 MG PO TABS: 4.0000 mg | ORAL_TABLET | Freq: Four times a day (QID) | ORAL | Status: DC | PRN

## 2023-12-20 MED ORDER — MIDAZOLAM HCL 5 MG/5ML IJ SOLN
INTRAMUSCULAR | Status: DC | PRN
  Administered 2023-12-20: 2 mg via INTRAVENOUS

## 2023-12-20 MED ORDER — ACETAMINOPHEN 10 MG/ML IV SOLN
1000.0000 mg | Freq: Four times a day (QID) | INTRAVENOUS | Status: AC
  Administered 2023-12-20 – 2023-12-21 (×4): 1000 mg via INTRAVENOUS

## 2023-12-20 MED ORDER — SENNOSIDES-DOCUSATE SODIUM 8.6-50 MG PO TABS
ORAL_TABLET | ORAL | Status: AC
  Filled 2023-12-20: qty 1

## 2023-12-20 MED ORDER — FERROUS SULFATE 325 (65 FE) MG PO TABS
325.0000 mg | ORAL_TABLET | Freq: Two times a day (BID) | ORAL | Status: DC
  Administered 2023-12-20 – 2023-12-21 (×2): 325 mg via ORAL

## 2023-12-20 MED ORDER — ROSUVASTATIN CALCIUM 20 MG PO TABS
ORAL_TABLET | ORAL | Status: AC
  Filled 2023-12-20: qty 1

## 2023-12-20 MED ORDER — SODIUM CHLORIDE 0.9 % IV SOLN
INTRAVENOUS | Status: DC
Start: 2023-12-20 — End: 2023-12-21

## 2023-12-20 MED ORDER — ROSUVASTATIN CALCIUM 20 MG PO TABS
10.0000 mg | ORAL_TABLET | Freq: Every day | ORAL | Status: DC
  Administered 2023-12-20 – 2023-12-21 (×2): 10 mg via ORAL

## 2023-12-20 MED ORDER — OXYCODONE HCL 5 MG PO TABS
10.0000 mg | ORAL_TABLET | ORAL | Status: DC | PRN
  Administered 2023-12-21: 10 mg via ORAL

## 2023-12-20 MED ORDER — FERROUS SULFATE 325 (65 FE) MG PO TABS
ORAL_TABLET | ORAL | Status: AC
  Filled 2023-12-20: qty 1

## 2023-12-20 MED ORDER — BUPIVACAINE HCL (PF) 0.5 % IJ SOLN
INTRAMUSCULAR | Status: AC
  Filled 2023-12-20: qty 10

## 2023-12-20 SURGICAL SUPPLY — 66 items
ATTUNE PSFEM LTSZ6 NARCEM KNEE (Femur) IMPLANT
ATTUNE PSRP INSR SZ6 5 KNEE (Insert) IMPLANT
BASE TIBIAL ROT PLAT SZ 5 KNEE (Knees) IMPLANT
BATTERY INSTRU NAVIGATION (MISCELLANEOUS) ×8 IMPLANT
BIT DRILL QUICK REL 1/8 2PK SL (BIT) ×2 IMPLANT
BLADE CLIPPER SURG (BLADE) IMPLANT
BLADE SAW 70X12.5 (BLADE) ×2 IMPLANT
BLADE SAW 90X13X1.19 OSCILLAT (BLADE) ×2 IMPLANT
BLADE SAW 90X25X1.19 OSCILLAT (BLADE) ×2 IMPLANT
BRUSH SCRUB EZ PLAIN DRY (MISCELLANEOUS) ×2 IMPLANT
CEMENT HV SMART SET (Cement) IMPLANT
CUFF TRNQT CYL 24X4X16.5-23 (TOURNIQUET CUFF) IMPLANT
CUFF TRNQT CYL 30X4X21-28X (TOURNIQUET CUFF) IMPLANT
DRAPE SHEET LG 3/4 BI-LAMINATE (DRAPES) ×2 IMPLANT
DRSG AQUACEL AG ADV 3.5X14 (GAUZE/BANDAGES/DRESSINGS) ×2 IMPLANT
DRSG MEPILEX SACRM 8.7X9.8 (GAUZE/BANDAGES/DRESSINGS) ×2 IMPLANT
DRSG TEGADERM 4X4.75 (GAUZE/BANDAGES/DRESSINGS) ×2 IMPLANT
DRSG XEROFORM 1X8 (GAUZE/BANDAGES/DRESSINGS) IMPLANT
DURAPREP 26ML APPLICATOR (WOUND CARE) ×4 IMPLANT
ELECT CAUTERY BLADE 6.4 (BLADE) ×2 IMPLANT
ELECT REM PT RETURN 9FT ADLT (ELECTROSURGICAL) ×1
ELECTRODE REM PT RTRN 9FT ADLT (ELECTROSURGICAL) ×2 IMPLANT
EVACUATOR 1/8 PVC DRAIN (DRAIN) ×2 IMPLANT
EX-PIN ORTHOLOCK NAV 4X150 (PIN) ×4 IMPLANT
GAUZE XEROFORM 1X8 LF (GAUZE/BANDAGES/DRESSINGS) ×2 IMPLANT
GLOVE BIOGEL M STRL SZ7.5 (GLOVE) ×12 IMPLANT
GLOVE SURG UNDER POLY LF SZ8 (GLOVE) ×4 IMPLANT
GOWN STRL REUS W/ TWL LRG LVL3 (GOWN DISPOSABLE) ×2 IMPLANT
GOWN STRL REUS W/ TWL XL LVL3 (GOWN DISPOSABLE) ×2 IMPLANT
GOWN TOGA ZIPPER T7+ PEEL AWAY (MISCELLANEOUS) ×2 IMPLANT
HOLDER FOLEY CATH W/STRAP (MISCELLANEOUS) ×2 IMPLANT
HOOD PEEL AWAY T7 (MISCELLANEOUS) ×2 IMPLANT
IV NS IRRIG 3000ML ARTHROMATIC (IV SOLUTION) ×2 IMPLANT
KIT TURNOVER KIT A (KITS) ×2 IMPLANT
KNIFE SCULPS 14X20 (INSTRUMENTS) ×2 IMPLANT
MANIFOLD NEPTUNE II (INSTRUMENTS) ×4 IMPLANT
NDL SPNL 20GX3.5 QUINCKE YW (NEEDLE) ×4 IMPLANT
NEEDLE SPNL 20GX3.5 QUINCKE YW (NEEDLE) ×2
PACK TOTAL KNEE (MISCELLANEOUS) ×2 IMPLANT
PAD ABD DERMACEA PRESS 5X9 (GAUZE/BANDAGES/DRESSINGS) ×4 IMPLANT
PAD ARMBOARD 7.5X6 YLW CONV (MISCELLANEOUS) ×6 IMPLANT
PAD WRAPON POLAR KNEE (MISCELLANEOUS) IMPLANT
PATELLA MEDIAL ATTUN 35MM KNEE (Knees) IMPLANT
PENCIL SMOKE EVACUATOR COATED (MISCELLANEOUS) ×2 IMPLANT
PIN DRILL FIX HALF THREAD (BIT) ×4 IMPLANT
PIN FIXATION 1/8DIA X 3INL (PIN) ×2 IMPLANT
PULSAVAC PLUS IRRIG FAN TIP (DISPOSABLE) ×1
SOLUTION IRRIG SURGIPHOR (IV SOLUTION) ×2 IMPLANT
SPONGE DRAIN TRACH 4X4 STRL 2S (GAUZE/BANDAGES/DRESSINGS) ×2 IMPLANT
STAPLER SKIN PROX 35W (STAPLE) ×2 IMPLANT
STOCKINETTE BIAS CUT 6 980064 (GAUZE/BANDAGES/DRESSINGS) IMPLANT
STOCKINETTE STRL BIAS CUT 8X4 (MISCELLANEOUS) ×2 IMPLANT
STRAP TIBIA SHORT (MISCELLANEOUS) ×2 IMPLANT
SUCTION TUBE FRAZIER 10FR DISP (SUCTIONS) ×2 IMPLANT
SUT VIC AB 0 CT1 36 (SUTURE) ×2 IMPLANT
SUT VIC AB 1 CT1 36 (SUTURE) ×4 IMPLANT
SUT VIC AB 2-0 CT2 27 (SUTURE) ×2 IMPLANT
SYR 30ML LL (SYRINGE) ×4 IMPLANT
TIBIAL BASE ROT PLAT SZ 5 KNEE (Knees) ×1 IMPLANT
TIP FAN IRRIG PULSAVAC PLUS (DISPOSABLE) ×2 IMPLANT
TOWEL OR 17X26 4PK STRL BLUE (TOWEL DISPOSABLE) ×2 IMPLANT
TOWER CARTRIDGE SMART MIX (DISPOSABLE) ×2 IMPLANT
TRAP FLUID SMOKE EVACUATOR (MISCELLANEOUS) ×2 IMPLANT
TRAY FOLEY MTR SLVR 16FR STAT (SET/KITS/TRAYS/PACK) ×2 IMPLANT
WATER STERILE IRR 1000ML POUR (IV SOLUTION) ×2 IMPLANT
WRAPON POLAR PAD KNEE (MISCELLANEOUS) ×1

## 2023-12-20 NOTE — Plan of Care (Signed)
  Problem: Pain Management: Goal: General experience of comfort will improve Outcome: Progressing   Problem: Safety: Goal: Ability to remain free from injury will improve Outcome: Progressing

## 2023-12-20 NOTE — Progress Notes (Signed)
 Patient is not able to walk the distance required to go the bathroom, or he/she is unable to safely negotiate stairs required to access the bathroom.  A 3in1 BSC will alleviate this problem   Amenda Duclos P. Angie Fava M.D.

## 2023-12-20 NOTE — Transfer of Care (Signed)
 Immediate Anesthesia Transfer of Care Note  Patient: Mary Wood  Procedure(s) Performed: COMPUTER ASSISTED TOTAL KNEE ARTHROPLASTY (Left: Knee)  Patient Location: PACU  Anesthesia Type:Spinal  Level of Consciousness: awake, alert , and oriented  Airway & Oxygen Therapy: Patient Spontanous Breathing and Patient connected to face mask oxygen  Post-op Assessment: Report given to RN and Post -op Vital signs reviewed and stable  Post vital signs: Reviewed and stable  Last Vitals:  Vitals Value Taken Time  BP 109/61 12/20/23 1036  Temp 36.2 C 12/20/23 1037  Pulse 79 12/20/23 1039  Resp 12 12/20/23 1039  SpO2 100 % 12/20/23 1039  Vitals shown include unfiled device data.  Last Pain:  Vitals:   12/20/23 0624  TempSrc: Temporal  PainSc: 4          Complications: No notable events documented.

## 2023-12-20 NOTE — Evaluation (Signed)
 Physical Therapy Evaluation Patient Details Name: Mary Wood MRN: [id number] DOB: August 18, 1956 Today's Date: 12/20/2023  History of Present Illness  Mary Wood is a 67yoF who comes to [facility] on 12/20/23 for elective Left TKA c Dr. Mardee. Pt reports Rt TKA in 2023 with favorable outcome. PMH: HTN, shingles, Lt elbow surgery (2012),  Clinical Impression  Pt awake in bed on entry, agreeable to session- pain reported at 7/10 on arrival and a little worse after session (RN made aware). Pt able to demonstrate SLR without knee buckle on left side. Pt able to perform bed mobility, transfers, and AMB with RW without any physical assistance and without any education cues today. Stairs and HEP education are deferred to POD1. Pt AMB 167ft without much difficulty and with advanced gait pattern and speed compared to age-matched norm values post op. Pt left up in recliner at end of session, mild to moderate Lt knee TKE stretch reported prior to any heel elevation. Will continue to follow.       If plan is discharge home, recommend the following: A little help with walking and/or transfers;Assist for transportation;Assistance with cooking/housework;Help with stairs or ramp for entrance   Can travel by private vehicle        Equipment Recommendations None recommended by PT (Pt has all recommended DME already)  Recommendations for Other Services       Functional Status Assessment Patient has had a recent decline in their functional status and demonstrates the ability to make significant improvements in function in a reasonable and predictable amount of time.     Precautions / Restrictions Precautions Precautions: Fall Restrictions Weight Bearing Restrictions Per Provider Order: Yes LLE Weight Bearing Per Provider Order: Weight bearing as tolerated      Mobility  Bed Mobility Overal bed mobility: Modified Independent             General bed mobility comments: able to manage her shorts donning  with minA from husband, then move to Lt EOB without physical assit; no dizziness, no obvious pain limitations.    Transfers Overall transfer level: Needs assistance Equipment used: Rolling walker (2 wheels) Transfers: Sit to/from Stand Sit to Stand: Contact guard assist, Supervision           General transfer comment: no cues needed    Ambulation/Gait Ambulation/Gait assistance: Contact guard assist Gait Distance (Feet): 120 Feet Assistive device: Rolling walker (2 wheels) Gait Pattern/deviations: Step-through pattern, Step-to pattern, WFL(Within Functional Limits) Gait velocity: 0.22m/s     General Gait Details: safe use of RW, able to transition to a step-through 2-point gait pattern after 54ft without cues from [employer] Stairs: Yes (will defer to POD1; pt desires to perform a full flight upon DC to get to 2nd story bedroom)          Wheelchair Mobility     Tilt Bed    Modified Rankin (Stroke Patients Only)       Balance Overall balance assessment: Modified Independent                                           Pertinent Vitals/Pain Pain Assessment Pain Assessment: 0-10 Pain Score: 7  Pain Location: Lt knee proximal the patella Pain Intervention(s): Limited activity within patient's tolerance, Monitored during session, Premedicated before session    Home Living Family/patient expects to be discharged to:: Private residence Living Arrangements:  Spouse/significant other;Children (DTR and 16yo grandson) Available Help at Discharge: Family Type of Home: House Home Access: Stairs to enter Entrance Stairs-Rails: Right Entrance Stairs-Number of Steps: 4 from garage Alternate Level Stairs-Number of Steps: 14 to get to 2nd floor bed room, full bath on main level; Home Layout: Two level Home Equipment: Rolling Walker (2 wheels);Crutches;BSC/3in1      Prior Function Prior Level of Function : Independent/Modified Independent                      Extremity/Trunk Assessment                Communication      Cognition                                                General Comments      Exercises Total Joint Exercises Ankle Circles/Pumps: AROM, Both, 5 reps, Supine Quad Sets: AROM, 5 reps, Left, Supine Hip ABduction/ADduction: AROM, Left, 5 reps, Supine Straight Leg Raises: AROM, Left, Supine Goniometric ROM: ~15-70 degrees   Assessment/Plan    PT Assessment Patient needs continued PT services  PT Problem List Decreased range of motion;Decreased strength;Decreased balance;Decreased mobility;Decreased coordination       PT Treatment Interventions DME instruction;Gait training;Stair training;Functional mobility training;Therapeutic activities;Therapeutic exercise;Balance training;Patient/family education    PT Goals (Current goals can be found in the Care Plan section)  Acute Rehab PT Goals Patient Stated Goal: be able to access her bed on 2nd floor PT Goal Formulation: With patient Time For Goal Achievement: 01/03/24 Potential to Achieve Goals: Fair    Frequency BID     Co-evaluation               AM-PAC PT 6 Clicks Mobility  Outcome Measure Help needed turning from your back to your side while in a flat bed without using bedrails?: None Help needed moving from lying on your back to sitting on the side of a flat bed without using bedrails?: None Help needed moving to and from a bed to a chair (including a wheelchair)?: A Little Help needed standing up from a chair using your arms (e.g., wheelchair or bedside chair)?: A Little Help needed to walk in hospital room?: A Little Help needed climbing 3-5 steps with a railing? : A Little 6 Click Score: 20    End of Session Equipment Utilized During Treatment: Gait belt Activity Tolerance: Patient tolerated treatment well;Patient limited by pain Patient left: in chair;with call bell/phone within reach;with  family/visitor present Nurse Communication: Mobility status;Patient requests pain meds PT Visit Diagnosis: Difficulty in walking, not elsewhere classified (R26.2);Muscle weakness (generalized) (M62.81);Other symptoms and signs involving the nervous system (R29.898)    Time: 8587-8553 PT Time Calculation (min) (ACUTE ONLY): 34 min   Charges:   PT Evaluation $PT Eval Moderate Complexity: 1 Mod PT Treatments $Therapeutic Activity: 8-22 mins PT General Charges $$ ACUTE PT VISIT: 1 Visit        3:18 PM, 12/20/23 Peggye JAYSON Linear, PT, DPT Physical Therapist - [facility]  [phone] (ASCOM)    Shaquel Chavous C 12/20/2023, 3:15 PM

## 2023-12-20 NOTE — Anesthesia Procedure Notes (Signed)
 Spinal  Patient location during procedure: OR Start time: 12/20/2023 7:23 AM End time: 12/20/2023 7:25 AM Reason for block: surgical anesthesia Staffing Performed: resident/CRNA  Anesthesiologist: Dario Barter, MD Resident/CRNA: Jaylene Nest, CRNA Performed by: Jaylene Nest, CRNA Authorized by: Dario Barter, MD   Preanesthetic Checklist Completed: patient identified, IV checked, site marked, risks and benefits discussed, surgical consent, monitors and equipment checked, pre-op evaluation and timeout performed Spinal Block Patient position: sitting Prep: DuraPrep Patient monitoring: heart rate, cardiac monitor, continuous pulse ox and blood pressure Approach: midline Location: L3-4 Injection technique: single-shot Needle Needle type: Introducer and Pencan  Needle gauge: 24 G Needle length: 9 cm Assessment Sensory level: T4 Events: CSF return

## 2023-12-20 NOTE — Op Note (Signed)
 OPERATIVE NOTE  DATE OF SURGERY:  12/20/2023  PATIENT NAME:  Mary Wood   DOB: [birth year]  MRN: [id number]  PRE-OPERATIVE DIAGNOSIS: Degenerative arthrosis of the left knee, primary  POST-OPERATIVE DIAGNOSIS:  Same  PROCEDURE:  Left total knee arthroplasty using computer-assisted navigation  SURGEON:  Lynwood SHAUNNA Mardee Mickey. M.D.  ASSISTANT:  Sidra Koyanagi, PA-C (present and scrubbed throughout the case, critical for assistance with exposure, retraction, instrumentation, and closure)  ANESTHESIA: spinal  ESTIMATED BLOOD LOSS: 50 mL  FLUIDS REPLACED: 800 mL of crystalloid  TOURNIQUET TIME: 74 minutes  DRAINS: 2 medium Hemovac drains  SOFT TISSUE RELEASES: Anterior cruciate ligament, posterior cruciate ligament, deep medial collateral ligament, patellofemoral ligament  IMPLANTS UTILIZED: DePuy Attune size 6N posterior stabilized femoral component (cemented), size 5 rotating platform tibial component (cemented), 35 mm medialized dome patella (cemented), and a 5 mm stabilized rotating platform polyethylene insert.  INDICATIONS FOR SURGERY: Mary Wood is a 68 y.o. year old female with a long history of progressive knee pain. X-rays demonstrated severe degenerative changes in tricompartmental fashion. The patient had not seen any significant improvement despite conservative nonsurgical intervention. After discussion of the risks and benefits of surgical intervention, the patient expressed understanding of the risks benefits and agree with plans for total knee arthroplasty.   The risks, benefits, and alternatives were discussed at length including but not limited to the risks of infection, bleeding, nerve injury, stiffness, blood clots, the need for revision surgery, cardiopulmonary complications, among others, and they were willing to proceed.  PROCEDURE IN DETAIL: The patient was brought into the operating room and, after adequate spinal anesthesia was achieved, a tourniquet was placed  on the patient's upper thigh. The patient's knee and leg were cleaned and prepped with alcohol and DuraPrep and draped in the usual sterile fashion. A timeout was performed as per usual protocol. The lower extremity was exsanguinated using an Esmarch, and the tourniquet was inflated to 300 mmHg. An anterior longitudinal incision was made followed by a standard mid vastus approach. The deep fibers of the medial collateral ligament were elevated in a subperiosteal fashion off of the medial flare of the tibia so as to maintain a continuous soft tissue sleeve. The patella was subluxed laterally and the patellofemoral ligament was incised. Inspection of the knee demonstrated severe degenerative changes with full-thickness loss of articular cartilage. Osteophytes were debrided using a rongeur. Anterior and posterior cruciate ligaments were excised. Two 4.0 mm Schanz pins were inserted in the femur and into the tibia for attachment of the array of trackers used for computer-assisted navigation. Hip center was identified using a circumduction technique. Distal landmarks were mapped using the computer. The distal femur and proximal tibia were mapped using the computer. The distal femoral cutting guide was positioned using computer-assisted navigation so as to achieve a 5 distal valgus cut. The femur was sized and it was felt that a size 6N femoral component was appropriate. A size 6 femoral cutting guide was positioned and the anterior cut was performed and verified using the computer. This was followed by completion of the posterior and chamfer cuts. Femoral cutting guide for the central box was then positioned in the center box cut was performed.  Attention was then directed to the proximal tibia. Medial and lateral menisci were excised. The extramedullary tibial cutting guide was positioned using computer-assisted navigation so as to achieve a 0 varus-valgus alignment and 3 posterior slope. The cut was performed  and verified using the computer. The proximal tibia  was sized and it was felt that a size 5 tibial tray was appropriate. Tibial and femoral trials were inserted followed by insertion of a 5 mm polyethylene insert. This allowed for excellent mediolateral soft tissue balancing both in flexion and in full extension. Finally, the patella was cut and prepared so as to accommodate a 35 mm medialized dome patella. A patella trial was placed and the knee was placed through a range of motion with excellent patellar tracking appreciated. The femoral trial was removed after debridement of posterior osteophytes. The central post-hole for the tibial component was reamed followed by insertion of a keel punch. Tibial trials were then removed. Cut surfaces of bone were irrigated with copious amounts of normal saline using pulsatile lavage and then suctioned dry. Polymethylmethacrylate cement was prepared in the usual fashion using a vacuum mixer. Cement was applied to the cut surface of the proximal tibia as well as along the undersurface of a size 5 rotating platform tibial component. Tibial component was positioned and impacted into place. Excess cement was removed using Personal assistant. Cement was then applied to the cut surfaces of the femur as well as along the posterior flanges of the size 6N femoral component. The femoral component was positioned and impacted into place. Excess cement was removed using Personal assistant. A 5 mm polyethylene trial was inserted and the knee was brought into full extension with steady axial compression applied. Finally, cement was applied to the backside of a 35 mm medialized dome patella and the patellar component was positioned and patellar clamp applied. Excess cement was removed using Personal assistant. After adequate curing of the cement, the tourniquet was deflated after a total tourniquet time of 74 minutes. Hemostasis was achieved using electrocautery. The knee was irrigated with copious  amounts of normal saline using pulsatile lavage followed by 450 ml of Surgiphor and then suctioned dry. 20 mL of 1.3% Exparel  and 60 mL of 0.25% Marcaine  in 40 mL of normal saline was injected along the posterior capsule, medial and lateral gutters, and along the arthrotomy site. A 5 mm stabilized rotating platform polyethylene insert was inserted and the knee was placed through a range of motion with excellent mediolateral soft tissue balancing appreciated and excellent patellar tracking noted. 2 medium drains were placed in the wound bed and brought out through separate stab incisions. The medial parapatellar portion of the incision was reapproximated using interrupted sutures of #1 Vicryl. Subcutaneous tissue was approximated in layers using first #0 Vicryl followed #2-0 Vicryl. The skin was approximated with skin staples. A sterile dressing was applied.  The patient tolerated the procedure well and was transported to the recovery room in stable condition.    Ahnna Dungan P. Ashleen Demma, Jr., M.D.

## 2023-12-20 NOTE — Progress Notes (Signed)
 Subjective: 1 Day Post-Op Procedure(s) (LRB): COMPUTER ASSISTED TOTAL KNEE ARTHROPLASTY (Left) Patient reports pain as mild.   Patient seen in rounds with Dr. Mardee. Patient is well, and has had no acute complaints or problems.  Denies any CP, SOB, N/B, fevers or chills. We will continue with therapy today.  Plan is to go Home after hospital stay.  Objective: Vital signs in last 24 hours: Temp:  [97 F (36.1 C)-98.4 F (36.9 C)] 97.9 F (36.6 C) (01/07 0741) Pulse Rate:  [68-87] 71 (01/07 0741) Resp:  [13-21] 14 (01/07 0741) BP: (109-141)/(61-84) 130/69 (01/07 0741) SpO2:  [95 %-99 %] 96 % (01/07 0741)  Intake/Output from previous day:  Intake/Output Summary (Last 24 hours) at 12/21/2023 0907 Last data filed at 12/21/2023 0448 Gross per 24 hour  Intake 400 ml  Output 1515 ml  Net -1115 ml    Intake/Output this shift: No intake/output data recorded.  Labs: No results for input(s): HGB in the last 72 hours.  No results for input(s): WBC, RBC, HCT, PLT in the last 72 hours.  No results for input(s): NA, K, CL, CO2, BUN, CREATININE, GLUCOSE, CALCIUM  in the last 72 hours.  No results for input(s): LABPT, INR in the last 72 hours.  EXAM General - Patient is Alert, Appropriate, and Oriented Extremity - Neurologically intact ABD soft Neurovascular intact Sensation intact distally Intact pulses distally Dorsiflexion/Plantar flexion intact No cellulitis present Compartment soft Dressing - dressing C/D/I and no drainage Motor Function - intact, moving foot and toes well on exam.  Able to plantar and dorsiflex with good strength and range of motion.  Neurovascularly intact all dermatomes on her left lower extremity.  Posterior tibial pulses appreciated. JP Drain pulled without difficulty. Intact  Past Medical History:  Diagnosis Date   Arthritis    knees   Chicken pox    Degenerative disc disease, lumbar    Headache    Hypertension     Osteoporosis    Primary osteoarthritis of left knee    Shingles     Assessment/Plan: 1 Day Post-Op Procedure(s) (LRB): COMPUTER ASSISTED TOTAL KNEE ARTHROPLASTY (Left) Principal Problem:   History of total knee arthroplasty, left  Estimated body mass index is 33.2 kg/m as calculated from the following:   Height as of this encounter: 5' 7 (1.702 m).   Weight as of this encounter: 96.2 kg. Advance diet Up with therapy  Patient will continue to work with physical therapy to pass postoperative PT protocols, ROM and strengthening  Discussed with the patient continuing to utilize Polar Care  Patient will use bone foam in 20-30 minute intervals  Patient will wear TED hose bilaterally to help prevent DVT and clot formation  Discussed the Aquacel bandage.  This bandage will stay in place 7 days postoperatively.  Can be replaced with honeycomb bandages that will be sent home with the patient  Discussed sending the patient home with tramadol  and oxycodone  for as needed pain management.  Patient will also be sent home with Celebrex  to help with swelling and inflammation.  Patient will take an 81 mg aspirin  twice daily for DVT prophylaxis  JP drain removed without difficulty, intact  Weight-Bearing as tolerated to left leg  Patient will follow-up with [facility] in 2 weeks for staple removal and reevaluation  Fonda Koyanagi, PA-C [facility] 12/21/2023, 9:07 AM

## 2023-12-20 NOTE — Discharge Summary (Signed)
 Physician Discharge Summary  Subjective: 1 Day Post-Op Procedure(s) (LRB): COMPUTER ASSISTED TOTAL KNEE ARTHROPLASTY (Left) Patient reports pain as mild.   Patient seen in rounds with Dr. Mardee. Patient is well, and has had no acute complaints or problems.  Denies any CP, SOB, N/B, fevers or chills. We will continue with therapy today.  Patient is ready to go home  Physician Discharge Summary  Patient ID: Mary Wood MRN: [id number] DOB/AGE: [birth year] 68 y.o.  Admit date: 12/20/2023 Discharge date: 12/21/2023  Admission Diagnoses:  Discharge Diagnoses:  Principal Problem:   History of total knee arthroplasty, left   Discharged Condition: good  Hospital Course: Patient presented to the hospital on 12/20/2023 for an elective left total knee arthroplasty performed by Dr. Mardee. Patient was given 1g of TXA and 2g of Ancef  prior to the procedure. she tolerated the procedure well without any complications. See procedural note below for details. Postoperatively, the patient did very well. she was able to pass PT protocols on post-op day one without any issues. JP drain was removed without any difficulty and was intact. she was able to void her bladder without any difficulty. Physical exam was unremarkable. she denies any SOB, CP, N/V, fevers or chills. Vital signs are stable. Patient is stable to discharge home.   PROCEDURE:  Left total knee arthroplasty using computer-assisted navigation   SURGEON:  Lynwood SHAUNNA Mardee Mickey. M.D.   ASSISTANT:  Sidra Koyanagi, PA-C (present and scrubbed throughout the case, critical for assistance with exposure, retraction, instrumentation, and closure)   ANESTHESIA: spinal   ESTIMATED BLOOD LOSS: 50 mL   FLUIDS REPLACED: 800 mL of crystalloid   TOURNIQUET TIME: 74 minutes   DRAINS: 2 medium Hemovac drains   SOFT TISSUE RELEASES: Anterior cruciate ligament, posterior cruciate ligament, deep medial collateral ligament, patellofemoral ligament   IMPLANTS  UTILIZED: DePuy Attune size 6N posterior stabilized femoral component (cemented), size 5 rotating platform tibial component (cemented), 35 mm medialized dome patella (cemented), and a 5 mm stabilized rotating platform polyethylene insert.  Treatments: none  Discharge Exam: Blood pressure 130/69, pulse 71, temperature 97.9 F (36.6 C), resp. rate 14, height 5' 7 (1.702 m), weight 96.2 kg, SpO2 96%.   Disposition: home   Allergies as of 12/21/2023       Reactions   Cat Hair Extract    Stuffy nose itchy eyes        Medication List     STOP taking these medications    ibuprofen 200 MG tablet Commonly known as: ADVIL       TAKE these medications    acetaminophen  650 MG CR tablet Commonly known as: TYLENOL  Take 650 mg by mouth every 6 (six) hours as needed for pain.   amLODipine  5 MG tablet Commonly known as: NORVASC  Take 1 tablet (5 mg total) by mouth daily.   amoxicillin  500 MG capsule Commonly known as: AMOXIL  Take 2,000 mg by mouth See admin instructions. Prior to Dental Procedures   aspirin  81 MG chewable tablet Chew 1 tablet (81 mg total) by mouth 2 (two) times daily.   calcium -vitamin D  500-5 MG-MCG tablet Commonly known as: OSCAL WITH D Take 2 tablets by mouth daily.   celecoxib  200 MG capsule Commonly known as: CELEBREX  Take 1 capsule (200 mg total) by mouth 2 (two) times daily.   cholecalciferol  25 MCG (1000 UNIT) tablet Commonly known as: VITAMIN D3 Take 5,000 Units by mouth daily.   losartan  100 MG tablet Commonly known as: COZAAR  Take 1 tablet (  100 mg total) by mouth daily.   Osteo Bi-Flex/5-Loxin Advanced Tabs Take 2 tablets by mouth daily.   oxyCODONE  5 MG immediate release tablet Commonly known as: Oxy IR/ROXICODONE  Take 1 tablet (5 mg total) by mouth every 4 (four) hours as needed for moderate pain (pain score 4-6) (pain score 4-6).   rosuvastatin  10 MG tablet Commonly known as: Crestor  Take 1 tablet (10 mg total) by mouth daily.                Durable Medical Equipment  (From admission, onward)           Start     Ordered   12/20/23 1240  DME Walker rolling  Once       Question:  Patient needs a walker to treat with the following condition  Answer:  Total knee replacement status   12/20/23 1239   12/20/23 1240  DME Bedside commode  Once       Comments: Patient is not able to walk the distance required to go the bathroom, or he/she is unable to safely negotiate stairs required to access the bathroom.  A 3in1 BSC will alleviate this problem  Question:  Patient needs a bedside commode to treat with the following condition  Answer:  Total knee replacement status   12/20/23 1239            Follow-up Information     Drake Chew, PA-C Follow up on 01/04/2024.   Specialty: Orthopedic Surgery Why: at 9:45am Contact information: [address] [place] [address] [phone]         Mardee Lynwood SQUIBB, MD Follow up on 02/01/2024.   Specialty: Orthopedic Surgery Why: at 2:30pm Contact information: [address] [facility] [place] [address] [phone]                 Signed: Sidra Drake 12/21/2023, 9:08 AM   Objective: Vital signs in last 24 hours: Temp:  [97 F (36.1 C)-98.4 F (36.9 C)] 97.9 F (36.6 C) (01/07 0741) Pulse Rate:  [68-87] 71 (01/07 0741) Resp:  [13-21] 14 (01/07 0741) BP: (109-141)/(61-84) 130/69 (01/07 0741) SpO2:  [95 %-99 %] 96 % (01/07 0741)  Intake/Output from previous day:  Intake/Output Summary (Last 24 hours) at 12/21/2023 0908 Last data filed at 12/21/2023 0448 Gross per 24 hour  Intake 400 ml  Output 1515 ml  Net -1115 ml    Intake/Output this shift: No intake/output data recorded.  Labs: No results for input(s): HGB in the last 72 hours.  No results for input(s): WBC, RBC, HCT, PLT in the last 72 hours.  No results for input(s): NA, K, CL, CO2, BUN, CREATININE, GLUCOSE, CALCIUM  in  the last 72 hours.  No results for input(s): LABPT, INR in the last 72 hours.  EXAM: General - Patient is Alert, Appropriate, and Oriented Extremity - Neurologically intact ABD soft Neurovascular intact Sensation intact distally Intact pulses distally Dorsiflexion/Plantar flexion intact No cellulitis present Compartment soft Dressing - dressing C/D/I and no drainage Motor Function - intact, moving foot and toes well on exam.  Able to plantar and dorsiflex with good strength and range of motion.  Neurovascularly intact all dermatomes on her left lower extremity.  Posterior tibial pulses appreciated. JP Drain pulled without difficulty. Intact  Assessment/Plan: 1 Day Post-Op Procedure(s) (LRB): COMPUTER ASSISTED TOTAL KNEE ARTHROPLASTY (Left) Procedure(s) (LRB): COMPUTER ASSISTED TOTAL KNEE ARTHROPLASTY (Left) Past Medical History:  Diagnosis Date   Arthritis    knees  Chicken pox    Degenerative disc disease, lumbar    Headache    Hypertension    Osteoporosis    Primary osteoarthritis of left knee    Shingles    Principal Problem:   History of total knee arthroplasty, left  Estimated body mass index is 33.2 kg/m as calculated from the following:   Height as of this encounter: 5' 7 (1.702 m).   Weight as of this encounter: 96.2 kg.  Patient will continue to work with physical therapy to pass postoperative PT protocols, ROM and strengthening   Discussed with the patient continuing to utilize Polar Care   Patient will use bone foam in 20-30 minute intervals   Patient will wear TED hose bilaterally to help prevent DVT and clot formation   Discussed the Aquacel bandage.  This bandage will stay in place 7 days postoperatively.  Can be replaced with honeycomb bandages that will be sent home with the patient   Discussed sending the patient home with tramadol  and oxycodone  for as needed pain management.  Patient will also be sent home with Celebrex  to help with  swelling and inflammation.  Patient will take an 81 mg aspirin  twice daily for DVT prophylaxis   JP drain removed without difficulty, intact   Weight-Bearing as tolerated to left leg   Patient will follow-up with [facility] clinic orthopedics in 2 weeks for staple removal and reevaluation  Diet - Regular diet Follow up - in 2 weeks Activity - WBAT Disposition - Home Condition Upon Discharge - Good DVT Prophylaxis - Aspirin  and TED hose  Fonda CHARLENA Koyanagi, PA-C Orthopaedic Surgery 12/21/2023, 9:08 AM

## 2023-12-20 NOTE — Anesthesia Preprocedure Evaluation (Signed)
 Anesthesia Evaluation  Patient identified by MRN, date of birth, ID band Patient awake    Reviewed: Allergy & Precautions, NPO status , Patient's Chart, lab work & pertinent test results  History of Anesthesia Complications Negative for: history of anesthetic complications  Airway Mallampati: II   Neck ROM: Full    Dental  (+) Missing, Chipped, Dental Advidsory Given   Pulmonary neg pulmonary ROS, neg shortness of breath, neg sleep apnea, neg recent URI   Pulmonary exam normal breath sounds clear to auscultation       Cardiovascular hypertension, (-) angina (-) Past MI and (-) CABG Normal cardiovascular exam Rhythm:Regular Rate:Normal  ECG 04/06/22:  Normal sinus rhythm Cannot rule out Anterior infarct , age undetermined   Neuro/Psych Hx alcohol use disorder; last intake 04/05/22    GI/Hepatic negative GI ROS,,,  Endo/Other  Obesity   Renal/GU negative Renal ROS     Musculoskeletal  (+) Arthritis ,    Abdominal   Peds  Hematology negative hematology ROS (+)   Anesthesia Other Findings Past Medical History: No date: Arthritis     Comment:  knees No date: Chicken pox No date: Degenerative disc disease, lumbar No date: Headache No date: Hypertension No date: Osteoporosis No date: Primary osteoarthritis of left knee No date: Shingles   Reproductive/Obstetrics                             Anesthesia Physical Anesthesia Plan  ASA: 2  Anesthesia Plan: Spinal   Post-op Pain Management:    Induction: Intravenous  PONV Risk Score and Plan: 3 and Treatment may vary due to age or medical condition, Propofol  infusion and TIVA  Airway Management Planned: Natural Airway and Simple Face Mask  Additional Equipment:   Intra-op Plan:   Post-operative Plan: Extubation in OR  Informed Consent: I have reviewed the patients History and Physical, chart, labs and discussed the procedure  including the risks, benefits and alternatives for the proposed anesthesia with the patient or authorized representative who has indicated his/her understanding and acceptance.     Dental Advisory Given  Plan Discussed with: Anesthesiologist, CRNA and Surgeon  Anesthesia Plan Comments: (Patient reports no bleeding problems and no anticoagulant use.  Plan for spinal with backup GA  Patient consented for risks of anesthesia including but not limited to:  - adverse reactions to medications - damage to eyes, teeth, lips or other oral mucosa - nerve damage due to positioning  - risk of bleeding, infection and or nerve damage from spinal that could lead to paralysis - risk of headache or failed spinal - damage to teeth, lips or other oral mucosa - sore throat or hoarseness - damage to heart, brain, nerves, lungs, other parts of body or loss of life  Patient voiced understanding.)        Anesthesia Quick Evaluation

## 2023-12-20 NOTE — Interval H&P Note (Signed)
 History and Physical Interval Note:  12/20/2023 6:17 AM  Mary Wood  has presented today for surgery, with the diagnosis of PRIMARY OSTEOARTHRITIS OF LEFT KNEE..  The various methods of treatment have been discussed with the patient and family. After consideration of risks, benefits and other options for treatment, the patient has consented to  Procedure(s): COMPUTER ASSISTED TOTAL KNEE ARTHROPLASTY (Left) as a surgical intervention.  The patient's history has been reviewed, patient examined, no change in status, stable for surgery.  I have reviewed the patient's chart and labs.  Questions were answered to the patient's satisfaction.     Jossilyn Benda P My Madariaga

## 2023-12-21 ENCOUNTER — Encounter: Payer: Self-pay | Admitting: Orthopedic Surgery

## 2023-12-21 DIAGNOSIS — I10 Essential (primary) hypertension: Secondary | ICD-10-CM | POA: Diagnosis not present

## 2023-12-21 DIAGNOSIS — M1712 Unilateral primary osteoarthritis, left knee: Secondary | ICD-10-CM | POA: Diagnosis not present

## 2023-12-21 DIAGNOSIS — Z79899 Other long term (current) drug therapy: Secondary | ICD-10-CM | POA: Diagnosis not present

## 2023-12-21 DIAGNOSIS — Z96651 Presence of right artificial knee joint: Secondary | ICD-10-CM | POA: Diagnosis not present

## 2023-12-21 MED ORDER — OXYCODONE HCL 5 MG PO TABS
ORAL_TABLET | ORAL | Status: AC
  Filled 2023-12-21: qty 1

## 2023-12-21 MED ORDER — ACETAMINOPHEN 10 MG/ML IV SOLN
INTRAVENOUS | Status: AC
Start: 2023-12-21 — End: ?
  Filled 2023-12-21: qty 100

## 2023-12-21 MED ORDER — AMLODIPINE BESYLATE 5 MG PO TABS
ORAL_TABLET | ORAL | Status: AC
  Filled 2023-12-21: qty 1

## 2023-12-21 MED ORDER — LOSARTAN POTASSIUM 50 MG PO TABS
ORAL_TABLET | ORAL | Status: AC
  Filled 2023-12-21: qty 2

## 2023-12-21 MED ORDER — TRAMADOL HCL 50 MG PO TABS
ORAL_TABLET | ORAL | Status: AC
  Filled 2023-12-21: qty 1

## 2023-12-21 MED ORDER — ROSUVASTATIN CALCIUM 20 MG PO TABS
ORAL_TABLET | ORAL | Status: AC
  Filled 2023-12-21: qty 1

## 2023-12-21 MED ORDER — PANTOPRAZOLE SODIUM 40 MG IV SOLR
INTRAVENOUS | Status: AC
  Filled 2023-12-21: qty 10

## 2023-12-21 MED ORDER — SENNOSIDES-DOCUSATE SODIUM 8.6-50 MG PO TABS
ORAL_TABLET | ORAL | Status: AC
  Filled 2023-12-21: qty 1

## 2023-12-21 MED ORDER — OXYCODONE HCL 5 MG PO TABS
ORAL_TABLET | ORAL | Status: AC
  Filled 2023-12-21: qty 2

## 2023-12-21 MED ORDER — METOCLOPRAMIDE HCL 10 MG PO TABS
ORAL_TABLET | ORAL | Status: AC
Start: 2023-12-21 — End: ?
  Filled 2023-12-21: qty 1

## 2023-12-21 MED ORDER — TRAMADOL HCL 50 MG PO TABS: 50.0000 mg | ORAL_TABLET | ORAL | 0 refills | Status: DC | PRN

## 2023-12-21 MED ORDER — CELECOXIB 200 MG PO CAPS
ORAL_CAPSULE | ORAL | Status: AC
  Filled 2023-12-21: qty 1

## 2023-12-21 MED ORDER — OXYCODONE HCL 5 MG PO TABS: 5.0000 mg | ORAL_TABLET | ORAL | 0 refills | Status: DC | PRN

## 2023-12-21 MED ORDER — CELECOXIB 200 MG PO CAPS: 200.0000 mg | ORAL_CAPSULE | Freq: Two times a day (BID) | ORAL | 1 refills | Status: DC

## 2023-12-21 MED ORDER — FERROUS SULFATE 325 (65 FE) MG PO TABS
ORAL_TABLET | ORAL | Status: AC
Start: 2023-12-21 — End: ?
  Filled 2023-12-21: qty 1

## 2023-12-21 MED ORDER — ASPIRIN 81 MG PO CHEW: 81.0000 mg | CHEWABLE_TABLET | Freq: Two times a day (BID) | ORAL | Status: AC

## 2023-12-21 MED ORDER — ASPIRIN 81 MG PO CHEW
CHEWABLE_TABLET | ORAL | Status: AC
  Filled 2023-12-21: qty 1

## 2023-12-21 MED ORDER — MAGNESIUM HYDROXIDE 400 MG/5ML PO SUSP
ORAL | Status: AC
  Filled 2023-12-21: qty 30

## 2023-12-21 MED ORDER — ACETAMINOPHEN 10 MG/ML IV SOLN
INTRAVENOUS | Status: AC
  Filled 2023-12-21: qty 100

## 2023-12-21 NOTE — Plan of Care (Signed)
  Problem: Clinical Measurements: Goal: Respiratory complications will improve Outcome: Progressing   Problem: Activity: Goal: Risk for activity intolerance will decrease Outcome: Progressing   Problem: Coping: Goal: Level of anxiety will decrease Outcome: Progressing   Problem: Elimination: Goal: Will not experience complications related to urinary retention Outcome: Progressing   Problem: Activity: Goal: Ability to avoid complications of mobility impairment will improve Outcome: Progressing   Problem: Pain Management: Goal: Pain level will decrease with appropriate interventions Outcome: Progressing

## 2023-12-21 NOTE — Progress Notes (Signed)
 Physical Therapy Treatment Patient Details Name: Mary Wood MRN: [id number] DOB: 1956-11-11 Today's Date: 12/21/2023   History of Present Illness Mary Wood is a 67yoF who comes to [facility] on 12/20/23 for elective Left TKA c Dr. Mardee. Pt reports Rt TKA in 2023 with favorable outcome. PMH: HTN, shingles, Lt elbow surgery (2012),    PT Comments  Completes gait,stairs and HEP/ex demo and review.  No further acute PT needs.  Goals met.   If plan is discharge home, recommend the following: A little help with walking and/or transfers;Assist for transportation;Assistance with cooking/housework;Help with stairs or ramp for entrance   Can travel by private vehicle        Equipment Recommendations  None recommended by PT (Pt has all recommended DME already)    Recommendations for Other Services       Precautions / Restrictions Precautions Precautions: Fall Restrictions Weight Bearing Restrictions Per Provider Order: Yes LLE Weight Bearing Per Provider Order: Weight bearing as tolerated     Mobility  Bed Mobility               General bed mobility comments: in recliner before and after.  does well per OT notes    Transfers Overall transfer level: Needs assistance Equipment used: Rolling walker (2 wheels) Transfers: Sit to/from Stand Sit to Stand: Supervision           General transfer comment: no cues needed    Ambulation/Gait Ambulation/Gait assistance: Independent, Supervision Gait Distance (Feet): 200 Feet Assistive device: Rolling walker (2 wheels) Gait Pattern/deviations: Step-through pattern, Step-to pattern, WFL(Within Functional Limits)           Stairs Stairs: Yes Stairs assistance: Contact guard assist Stair Management: One rail Left, Step to pattern, Forwards Number of Stairs: 12 General stair comments: completes flight to access bedroom upstairs   Wheelchair Mobility     Tilt Bed    Modified Rankin (Stroke Patients Only)        Balance Overall balance assessment: Modified Independent                                          Cognition Arousal: Alert Behavior During Therapy: WFL for tasks assessed/performed Overall Cognitive Status: Within Functional Limits for tasks assessed                                          Exercises Total Joint Exercises Goniometric ROM: 5-90 limited by chair Other Exercises Other Exercises: HEP review and demo    General Comments        Pertinent Vitals/Pain Pain Assessment Pain Assessment: 0-10 Pain Score: 4  Pain Location: L knee Pain Descriptors / Indicators: Aching, Sore, Guarding Pain Intervention(s): Limited activity within patient's tolerance, Monitored during session, Repositioned, Ice applied    Home Living Family/patient expects to be discharged to:: Private residence Living Arrangements: Spouse/significant other;Children Available Help at Discharge: Family Type of Home: House Home Access: Stairs to enter Entrance Stairs-Rails: Right Entrance Stairs-Number of Steps: 4 from garage Alternate Level Stairs-Number of Steps: 14 to get to 2nd floor bed room, full bath on main level; Home Layout: Two level Home Equipment: [employer] (2 wheels);Crutches;BSC/3in1      Prior Function            PT Goals (current  goals can now be found in the care plan section) Progress towards PT goals: Progressing toward goals    Frequency    BID      PT Plan      Co-evaluation              AM-PAC PT 6 Clicks Mobility   Outcome Measure  Help needed turning from your back to your side while in a flat bed without using bedrails?: None Help needed moving from lying on your back to sitting on the side of a flat bed without using bedrails?: None Help needed moving to and from a bed to a chair (including a wheelchair)?: None Help needed standing up from a chair using your arms (e.g., wheelchair or bedside chair)?:  None Help needed to walk in hospital room?: A Little Help needed climbing 3-5 steps with a railing? : A Little 6 Click Score: 22    End of Session Equipment Utilized During Treatment: Gait belt Activity Tolerance: Patient tolerated treatment well;Patient limited by pain Patient left: in chair;with call bell/phone within reach;with family/visitor present Nurse Communication: Mobility status;Patient requests pain meds PT Visit Diagnosis: Difficulty in walking, not elsewhere classified (R26.2);Muscle weakness (generalized) (M62.81);Other symptoms and signs involving the nervous system (R29.898)     Time: 9096-9081 PT Time Calculation (min) (ACUTE ONLY): 15 min  Charges:    $Gait Training: 8-22 mins PT General Charges $$ ACUTE PT VISIT: 1 Visit                   Lauraine Gills, PTA 12/21/23, 9:38 AM

## 2023-12-21 NOTE — Progress Notes (Signed)
 Pt discharged home, discharge instruction given to patient/spouse. Pt verbalizes understanding. IV removed, dressing clean, dry, and intact left knee. Bilateral ted hose in place. Bone foam, incentive spirometer, 2 honeycombs dressing, polar care equipment sent home with patient. Spouse transporting patient home via car.

## 2023-12-21 NOTE — Evaluation (Signed)
 Occupational Therapy Evaluation Patient Details Name: Mary Wood MRN: [id number] DOB: 1956/10/06 Today's Date: 12/21/2023   History of Present Illness Mary Wood is a 67yoF who comes to [facility] on 12/20/23 for elective Left TKA c Dr. Mardee. Pt reports Rt TKA in 2023 with favorable outcome. PMH: HTN, shingles, Lt elbow surgery (2012),   Clinical Impression   Mary Wood seen for OT evaluation this date, POD#1 from above surgery. Pt was independent in all ADLs prior to surgery. Pt is eager to return to PLOF with less pain and improved safety and independence. Pt currently requires minimal assist for LB dressing while in seated position due to pain and limited AROM of L knee. Pt instructed in polar care mgt, falls prevention strategies, home/routines modifications, DME/AE for LB bathing and dressing tasks, and compression stocking mgt. Pt with excellent recall of education from prior R TKA in 2023. Return demos understanding of all education provided. No further OT needs identified. Will sign off at this time. Do not currently anticipate any OT needs following this hospitalization.           If plan is discharge home, recommend the following: A little help with bathing/dressing/bathroom    Functional Status Assessment  Patient has not had a recent decline in their functional status  Equipment Recommendations       Recommendations for Other Services       Precautions / Restrictions Precautions Precautions: Fall Restrictions Weight Bearing Restrictions Per Provider Order: Yes LLE Weight Bearing Per Provider Order: Weight bearing as tolerated      Mobility Bed Mobility Overal bed mobility: Modified Independent             General bed mobility comments: HOB elevated, no physical assist required. Comes to sitting at EOB with good safety awareness.    Transfers Overall transfer level: Needs assistance Equipment used: Rolling walker (2 wheels) Transfers: Sit to/from Stand Sit to  Stand: Supervision                  Balance Overall balance assessment: Modified Independent, No apparent balance deficits (not formally assessed)                                         ADL either performed or assessed with clinical judgement   ADL Overall ADL's : Needs assistance/impaired                                     Functional mobility during ADLs: Supervision/safety;Rolling walker (2 wheels);Cueing for safety General ADL Comments: SUPERVISION for safety with RW for toilet transfer, toileting, standing HH at sink, and functional transfers. Pt reports spouse assisted with LB dressing overnight, and that she was able to perform at or near baseline level of functional independence. All questions regarding ADL management upon hospital DC answered. Pt confident with good recall of safety and AE education from previous TKR in 2023     Vision Patient Visual Report: No change from baseline       Perception         Praxis         Pertinent Vitals/Pain Pain Assessment Pain Assessment: 0-10 Pain Score: 5  Pain Location: L knee Pain Descriptors / Indicators: Aching, Sore, Guarding Pain Intervention(s): Limited activity within patient's tolerance, Monitored during session, Repositioned,  RN gave pain meds during session     Extremity/Trunk Assessment Upper Extremity Assessment Upper Extremity Assessment: Overall WFL for tasks assessed   Lower Extremity Assessment Lower Extremity Assessment: LLE deficits/detail LLE Deficits / Details: s/p L TKA WBAT   Cervical / Trunk Assessment Cervical / Trunk Assessment: Normal   Communication Communication Communication: No apparent difficulties Cueing Techniques: Verbal cues   Cognition Arousal: Alert Behavior During Therapy: WFL for tasks assessed/performed Overall Cognitive Status: Within Functional Limits for tasks assessed                                       General  Comments       Exercises Other Exercises Other Exercises: Pt educated in falls prevention strategies, safe use of AE/DME for LB ADL management, compression stocking management, polar care management, complementary alternative methods for pain management including gentle self-massage and distraction techniques, and routines modifications to support safety and fxl independence during meaningful occupations of daily life. She return verbalizes/demos understanding of education provided. Good recall appreciated from previous knee sx.   Shoulder Instructions      Home Living Family/patient expects to be discharged to:: Private residence Living Arrangements: Spouse/significant other;Children Available Help at Discharge: Family Type of Home: House Home Access: Stairs to enter [employer] of Steps: 4 from garage Entrance Stairs-Rails: Right Home Layout: Two level Alternate Level Stairs-Number of Steps: 14 to get to 2nd floor bed room, full bath on main level; Alternate Level Stairs-Rails: Left           Home Equipment: Rolling Walker (2 wheels);Crutches;BSC/3in1          Prior Functioning/Environment Prior Level of Function : Independent/Modified Independent                        OT Problem List: Decreased strength;Decreased range of motion;Pain;Decreased coordination      OT Treatment/Interventions:      OT Goals(Current goals can be found in the care plan section) Acute Rehab OT Goals Patient Stated Goal: To go home OT Goal Formulation: All assessment and education complete, DC therapy Time For Goal Achievement: 12/21/23 Potential to Achieve Goals: Good  OT Frequency:      Co-evaluation              AM-PAC OT 6 Clicks Daily Activity     Outcome Measure Help from another person eating meals?: None Help from another person taking care of personal grooming?: None Help from another person toileting, which includes using toliet, bedpan, or urinal?:  A Little Help from another person bathing (including washing, rinsing, drying)?: A Little Help from another person to put on and taking off regular upper body clothing?: None Help from another person to put on and taking off regular lower body clothing?: None 6 Click Score: 22   End of Session Equipment Utilized During Treatment: Rolling walker (2 wheels) Nurse Communication: Mobility status  Activity Tolerance: Patient tolerated treatment well Patient left: in chair;with call bell/phone within reach  OT Visit Diagnosis: Other abnormalities of gait and mobility (R26.89);Pain Pain - Right/Left: Left Pain - part of body: Knee;Leg                Time: 9169-9154 OT Time Calculation (min): 15 min Charges:  OT General Charges $OT Visit: 1 Visit OT Evaluation $OT Eval Low Complexity: 1 Low  Jhonny Pelton, M.S., OTR/L 12/21/23,  9:34 AM

## 2023-12-21 NOTE — Care Management Obs Status (Signed)
 MEDICARE OBSERVATION STATUS NOTIFICATION   Patient Details  Name: ZILAH VILLAFLOR MRN: [id number] Date of Birth: [birth year]   Medicare Observation Status Notification Given:  Yes Patient signed paper Copy and it was placed on the chart    Royanne JINNY Bernheim, RN 12/21/2023, 10:12 AM

## 2023-12-21 NOTE — Anesthesia Postprocedure Evaluation (Signed)
 Anesthesia Post Note  Patient: Mary Wood  Procedure(s) Performed: COMPUTER ASSISTED TOTAL KNEE ARTHROPLASTY (Left: Knee)  Patient location during evaluation: Nursing Unit Anesthesia Type: Spinal Level of consciousness: oriented and awake and alert Pain management: pain level controlled Vital Signs Assessment: post-procedure vital signs reviewed and stable Respiratory status: spontaneous breathing and respiratory function stable Cardiovascular status: blood pressure returned to baseline and stable Postop Assessment: no headache, no backache, no apparent nausea or vomiting and patient able to bend at knees Anesthetic complications: no   No notable events documented.   Last Vitals:  Vitals:   12/21/23 0002 12/21/23 0448  BP: 130/64 134/67  Pulse: 83 77  Resp: 16 16  Temp: 36.7 C 36.5 C  SpO2: 95% 96%    Last Pain:  Vitals:   12/21/23 0530  TempSrc:   PainSc: 5                  Donyel Castagnola [place]

## 2023-12-23 DIAGNOSIS — Z471 Aftercare following joint replacement surgery: Secondary | ICD-10-CM | POA: Diagnosis not present

## 2023-12-23 DIAGNOSIS — M810 Age-related osteoporosis without current pathological fracture: Secondary | ICD-10-CM | POA: Diagnosis not present

## 2023-12-23 DIAGNOSIS — Z791 Long term (current) use of non-steroidal anti-inflammatories (NSAID): Secondary | ICD-10-CM | POA: Diagnosis not present

## 2023-12-23 DIAGNOSIS — I10 Essential (primary) hypertension: Secondary | ICD-10-CM | POA: Diagnosis not present

## 2023-12-23 DIAGNOSIS — Z87891 Personal history of nicotine dependence: Secondary | ICD-10-CM | POA: Diagnosis not present

## 2023-12-23 DIAGNOSIS — Z96652 Presence of left artificial knee joint: Secondary | ICD-10-CM | POA: Diagnosis not present

## 2023-12-23 DIAGNOSIS — Z96651 Presence of right artificial knee joint: Secondary | ICD-10-CM | POA: Diagnosis not present

## 2023-12-23 DIAGNOSIS — M51369 Other intervertebral disc degeneration, lumbar region without mention of lumbar back pain or lower extremity pain: Secondary | ICD-10-CM | POA: Diagnosis not present

## 2023-12-23 DIAGNOSIS — Z7982 Long term (current) use of aspirin: Secondary | ICD-10-CM | POA: Diagnosis not present

## 2023-12-26 DIAGNOSIS — M51369 Other intervertebral disc degeneration, lumbar region without mention of lumbar back pain or lower extremity pain: Secondary | ICD-10-CM | POA: Diagnosis not present

## 2023-12-26 DIAGNOSIS — Z471 Aftercare following joint replacement surgery: Secondary | ICD-10-CM | POA: Diagnosis not present

## 2023-12-26 DIAGNOSIS — Z87891 Personal history of nicotine dependence: Secondary | ICD-10-CM | POA: Diagnosis not present

## 2023-12-26 DIAGNOSIS — Z96652 Presence of left artificial knee joint: Secondary | ICD-10-CM | POA: Diagnosis not present

## 2023-12-26 DIAGNOSIS — Z791 Long term (current) use of non-steroidal anti-inflammatories (NSAID): Secondary | ICD-10-CM | POA: Diagnosis not present

## 2023-12-26 DIAGNOSIS — I10 Essential (primary) hypertension: Secondary | ICD-10-CM | POA: Diagnosis not present

## 2023-12-26 DIAGNOSIS — Z7982 Long term (current) use of aspirin: Secondary | ICD-10-CM | POA: Diagnosis not present

## 2023-12-26 DIAGNOSIS — M810 Age-related osteoporosis without current pathological fracture: Secondary | ICD-10-CM | POA: Diagnosis not present

## 2023-12-26 DIAGNOSIS — Z96651 Presence of right artificial knee joint: Secondary | ICD-10-CM | POA: Diagnosis not present

## 2023-12-27 DIAGNOSIS — Z7982 Long term (current) use of aspirin: Secondary | ICD-10-CM | POA: Diagnosis not present

## 2023-12-27 DIAGNOSIS — Z87891 Personal history of nicotine dependence: Secondary | ICD-10-CM | POA: Diagnosis not present

## 2023-12-27 DIAGNOSIS — Z96651 Presence of right artificial knee joint: Secondary | ICD-10-CM | POA: Diagnosis not present

## 2023-12-27 DIAGNOSIS — Z96652 Presence of left artificial knee joint: Secondary | ICD-10-CM | POA: Diagnosis not present

## 2023-12-27 DIAGNOSIS — Z791 Long term (current) use of non-steroidal anti-inflammatories (NSAID): Secondary | ICD-10-CM | POA: Diagnosis not present

## 2023-12-27 DIAGNOSIS — I10 Essential (primary) hypertension: Secondary | ICD-10-CM | POA: Diagnosis not present

## 2023-12-27 DIAGNOSIS — M51369 Other intervertebral disc degeneration, lumbar region without mention of lumbar back pain or lower extremity pain: Secondary | ICD-10-CM | POA: Diagnosis not present

## 2023-12-27 DIAGNOSIS — M810 Age-related osteoporosis without current pathological fracture: Secondary | ICD-10-CM | POA: Diagnosis not present

## 2023-12-27 DIAGNOSIS — Z471 Aftercare following joint replacement surgery: Secondary | ICD-10-CM | POA: Diagnosis not present

## 2023-12-28 DIAGNOSIS — Z791 Long term (current) use of non-steroidal anti-inflammatories (NSAID): Secondary | ICD-10-CM | POA: Diagnosis not present

## 2023-12-28 DIAGNOSIS — Z96651 Presence of right artificial knee joint: Secondary | ICD-10-CM | POA: Diagnosis not present

## 2023-12-28 DIAGNOSIS — Z96652 Presence of left artificial knee joint: Secondary | ICD-10-CM | POA: Diagnosis not present

## 2023-12-28 DIAGNOSIS — Z87891 Personal history of nicotine dependence: Secondary | ICD-10-CM | POA: Diagnosis not present

## 2023-12-28 DIAGNOSIS — Z7982 Long term (current) use of aspirin: Secondary | ICD-10-CM | POA: Diagnosis not present

## 2023-12-28 DIAGNOSIS — M810 Age-related osteoporosis without current pathological fracture: Secondary | ICD-10-CM | POA: Diagnosis not present

## 2023-12-28 DIAGNOSIS — I10 Essential (primary) hypertension: Secondary | ICD-10-CM | POA: Diagnosis not present

## 2023-12-28 DIAGNOSIS — Z471 Aftercare following joint replacement surgery: Secondary | ICD-10-CM | POA: Diagnosis not present

## 2023-12-28 DIAGNOSIS — M51369 Other intervertebral disc degeneration, lumbar region without mention of lumbar back pain or lower extremity pain: Secondary | ICD-10-CM | POA: Diagnosis not present

## 2023-12-30 DIAGNOSIS — Z96651 Presence of right artificial knee joint: Secondary | ICD-10-CM | POA: Diagnosis not present

## 2023-12-30 DIAGNOSIS — Z471 Aftercare following joint replacement surgery: Secondary | ICD-10-CM | POA: Diagnosis not present

## 2023-12-30 DIAGNOSIS — M51369 Other intervertebral disc degeneration, lumbar region without mention of lumbar back pain or lower extremity pain: Secondary | ICD-10-CM | POA: Diagnosis not present

## 2023-12-30 DIAGNOSIS — Z791 Long term (current) use of non-steroidal anti-inflammatories (NSAID): Secondary | ICD-10-CM | POA: Diagnosis not present

## 2023-12-30 DIAGNOSIS — Z87891 Personal history of nicotine dependence: Secondary | ICD-10-CM | POA: Diagnosis not present

## 2023-12-30 DIAGNOSIS — Z96652 Presence of left artificial knee joint: Secondary | ICD-10-CM | POA: Diagnosis not present

## 2023-12-30 DIAGNOSIS — Z7982 Long term (current) use of aspirin: Secondary | ICD-10-CM | POA: Diagnosis not present

## 2023-12-30 DIAGNOSIS — M810 Age-related osteoporosis without current pathological fracture: Secondary | ICD-10-CM | POA: Diagnosis not present

## 2023-12-30 DIAGNOSIS — I10 Essential (primary) hypertension: Secondary | ICD-10-CM | POA: Diagnosis not present

## 2024-01-02 ENCOUNTER — Encounter: Payer: Self-pay | Admitting: Nurse Practitioner

## 2024-01-02 DIAGNOSIS — Z96652 Presence of left artificial knee joint: Secondary | ICD-10-CM | POA: Diagnosis not present

## 2024-01-02 DIAGNOSIS — I10 Essential (primary) hypertension: Secondary | ICD-10-CM | POA: Diagnosis not present

## 2024-01-02 DIAGNOSIS — Z471 Aftercare following joint replacement surgery: Secondary | ICD-10-CM | POA: Diagnosis not present

## 2024-01-02 DIAGNOSIS — Z96651 Presence of right artificial knee joint: Secondary | ICD-10-CM | POA: Diagnosis not present

## 2024-01-02 DIAGNOSIS — M51369 Other intervertebral disc degeneration, lumbar region without mention of lumbar back pain or lower extremity pain: Secondary | ICD-10-CM | POA: Diagnosis not present

## 2024-01-02 DIAGNOSIS — Z7982 Long term (current) use of aspirin: Secondary | ICD-10-CM | POA: Diagnosis not present

## 2024-01-02 DIAGNOSIS — Z791 Long term (current) use of non-steroidal anti-inflammatories (NSAID): Secondary | ICD-10-CM | POA: Diagnosis not present

## 2024-01-02 DIAGNOSIS — M810 Age-related osteoporosis without current pathological fracture: Secondary | ICD-10-CM | POA: Diagnosis not present

## 2024-01-02 DIAGNOSIS — Z87891 Personal history of nicotine dependence: Secondary | ICD-10-CM | POA: Diagnosis not present

## 2024-01-04 DIAGNOSIS — G8929 Other chronic pain: Secondary | ICD-10-CM | POA: Diagnosis not present

## 2024-01-04 DIAGNOSIS — M25662 Stiffness of left knee, not elsewhere classified: Secondary | ICD-10-CM | POA: Diagnosis not present

## 2024-01-04 DIAGNOSIS — Z96652 Presence of left artificial knee joint: Secondary | ICD-10-CM | POA: Diagnosis not present

## 2024-01-04 DIAGNOSIS — M25562 Pain in left knee: Secondary | ICD-10-CM | POA: Diagnosis not present

## 2024-01-04 DIAGNOSIS — M6281 Muscle weakness (generalized): Secondary | ICD-10-CM | POA: Diagnosis not present

## 2024-01-07 DIAGNOSIS — Z96652 Presence of left artificial knee joint: Secondary | ICD-10-CM | POA: Diagnosis not present

## 2024-01-08 DIAGNOSIS — Z471 Aftercare following joint replacement surgery: Secondary | ICD-10-CM | POA: Diagnosis not present

## 2024-01-11 DIAGNOSIS — M25562 Pain in left knee: Secondary | ICD-10-CM | POA: Diagnosis not present

## 2024-01-11 DIAGNOSIS — Z96652 Presence of left artificial knee joint: Secondary | ICD-10-CM | POA: Diagnosis not present

## 2024-01-13 DIAGNOSIS — M6281 Muscle weakness (generalized): Secondary | ICD-10-CM | POA: Diagnosis not present

## 2024-01-13 DIAGNOSIS — Z96652 Presence of left artificial knee joint: Secondary | ICD-10-CM | POA: Diagnosis not present

## 2024-01-13 DIAGNOSIS — M25562 Pain in left knee: Secondary | ICD-10-CM | POA: Diagnosis not present

## 2024-01-13 DIAGNOSIS — G8929 Other chronic pain: Secondary | ICD-10-CM | POA: Diagnosis not present

## 2024-01-13 DIAGNOSIS — M25662 Stiffness of left knee, not elsewhere classified: Secondary | ICD-10-CM | POA: Diagnosis not present

## 2024-01-18 DIAGNOSIS — M6281 Muscle weakness (generalized): Secondary | ICD-10-CM | POA: Diagnosis not present

## 2024-01-18 DIAGNOSIS — G8929 Other chronic pain: Secondary | ICD-10-CM | POA: Diagnosis not present

## 2024-01-18 DIAGNOSIS — Z96652 Presence of left artificial knee joint: Secondary | ICD-10-CM | POA: Diagnosis not present

## 2024-01-18 DIAGNOSIS — M25562 Pain in left knee: Secondary | ICD-10-CM | POA: Diagnosis not present

## 2024-01-18 DIAGNOSIS — M25662 Stiffness of left knee, not elsewhere classified: Secondary | ICD-10-CM | POA: Diagnosis not present

## 2024-01-21 DIAGNOSIS — M25562 Pain in left knee: Secondary | ICD-10-CM | POA: Diagnosis not present

## 2024-01-21 DIAGNOSIS — M6281 Muscle weakness (generalized): Secondary | ICD-10-CM | POA: Diagnosis not present

## 2024-01-21 DIAGNOSIS — Z96652 Presence of left artificial knee joint: Secondary | ICD-10-CM | POA: Diagnosis not present

## 2024-01-21 DIAGNOSIS — M25662 Stiffness of left knee, not elsewhere classified: Secondary | ICD-10-CM | POA: Diagnosis not present

## 2024-01-21 DIAGNOSIS — G8929 Other chronic pain: Secondary | ICD-10-CM | POA: Diagnosis not present

## 2024-01-28 DIAGNOSIS — Z96652 Presence of left artificial knee joint: Secondary | ICD-10-CM | POA: Diagnosis not present

## 2024-01-29 NOTE — Patient Instructions (Signed)
 Be Involved in Caring For Your Health:  Taking Medications When medications are taken as directed, they can greatly improve your health. But if they are not taken as prescribed, they may not work. In some cases, not taking them correctly can be harmful. To help ensure your treatment remains effective and safe, understand your medications and how to take them. Bring your medications to each visit for review by your provider.  Your lab results, notes, and after visit summary will be available on My Chart. We strongly encourage you to use this feature. If lab results are abnormal the clinic will contact you with the appropriate steps. If the clinic does not contact you assume the results are satisfactory. You can always view your results on My Chart. If you have questions regarding your health or results, please contact the clinic during office hours. You can also ask questions on My Chart.  We at [facility] are grateful that you chose [place] to provide your care. We strive to provide evidence-based and compassionate care and are always looking for feedback. If you get a survey from the clinic please complete this so we can hear your opinions.  Heart-Healthy Eating Plan Many factors influence your heart health, including eating and exercise habits. Heart health is also called coronary health. Coronary risk increases with abnormal blood fat (lipid) levels. A heart-healthy eating plan includes limiting unhealthy fats, increasing healthy fats, limiting salt (sodium) intake, and making other diet and lifestyle changes. What is my plan? Your health care provider may recommend that: You limit your fat intake to _________% or less of your total calories each day. You limit your saturated fat intake to _________% or less of your total calories each day. You limit the amount of cholesterol in your diet to less than _________ mg per day. You limit the amount of sodium in your diet to less than _________  mg per day. What are tips for following this plan? Cooking Cook foods using methods other than frying. Baking, boiling, grilling, and broiling are all good options. Other ways to reduce fat include: Removing the skin from poultry. Removing all visible fats from meats. Steaming vegetables in water or broth. Meal planning  At meals, imagine dividing your plate into fourths: Fill one-half of your plate with vegetables and green salads. Fill one-fourth of your plate with whole grains. Fill one-fourth of your plate with lean protein foods. Eat 2-4 cups of vegetables per day. One cup of vegetables equals 1 cup (91 g) broccoli or cauliflower florets, 2 medium carrots, 1 large bell pepper, 1 large sweet potato, 1 large tomato, 1 medium white potato, 2 cups (150 g) raw leafy greens. Eat 1-2 cups of fruit per day. One cup of fruit equals 1 small apple, 1 large banana, 1 cup (237 g) mixed fruit, 1 large orange,  cup (82 g) dried fruit, 1 cup (240 mL) 100% fruit juice. Eat more foods that contain soluble fiber. Examples include apples, broccoli, carrots, beans, peas, and barley. Aim to get 25-30 g of fiber per day. Increase your consumption of legumes, nuts, and seeds to 4-5 servings per week. One serving of dried beans or legumes equals  cup (90 g) cooked, 1 serving of nuts is  oz (12 almonds, 24 pistachios, or 7 walnut halves), and 1 serving of seeds equals  oz (8 g). Fats Choose healthy fats more often. Choose monounsaturated and polyunsaturated fats, such as olive and canola oils, avocado oil, flaxseeds, walnuts, almonds, and seeds. Eat  more omega-3 fats. Choose salmon, mackerel, sardines, tuna, flaxseed oil, and ground flaxseeds. Aim to eat fish at least 2 times each week. Check food labels carefully to identify foods with trans fats or high amounts of saturated fat. Limit saturated fats. These are found in animal products, such as meats, butter, and cream. Plant sources of saturated fats  include palm oil, palm kernel oil, and coconut oil. Avoid foods with partially hydrogenated oils in them. These contain trans fats. Examples are stick margarine, some tub margarines, cookies, crackers, and other baked goods. Avoid fried foods. General information Eat more home-cooked food and less restaurant, buffet, and fast food. Limit or avoid alcohol. Limit foods that are high in added sugar and simple starches such as foods made using white refined flour (white breads, pastries, sweets). Lose weight if you are overweight. Losing just 5-10% of your body weight can help your overall health and prevent diseases such as diabetes and heart disease. Monitor your sodium intake, especially if you have high blood pressure. Talk with your health care provider about your sodium intake. Try to incorporate more vegetarian meals weekly. What foods should I eat? Fruits All fresh, canned (in natural juice), or frozen fruits. Vegetables Fresh or frozen vegetables (raw, steamed, roasted, or grilled). Green salads. Grains Most grains. Choose whole wheat and whole grains most of the time. Rice and pasta, including brown rice and pastas made with whole wheat. Meats and other proteins Lean, well-trimmed beef, veal, pork, and lamb. Chicken and [place] without skin. All fish and shellfish. Wild duck, rabbit, pheasant, and venison. Egg whites or low-cholesterol egg substitutes. Dried beans, peas, lentils, and tofu. Seeds and most nuts. Dairy Low-fat or nonfat cheeses, including ricotta and mozzarella. Skim or 1% milk (liquid, powdered, or evaporated). Buttermilk made with low-fat milk. Nonfat or low-fat yogurt. Fats and oils Non-hydrogenated (trans-free) margarines. Vegetable oils, including soybean, sesame, sunflower, olive, avocado, peanut, safflower, corn, canola, and cottonseed. Salad dressings or mayonnaise made with a vegetable oil. Beverages Water (mineral or sparkling). Coffee and tea. Unsweetened ice  tea. Diet beverages. Sweets and desserts Sherbet, gelatin, and fruit ice. Small amounts of dark chocolate. Limit all sweets and desserts. Seasonings and condiments All seasonings and condiments. The items listed above may not be a complete list of foods and beverages you can eat. Contact a dietitian for more options. What foods should I avoid? Fruits Canned fruit in heavy syrup. Fruit in cream or butter sauce. Fried fruit. Limit coconut. Vegetables Vegetables cooked in cheese, cream, or butter sauce. Fried vegetables. Grains Breads made with saturated or trans fats, oils, or whole milk. Croissants. Sweet rolls. Donuts. High-fat crackers, such as cheese crackers and chips. Meats and other proteins Fatty meats, such as hot dogs, ribs, sausage, bacon, rib-eye roast or steak. High-fat deli meats, such as salami and bologna. Caviar. Domestic duck and goose. Organ meats, such as liver. Dairy Cream, sour cream, cream cheese, and creamed cottage cheese. Whole-milk cheeses. Whole or 2% milk (liquid, evaporated, or condensed). Whole buttermilk. Cream sauce or high-fat cheese sauce. Whole-milk yogurt. Fats and oils Meat fat, or shortening. Cocoa butter, hydrogenated oils, palm oil, coconut oil, palm kernel oil. Solid fats and shortenings, including bacon fat, salt pork, lard, and butter. Nondairy cream substitutes. Salad dressings with cheese or sour cream. Beverages Regular sodas and any drinks with added sugar. Sweets and desserts Frosting. Pudding. Cookies. Cakes. Pies. Milk chocolate or white chocolate. Buttered syrups. Full-fat ice cream or ice cream drinks. The items listed above may  not be a complete list of foods and beverages to avoid. Contact a dietitian for more information. Summary Heart-healthy meal planning includes limiting unhealthy fats, increasing healthy fats, limiting salt (sodium) intake and making other diet and lifestyle changes. Lose weight if you are overweight. Losing just  5-10% of your body weight can help your overall health and prevent diseases such as diabetes and heart disease. Focus on eating a balance of foods, including fruits and vegetables, low-fat or nonfat dairy, lean protein, nuts and legumes, whole grains, and heart-healthy oils and fats. This information is not intended to replace advice given to you by your health care provider. Make sure you discuss any questions you have with your health care provider. Document Revised: 01/05/2022 Document Reviewed: 01/05/2022 Elsevier Patient Education  2024 [employer].

## 2024-01-31 ENCOUNTER — Ambulatory Visit (INDEPENDENT_AMBULATORY_CARE_PROVIDER_SITE_OTHER): Payer: Medicare PPO | Admitting: Nurse Practitioner

## 2024-01-31 ENCOUNTER — Encounter: Payer: Self-pay | Admitting: Nurse Practitioner

## 2024-01-31 VITALS — BP 126/80 | HR 72 | Temp 97.7°F | Ht 67.3 in | Wt 218.4 lb

## 2024-01-31 DIAGNOSIS — M8588 Other specified disorders of bone density and structure, other site: Secondary | ICD-10-CM | POA: Diagnosis not present

## 2024-01-31 DIAGNOSIS — Z6831 Body mass index (BMI) 31.0-31.9, adult: Secondary | ICD-10-CM

## 2024-01-31 DIAGNOSIS — E6609 Other obesity due to excess calories: Secondary | ICD-10-CM

## 2024-01-31 DIAGNOSIS — I10 Essential (primary) hypertension: Secondary | ICD-10-CM | POA: Diagnosis not present

## 2024-01-31 DIAGNOSIS — Z800 Family history of malignant neoplasm of digestive organs: Secondary | ICD-10-CM

## 2024-01-31 DIAGNOSIS — Z0000 Encounter for general adult medical examination without abnormal findings: Secondary | ICD-10-CM | POA: Diagnosis not present

## 2024-01-31 DIAGNOSIS — E782 Mixed hyperlipidemia: Secondary | ICD-10-CM | POA: Diagnosis not present

## 2024-01-31 DIAGNOSIS — Z1231 Encounter for screening mammogram for malignant neoplasm of breast: Secondary | ICD-10-CM | POA: Diagnosis not present

## 2024-01-31 DIAGNOSIS — E66811 Obesity, class 1: Secondary | ICD-10-CM

## 2024-01-31 LAB — MICROALBUMIN, URINE WAIVED
Creatinine, Urine Waived: 200 mg/dL (ref 10–300)
Microalb, Ur Waived: 30 mg/L — ABNORMAL HIGH (ref 0–19)
Microalb/Creat Ratio: <30 mg/g (ref <30)

## 2024-01-31 MED ORDER — AMLODIPINE BESYLATE 5 MG PO TABS: 5.0000 mg | ORAL_TABLET | Freq: Every day | ORAL | 4 refills | Status: DC

## 2024-01-31 MED ORDER — ROSUVASTATIN CALCIUM 10 MG PO TABS
10.0000 mg | ORAL_TABLET | Freq: Every day | ORAL | 4 refills | Status: DC
Start: 2024-01-31 — End: 2024-09-25

## 2024-01-31 MED ORDER — LOSARTAN POTASSIUM 100 MG PO TABS: 100.0000 mg | ORAL_TABLET | Freq: Every day | ORAL | 4 refills | Status: DC

## 2024-01-31 NOTE — Assessment & Plan Note (Addendum)
BMI 33.90. Recommended eating smaller high protein, low fat meals more frequently and exercising 30 mins a day 5 times a week with a goal of 10-15lb weight loss in the next 3 months. Patient voiced their understanding and motivation to adhere to these recommendations.  Would benefit from Ozempic, but insurance is not covering.

## 2024-01-31 NOTE — Assessment & Plan Note (Signed)
Chronic, stable.  BP at goal in office today. Will continue current medication regimen and adjust as needed.  Continue to monitor BP at home regularly and focus on DASH diet.  Refills sent.  LABS: CBC, CMP, TSH, urine ALB.  Return in 6 months.

## 2024-01-31 NOTE — Assessment & Plan Note (Signed)
Chronic, ongoing with some improvement recent scan.  Vit D level today.  Next scan due 04/14/2026.

## 2024-01-31 NOTE — Assessment & Plan Note (Signed)
Ongoing. Continue Rosuvastatin at current dosing and adjust as needed.  Lipid panel today.

## 2024-01-31 NOTE — Progress Notes (Signed)
BP 126/80 (BP Location: Left Arm, Patient Position: Sitting)   Pulse 72   Temp 97.7 F (36.5 C) (Oral)   Ht 5' 7.3" (1.709 m)   Wt 218 lb 6.4 oz (99.1 kg)   LMP  (LMP Unknown)   SpO2 97%   BMI 33.90 kg/m    Subjective:    Patient ID: Mary Wood, female    DOB: 05/27/1956, 68 y.o.   MRN: [id number]  HPI: Mary Wood is a 68 y.o. female presenting on 01/31/2024 for comprehensive medical examination + Medicare Wellness exam. Current medical complaints include:none  She currently lives with: husband Menopausal Symptoms: no   HYPERTENSION Taking Losartan. Amlodipine, and Rosuvastatin. Is trying to walk more at present. Had both knees replaced over past year. Hypertension status: controlled  Satisfied with current treatment? yes Duration of hypertension: chronic BP monitoring frequency:  rarely BP range: <130/80 range BP medication side effects:  no Medication compliance: good compliance Aspirin: yes Recurrent headaches: no  Visual changes: no Palpitations: no Dyspnea: no Chest pain: no Lower extremity edema: from recent knee surgery left side Dizzy/lightheaded: no  The ASCVD Risk score (Arnett DK, et al., 2019) failed to calculate for the following reasons:   The valid total cholesterol range is 130 to 320 mg/dL  OSTEOPENIA Last DEXA 05/22/61. The BMD measured at AP Spine L1-L4 (L3) is 0.945 g/cm2 with a T-score of -1.9.  Satisfied with current treatment?: yes Adequate calcium & vitamin D: yes Weight bearing exercises: yes   Depression Screen done today and results listed below:     01/31/2024    8:03 AM 07/29/2023    8:43 AM 03/22/2023   11:14 AM 01/28/2023    8:53 AM 01/12/2023    2:33 PM  Depression screen PHQ 2/9  Decreased Interest 0 0 0 0 0  Down, Depressed, Hopeless 0 0 0 0 0  PHQ - 2 Score 0 0 0 0 0  Altered sleeping 0 0 0 0 1  Tired, decreased energy 0 0 0 0 1  Change in appetite 0 0 0 0 0  Feeling bad or failure about yourself  0 0 0 0 0  Trouble  concentrating 0 0 0 0 0  Moving slowly or fidgety/restless 0 0 0 0 0  Suicidal thoughts 0 0 0 0 0  PHQ-9 Score 0 0 0 0 2  Difficult doing work/chores Not difficult at all Not difficult at all Not difficult at all Not difficult at all Not difficult at all      01/31/2024    8:04 AM 07/29/2023    8:43 AM 03/22/2023   11:15 AM 01/28/2023    8:53 AM  GAD 7 : Generalized Anxiety Score  Nervous, Anxious, on Edge 0 0 1 0  Control/stop worrying 0 0 0 0  Worry too much - different things 0 0 0 0  Trouble relaxing 0 0 0 0  Restless 0 0 0 0  Easily annoyed or irritable 0 0 0 0  Afraid - awful might happen 0 0 0 0  Total GAD 7 Score 0 0 1 0  Anxiety Difficulty Not difficult at all Not difficult at all Not difficult at all Not difficult at all      01/12/2023    2:33 PM 01/28/2023    8:53 AM 03/22/2023   11:14 AM 07/29/2023    8:43 AM 01/31/2024    8:03 AM  Fall Risk  Falls in the past year? 0 0 0 0  0  Was there an injury with Fall? 0 0 0 0 0  Fall Risk Category Calculator 0 0 0 0 0  Patient at Risk for Falls Due to No Fall Risks No Fall Risks No Fall Risks No Fall Risks No Fall Risks  Fall risk Follow up  Falls evaluation completed Falls evaluation completed Falls evaluation completed Falls evaluation completed    Functional Status Survey: Is the patient deaf or have difficulty hearing?: No Does the patient have difficulty seeing, even when wearing glasses/contacts?: No Does the patient have difficulty concentrating, remembering, or making decisions?: No Does the patient have difficulty walking or climbing stairs?: No Does the patient have difficulty dressing or bathing?: No Does the patient have difficulty doing errands alone such as visiting a doctor's office or shopping?: No   Past Medical History:  Past Medical History:  Diagnosis Date   Allergy    Cat   Arthritis    knees   Chicken pox    Degenerative disc disease, lumbar    Depression 2012   When mother passed   Headache     Hypertension    Osteoporosis    Primary osteoarthritis of left knee    Shingles     Surgical History:  Past Surgical History:  Procedure Laterality Date   BREAST BIOPSY Right 03/30/2023   [place] BX, Heart Clip, path pending   BREAST BIOPSY Right 03/30/2023   [place] RT BREAST BX W LOC DEV 1ST LESION IMG BX SPEC US GUIDE 03/30/2023 [facility]   CHONDROPLASTY Right    FRACTURE SURGERY Right 06/25/2011   wrist   FRACTURE SURGERY Left 06/25/2011   elbow   GANGLION CYST EXCISION Left 1994   JOINT REPLACEMENT  07/05/2011   KNEE ARTHROPLASTY Right 08/31/2022   Procedure: COMPUTER ASSISTED TOTAL KNEE ARTHROPLASTY;  Surgeon: Donato Heinz, MD;  Location: [facility];  Service: Orthopedics;  Laterality: Right;   KNEE ARTHROPLASTY Left 12/20/2023   Procedure: COMPUTER ASSISTED TOTAL KNEE ARTHROPLASTY;  Surgeon: Donato Heinz, MD;  Location: [facility];  Service: Orthopedics;  Laterality: Left;   KNEE ARTHROSCOPY Right 05/07/2021   Procedure: ARTHROSCOPY KNEE;  Surgeon: Donato Heinz, MD;  Location: [facility];  Service: Orthopedics;  Laterality: Right;   KNEE ARTHROSCOPY Left 04/08/2022   Procedure: ARTHROSCOPY KNEE;  Surgeon: Donato Heinz, MD;  Location: [facility];  Service: Orthopedics;  Laterality: Left;   KNEE ARTHROSCOPY W/ PARTIAL MEDIAL MENISCECTOMY Right    TONSILLECTOMY      Medications:  Current Outpatient Medications on File Prior to Visit  Medication Sig   acetaminophen (TYLENOL) 650 MG CR tablet Take 650 mg by mouth every 6 (six) hours as needed for pain.   aspirin 81 MG chewable tablet Chew 1 tablet (81 mg total) by mouth 2 (two) times daily.   calcium-vitamin D (OSCAL WITH D) 500-5 MG-MCG tablet Take 2 tablets by mouth daily.   celecoxib (CELEBREX) 200 MG capsule Take 1 capsule (200 mg total) by mouth 2 (two) times daily.   cholecalciferol (VITAMIN D3) 25 MCG (1000 UNIT) tablet Take 5,000 Units by mouth daily.   Misc Natural Products (OSTEO BI-FLEX/5-LOXIN ADVANCED) TABS Take  2 tablets by mouth daily.   oxyCODONE (OXY IR/ROXICODONE) 5 MG immediate release tablet Take 1 tablet (5 mg total) by mouth every 4 (four) hours as needed for moderate pain (pain score 4-6) (pain score 4-6).   traMADol (ULTRAM) 50 MG tablet Take 1-2 tablets (50-100 mg total) by mouth every 4 (four) hours  as needed for moderate pain (pain score 4-6).   No current facility-administered medications on file prior to visit.    Allergies:  Allergies  Allergen Reactions   Cat Dander     Stuffy nose itchy eyes    Social History:  Social History   Socioeconomic History   Marital status: Married    Spouse name: Tammy Sours   Number of children: 2   Years of education: Not on file   Highest education level: Bachelor's degree (e.g., BA, AB, BS)  Occupational History   Not on file  Tobacco Use   Smoking status: Never   Smokeless tobacco: Never  Vaping Use   Vaping status: Never Used  Substance and Sexual Activity   Alcohol use: Yes    Alcohol/week: 5.0 standard drinks of alcohol    Types: 2 Glasses of wine, 3 Standard drinks or equivalent per week    Comment: on occassion   Drug use: No   Sexual activity: Yes    Birth control/protection: Post-menopausal  Other Topics Concern   Not on file  Social History Narrative   Lives at home with husband   Social Drivers of Health   Financial Resource Strain: Low Risk  (01/27/2024)   Overall Financial Resource Strain (CARDIA)    Difficulty of Paying Living Expenses: Not hard at all  Food Insecurity: No Food Insecurity (01/27/2024)   Hunger Vital Sign    Worried About Running Out of Food in the Last Year: Never true    Ran Out of Food in the Last Year: Never true  Transportation Needs: No Transportation Needs (01/27/2024)   PRAPARE - Administrator, Civil Service (Medical): No    Lack of Transportation (Non-Medical): No  Physical Activity: Insufficiently Active (01/27/2024)   Exercise Vital Sign    Days of Exercise per Week: 2 days     Minutes of Exercise per Session: 20 min  Stress: No Stress Concern Present (01/27/2024)   [employer] of Occupational Health - Occupational Stress Questionnaire    Feeling of Stress : Only a little  Social Connections: Moderately Integrated (01/27/2024)   Social Connection and Isolation Panel [NHANES]    Frequency of Communication with Friends and Family: More than three times a week    Frequency of Social Gatherings with Friends and Family: Three times a week    Attends Religious Services: Never    Active Member of Clubs or Organizations: Yes    Attends Banker Meetings: More than 4 times per year    Marital Status: Married  Catering manager Violence: Not At Risk (01/31/2024)   Humiliation, Afraid, Rape, and Kick questionnaire    Fear of Current or Ex-Partner: No    Emotionally Abused: No    Physically Abused: No    Sexually Abused: No   Social History   Tobacco Use  Smoking Status Never  Smokeless Tobacco Never   Social History   Substance and Sexual Activity  Alcohol Use Yes   Alcohol/week: 5.0 standard drinks of alcohol   Types: 2 Glasses of wine, 3 Standard drinks or equivalent per week   Comment: on occassion    Family History:  Family History  Problem Relation Age of Onset   Heart disease Mother    Hypertension Mother    Depression Mother    Alcohol abuse Father    COPD Father    Hyperthyroidism Sister    Hypertension Sister    Cancer Maternal Grandmother    Heart disease Maternal  Grandfather    Alcohol abuse Brother    Cancer Brother        colon   Hypertension Brother    Depression Brother    Osteoporosis Brother    Breast cancer Neg Hx     Past medical history, surgical history, medications, allergies, family history and social history reviewed with patient today and changes made to appropriate areas of the chart.   Review of Systems - negative All other ROS negative except what is listed above and in the HPI.      Objective:     BP 126/80 (BP Location: Left Arm, Patient Position: Sitting)   Pulse 72   Temp 97.7 F (36.5 C) (Oral)   Ht 5' 7.3" (1.709 m)   Wt 218 lb 6.4 oz (99.1 kg)   LMP  (LMP Unknown)   SpO2 97%   BMI 33.90 kg/m   Wt Readings from Last 3 Encounters:  01/31/24 218 lb 6.4 oz (99.1 kg)  12/20/23 212 lb (96.2 kg)  12/17/23 217 lb (98.4 kg)    Physical Exam Vitals and nursing note reviewed.  Constitutional:      General: She is awake. She is not in acute distress.    Appearance: She is well-developed. She is not ill-appearing.  HENT:     Head: Normocephalic and atraumatic.     Right Ear: Hearing, tympanic membrane, ear canal and external ear normal. No drainage.     Left Ear: Hearing, tympanic membrane, ear canal and external ear normal. No drainage.     Nose: Nose normal.     Right Sinus: No maxillary sinus tenderness or frontal sinus tenderness.     Left Sinus: No maxillary sinus tenderness or frontal sinus tenderness.     Mouth/Throat:     Mouth: Mucous membranes are moist.     Pharynx: Oropharynx is clear. Uvula midline. No pharyngeal swelling, oropharyngeal exudate or posterior oropharyngeal erythema.  Eyes:     General: Lids are normal.        Right eye: No discharge.        Left eye: No discharge.     Extraocular Movements: Extraocular movements intact.     Conjunctiva/sclera: Conjunctivae normal.     Pupils: Pupils are equal, round, and reactive to light.     Visual Fields: Right eye visual fields normal and left eye visual fields normal.  Neck:     Thyroid: No thyromegaly.     Vascular: No carotid bruit.     Trachea: Trachea normal.  Cardiovascular:     Rate and Rhythm: Normal rate and regular rhythm.     Heart sounds: Normal heart sounds. No murmur heard.    No gallop.  Pulmonary:     Effort: Pulmonary effort is normal. No accessory muscle usage or respiratory distress.     Breath sounds: Normal breath sounds.  Chest:  Breasts:    Right: Normal.     Left: Normal.   Abdominal:     General: Bowel sounds are normal.     Palpations: Abdomen is soft. There is no hepatomegaly or splenomegaly.     Tenderness: There is no abdominal tenderness.  Musculoskeletal:        General: Normal range of motion.     Cervical back: Normal range of motion and neck supple.     Right lower leg: No edema.     Left lower leg: No edema.  Lymphadenopathy:     Head:     Right side of  head: No submental, submandibular, tonsillar, preauricular or posterior auricular adenopathy.     Left side of head: No submental, submandibular, tonsillar, preauricular or posterior auricular adenopathy.     Cervical: No cervical adenopathy.     Upper Body:     Right upper body: No supraclavicular, axillary or pectoral adenopathy.     Left upper body: No supraclavicular, axillary or pectoral adenopathy.  Skin:    General: Skin is warm and dry.     Capillary Refill: Capillary refill takes less than 2 seconds.     Findings: No rash.  Neurological:     Mental Status: She is alert and oriented to person, place, and time.     Gait: Gait is intact.     Deep Tendon Reflexes: Reflexes are normal and symmetric.     Reflex Scores:      Brachioradialis reflexes are 2+ on the right side and 2+ on the left side.      Patellar reflexes are 2+ on the right side and 2+ on the left side. Psychiatric:        Attention and Perception: Attention normal.        Mood and Affect: Mood normal.        Speech: Speech normal.        Behavior: Behavior normal. Behavior is cooperative.        Thought Content: Thought content normal.        Judgment: Judgment normal.       01/31/2024    8:01 AM 01/28/2023    9:02 AM  6CIT Screen  What Year? 0 points 0 points  What month? 0 points 0 points  What time? 0 points 0 points  Count back from 20 0 points 0 points  Months in reverse 0 points 0 points  Repeat phrase 0 points 2 points  Total Score 0 points 2 points   Results for orders placed or performed during the  hospital encounter of 12/17/23  C-reactive protein   Collection Time: 12/17/23  2:40 PM  Result Value Ref Range   CRP 0.8 <1.0 mg/dL  Sedimentation rate   Collection Time: 12/17/23  2:40 PM  Result Value Ref Range   Sed Rate 11 0 - 30 mm/hr  CBC   Collection Time: 12/17/23  2:40 PM  Result Value Ref Range   WBC 6.1 4.0 - 10.5 K/uL   RBC 4.06 3.87 - 5.11 MIL/uL   Hemoglobin 12.8 12.0 - 15.0 g/dL   HCT 10.2 58.5 - 27.7 %   MCV 93.8 80.0 - 100.0 fL   MCH 31.5 26.0 - 34.0 pg   MCHC 33.6 30.0 - 36.0 g/dL   RDW 82.4 23.5 - 36.1 %   Platelets 363 150 - 400 K/uL   nRBC 0.0 0.0 - 0.2 %  Comprehensive metabolic panel   Collection Time: 12/17/23  2:40 PM  Result Value Ref Range   Sodium 141 135 - 145 mmol/L   Potassium 3.4 (L) 3.5 - 5.1 mmol/L   Chloride 102 98 - 111 mmol/L   CO2 25 22 - 32 mmol/L   Glucose, Bld 112 (H) 70 - 99 mg/dL   BUN 16 8 - 23 mg/dL   Creatinine, Ser 4.43 0.44 - 1.00 mg/dL   Calcium 9.0 8.9 - 15.4 mg/dL   Total Protein 7.8 6.5 - 8.1 g/dL   Albumin 4.3 3.5 - 5.0 g/dL   AST 22 15 - 41 U/L   ALT 20 0 - 44 U/L  Alkaline Phosphatase 62 38 - 126 U/L   Total Bilirubin 0.4 0.0 - 1.2 mg/dL   GFR, Estimated >84 >69 mL/min   Anion gap 14 5 - 15  Urinalysis, Routine w reflex microscopic -Urine, Clean Catch   Collection Time: 12/17/23  2:40 PM  Result Value Ref Range   Color, Urine STRAW (A) YELLOW   APPearance CLEAR (A) CLEAR   Specific Gravity, Urine 1.008 1.005 - 1.030   pH 6.0 5.0 - 8.0   Glucose, UA NEGATIVE NEGATIVE mg/dL   Hgb urine dipstick NEGATIVE NEGATIVE   Bilirubin Urine NEGATIVE NEGATIVE   Ketones, ur NEGATIVE NEGATIVE mg/dL   Protein, ur NEGATIVE NEGATIVE mg/dL   Nitrite NEGATIVE NEGATIVE   Leukocytes,Ua NEGATIVE NEGATIVE  Surgical pcr screen   Collection Time: 12/17/23  2:41 PM   Specimen: Nasal Mucosa; Nasal Swab  Result Value Ref Range   MRSA, PCR NEGATIVE NEGATIVE   Staphylococcus aureus NEGATIVE NEGATIVE      Assessment & Plan:    Problem List Items Addressed This Visit       Cardiovascular and Mediastinum   Hypertension   Chronic, stable.  BP at goal in office today. Will continue current medication regimen and adjust as needed.  Continue to monitor BP at home regularly and focus on DASH diet.  Refills sent.  LABS: CBC, CMP, TSH, urine ALB.  Return in 6 months.      Relevant Medications   losartan (COZAAR) 100 MG tablet   amLODipine (NORVASC) 5 MG tablet   rosuvastatin (CRESTOR) 10 MG tablet   Other Relevant Orders   Microalbumin, Urine Waived   CBC with Differential/Platelet   Comprehensive metabolic panel   TSH     Musculoskeletal and Integument   Osteopenia of lumbar spine   Chronic, ongoing with some improvement recent scan.  Vit D level today.  Next scan due 04/14/2026.      Relevant Orders   VITAMIN D 25 Hydroxy (Vit-D Deficiency, Fractures)     Other   Mixed hyperlipidemia   Ongoing. Continue Rosuvastatin at current dosing and adjust as needed.  Lipid panel today.      Relevant Medications   losartan (COZAAR) 100 MG tablet   amLODipine (NORVASC) 5 MG tablet   rosuvastatin (CRESTOR) 10 MG tablet   Other Relevant Orders   Comprehensive metabolic panel   Lipid Panel w/o Chol/HDL Ratio   Obesity   BMI 62.95. Recommended eating smaller high protein, low fat meals more frequently and exercising 30 mins a day 5 times a week with a goal of 10-15lb weight loss in the next 3 months. Patient voiced their understanding and motivation to adhere to these recommendations.  Would benefit from Ozempic, but insurance is not covering.       Other Visit Diagnoses       Medicare annual wellness visit, subsequent    -  Primary   Medicare wellness performed with patient today.     Encounter for screening mammogram for malignant neoplasm of breast       Mammogram ordered.   Relevant Orders   MM 3D SCREENING MAMMOGRAM BILATERAL BREAST     Encounter for annual physical exam       Annual physical today with  labs and health maintenance reviewed, discussed with patient.        Follow up plan: Return in about 6 months (around 07/30/2024) for HTN/HLD.   LABORATORY TESTING:  - Pap smear: not applicable -- last pap 2018 was negative HPV and  cytology, over age 74  IMMUNIZATIONS:  - Tetanus vaccination status reviewed: last tetanus booster within 10 years. - Influenza: Up To Date - Pneumovax: Up to date - Prevnar: Up to date - HPV: Not applicable - Zostavax vaccine: Up to date x 1, needs second one - Covid: Refuses  SCREENING: -Mammogram: Up to date = due next 03/23/24 - Colonoscopy: Up to date -- due next 03/28/2028 - Bone Density: Up to date due next 04/14/2026 -Hearing Test: Not applicable  -Spirometry: Not applicable   PATIENT COUNSELING:   Advised to take 1 mg of folate supplement per day if capable of pregnancy.   Sexuality: Discussed sexually transmitted diseases, partner selection, use of condoms, avoidance of unintended pregnancy  and contraceptive alternatives.   Advised to avoid cigarette smoking.  I discussed with the patient that most people either abstain from alcohol or drink within safe limits (<=14/week and <=4 drinks/occasion for males, <=7/weeks and <= 3 drinks/occasion for females) and that the risk for alcohol disorders and other health effects rises proportionally with the number of drinks per week and how often a drinker exceeds daily limits.  Discussed cessation/primary prevention of drug use and availability of treatment for abuse.   Diet: Encouraged to adjust caloric intake to maintain  or achieve ideal body weight, to reduce intake of dietary saturated fat and total fat, to limit sodium intake by avoiding high sodium foods and not adding table salt, and to maintain adequate dietary potassium and calcium preferably from fresh fruits, vegetables, and low-fat dairy products.    Stressed the importance of regular exercise  Injury prevention: Discussed safety belts,  safety helmets, smoke detector, smoking near bedding or upholstery.   Dental health: Discussed importance of regular tooth brushing, flossing, and dental visits.    NEXT PREVENTATIVE PHYSICAL DUE IN 1 YEAR. Return in about 6 months (around 07/30/2024) for HTN/HLD.

## 2024-02-01 ENCOUNTER — Encounter: Payer: Self-pay | Admitting: Nurse Practitioner

## 2024-02-01 DIAGNOSIS — Z96652 Presence of left artificial knee joint: Secondary | ICD-10-CM | POA: Diagnosis not present

## 2024-02-01 DIAGNOSIS — M6281 Muscle weakness (generalized): Secondary | ICD-10-CM | POA: Diagnosis not present

## 2024-02-01 DIAGNOSIS — M25562 Pain in left knee: Secondary | ICD-10-CM | POA: Diagnosis not present

## 2024-02-01 DIAGNOSIS — M25662 Stiffness of left knee, not elsewhere classified: Secondary | ICD-10-CM | POA: Diagnosis not present

## 2024-02-01 DIAGNOSIS — G8929 Other chronic pain: Secondary | ICD-10-CM | POA: Diagnosis not present

## 2024-02-01 LAB — CBC WITH DIFFERENTIAL/PLATELET
Basophils Absolute: 0 10*3/uL (ref 0.0–0.2)
Basos: 0 %
EOS (ABSOLUTE): 0.2 10*3/uL (ref 0.0–0.4)
Eos: 3 %
Hematocrit: 39.6 % (ref 34.0–46.6)
Hemoglobin: 13.1 g/dL (ref 11.1–15.9)
Immature Grans (Abs): 0 10*3/uL (ref 0.0–0.1)
Immature Granulocytes: 0 %
Lymphocytes Absolute: 2.6 10*3/uL (ref 0.7–3.1)
Lymphs: 40 %
MCH: 30.8 pg (ref 26.6–33.0)
MCHC: 33.1 g/dL (ref 31.5–35.7)
MCV: 93 fL (ref 79–97)
Monocytes Absolute: 0.6 10*3/uL (ref 0.1–0.9)
Monocytes: 9 %
Neutrophils Absolute: 3.2 10*3/uL (ref 1.4–7.0)
Neutrophils: 48 %
Platelets: 392 10*3/uL (ref 150–450)
RBC: 4.25 x10E6/uL (ref 3.77–5.28)
RDW: 11.8 % (ref 11.7–15.4)
WBC: 6.6 10*3/uL (ref 3.4–10.8)

## 2024-02-01 LAB — COMPREHENSIVE METABOLIC PANEL
ALT: 16 [IU]/L (ref 0–32)
AST: 16 [IU]/L (ref 0–40)
Albumin: 4.6 g/dL (ref 3.9–4.9)
Alkaline Phosphatase: 91 [IU]/L (ref 44–121)
BUN/Creatinine Ratio: 27 (ref 12–28)
BUN: 18 mg/dL (ref 8–27)
Bilirubin Total: 0.5 mg/dL (ref 0.0–1.2)
CO2: 23 mmol/L (ref 20–29)
Calcium: 9.2 mg/dL (ref 8.7–10.3)
Chloride: 102 mmol/L (ref 96–106)
Creatinine, Ser: 0.66 mg/dL (ref 0.57–1.00)
Globulin, Total: 3 g/dL (ref 1.5–4.5)
Glucose: 89 mg/dL (ref 70–99)
Potassium: 4.3 mmol/L (ref 3.5–5.2)
Sodium: 140 mmol/L (ref 134–144)
Total Protein: 7.6 g/dL (ref 6.0–8.5)
eGFR: 95 mL/min/{1.73_m2} (ref >59)

## 2024-02-01 LAB — TSH: TSH: 1.6 u[IU]/mL (ref 0.450–4.500)

## 2024-02-01 LAB — VITAMIN D 25 HYDROXY (VIT D DEFICIENCY, FRACTURES): Vit D, 25-Hydroxy: 50.4 ng/mL (ref 30.0–100.0)

## 2024-02-01 LAB — LIPID PANEL W/O CHOL/HDL RATIO
Cholesterol, Total: 126 mg/dL (ref 100–199)
HDL: 54 mg/dL (ref >39)
LDL Chol Calc (NIH): 55 mg/dL (ref 0–99)
Triglycerides: 92 mg/dL (ref 0–149)
VLDL Cholesterol Cal: 17 mg/dL (ref 5–40)

## 2024-02-01 NOTE — Progress Notes (Signed)
Contacted via MyChart   Good morning Mary Wood, your labs have returned and overall look fantastic.  No medication changes needed.  Any questions about these? Keep being amazing!!  Thank you for allowing me to participate in your care.  I appreciate you. Kindest regards, Cuthbert Turton

## 2024-04-23 ENCOUNTER — Encounter: Payer: Self-pay | Admitting: Nurse Practitioner

## 2024-05-10 ENCOUNTER — Ambulatory Visit
Admission: RE | Admit: 2024-05-10 | Discharge: 2024-05-10 | Disposition: A | Discharge status: Home / Self Care | Source: Ambulatory Visit | Attending: Nurse Practitioner | Admitting: Nurse Practitioner

## 2024-05-10 DIAGNOSIS — Z1231 Encounter for screening mammogram for malignant neoplasm of breast: Secondary | ICD-10-CM | POA: Diagnosis present

## 2024-05-11 ENCOUNTER — Encounter: Payer: Self-pay | Admitting: Nurse Practitioner

## 2024-05-11 ENCOUNTER — Ambulatory Visit
Admission: RE | Admit: 2024-05-11 | Discharge: 2024-05-11 | Disposition: A | Discharge status: Home / Self Care | Source: Ambulatory Visit | Attending: Nurse Practitioner | Admitting: Nurse Practitioner

## 2024-05-11 ENCOUNTER — Ambulatory Visit: Admitting: Nurse Practitioner

## 2024-05-11 ENCOUNTER — Ambulatory Visit: Payer: Self-pay | Admitting: Nurse Practitioner

## 2024-05-11 VITALS — BP 138/86 | HR 67 | Temp 97.5°F | Ht 67.0 in | Wt 220.6 lb

## 2024-05-11 DIAGNOSIS — M51362 Other intervertebral disc degeneration, lumbar region with discogenic back pain and lower extremity pain: Secondary | ICD-10-CM | POA: Diagnosis present

## 2024-05-11 MED ORDER — PREDNISONE 10 MG PO TABS: ORAL_TABLET | ORAL | 0 refills | Status: DC

## 2024-05-11 MED ORDER — TRAMADOL HCL 50 MG PO TABS: 50.0000 mg | ORAL_TABLET | Freq: Four times a day (QID) | ORAL | 0 refills | Status: AC | PRN

## 2024-05-11 NOTE — Progress Notes (Signed)
 Contacted via MyChart   Imaging has returned, you continue to show degenerative disc as we discussed and this has not changed much. Good news!!  I suspect the steroid taper will help a lot.:)

## 2024-05-11 NOTE — Assessment & Plan Note (Signed)
 Chronic with current acute flare for one month.  Will repeat imaging as has been 5 years since last imaging. Start Prednisone  taper and Tramadol  PRN.  Recommend she stop taking Ibuprofen, take Tylenol  only.  Suspect Ibuprofen has caused some increase in BP.  Use OTC Icy/Hot Lidocaine  patches.  Ensure stretching at home and continue heat pad.  No red flags on exam.  She is to alert provider if no improvement once done steroid taper, if continues to have pain will send to PT.

## 2024-05-11 NOTE — Patient Instructions (Signed)
 Acute Back Pain, Adult Acute back pain is sudden and usually short-lived. It is often caused by an injury to the muscles and tissues in the back. The injury may result from: A muscle, tendon, or ligament getting overstretched or torn. Ligaments are tissues that connect bones to each other. Lifting something improperly can cause a back strain. Wear and tear (degeneration) of the spinal disks. Spinal disks are circular tissue that provide cushioning between the bones of the spine (vertebrae). Twisting motions, such as while playing sports or doing yard work. A hit to the back. Arthritis. You may have a physical exam, lab tests, and imaging tests to find the cause of your pain. Acute back pain usually goes away with rest and home care. Follow these instructions at home: Managing pain, stiffness, and swelling Take over-the-counter and prescription medicines only as told by your health care provider. Treatment may include medicines for pain and inflammation that are taken by mouth or applied to the skin, or muscle relaxants. Your health care provider may recommend applying ice during the first 24-48 hours after your pain starts. To do this: Put ice in a plastic bag. Place a towel between your skin and the bag. Leave the ice on for 20 minutes, 2-3 times a day. Remove the ice if your skin turns bright red. This is very important. If you cannot feel pain, heat, or cold, you have a greater risk of damage to the area. If directed, apply heat to the affected area as often as told by your health care provider. Use the heat source that your health care provider recommends, such as a moist heat pack or a heating pad. Place a towel between your skin and the heat source. Leave the heat on for 20-30 minutes. Remove the heat if your skin turns bright red. This is especially important if you are unable to feel pain, heat, or cold. You have a greater risk of getting burned. Activity  Do not stay in bed. Staying in  bed for more than 1-2 days can delay your recovery. Sit up and stand up straight. Avoid leaning forward when you sit or hunching over when you stand. If you work at a desk, sit close to it so you do not need to lean over. Keep your chin tucked in. Keep your neck drawn back, and keep your elbows bent at a 90-degree angle (right angle). Sit high and close to the steering wheel when you drive. Add lower back (lumbar) support to your car seat, if needed. Take short walks on even surfaces as soon as you are able. Try to increase the length of time you walk each day. Do not sit, drive, or stand in one place for more than 30 minutes at a time. Sitting or standing for long periods of time can put stress on your back. Do not drive or use heavy machinery while taking prescription pain medicine. Use proper lifting techniques. When you bend and lift, use positions that put less stress on your back: [place] your knees. Keep the load close to your body. Avoid twisting. Exercise regularly as told by your health care provider. Exercising helps your back heal faster and helps prevent back injuries by keeping muscles strong and flexible. Work with a physical therapist to make a safe exercise program, as recommended by your health care provider. Do any exercises as told by your physical therapist. Lifestyle Maintain a healthy weight. Extra weight puts stress on your back and makes it difficult to have good  posture. Avoid activities or situations that make you feel anxious or stressed. Stress and anxiety increase muscle tension and can make back pain worse. Learn ways to manage anxiety and stress, such as through exercise. General instructions Sleep on a firm mattress in a comfortable position. Try lying on your side with your knees slightly bent. If you lie on your back, put a pillow under your knees. Keep your head and neck in a straight line with your spine (neutral position) when using electronic equipment like  smartphones or pads. To do this: Raise your smartphone or pad to look at it instead of bending your head or neck to look down. Put the smartphone or pad at the level of your face while looking at the screen. Follow your treatment plan as told by your health care provider. This may include: Cognitive or behavioral therapy. Acupuncture or massage therapy. Meditation or yoga. Contact a health care provider if: You have pain that is not relieved with rest or medicine. You have increasing pain going down into your legs or buttocks. Your pain does not improve after 2 weeks. You have pain at night. You lose weight without trying. You have a fever or chills. You develop nausea or vomiting. You develop abdominal pain. Get help right away if: You develop new bowel or bladder control problems. You have unusual weakness or numbness in your arms or legs. You feel faint. These symptoms may represent a serious problem that is an emergency. Do not wait to see if the symptoms will go away. Get medical help right away. Call your local emergency services (911 in the [place] Do not drive yourself to the hospital. Summary Acute back pain is sudden and usually short-lived. Use proper lifting techniques. When you bend and lift, use positions that put less stress on your back. Take over-the-counter and prescription medicines only as told by your health care provider, and apply heat or ice as told. This information is not intended to replace advice given to you by your health care provider. Make sure you discuss any questions you have with your health care provider. Document Revised: 02/21/2021 Document Reviewed: 02/21/2021 Elsevier Patient Education  2024 [employer].

## 2024-05-11 NOTE — Progress Notes (Signed)
 BP 138/86 (BP Location: Left Arm, Patient Position: Sitting, Cuff Size: Normal) Comment: has been taking Ibuprofen  Pulse 67   Temp (!) 97.5 F (36.4 C) (Oral)   Ht 5\' 7"  (1.702 m)   Wt 220 lb 9.6 oz (100.1 kg)   LMP  (LMP Unknown)   SpO2 97%   BMI 34.55 kg/m    Subjective:    Patient ID: Mary Wood, female    DOB: 05/11/1956, 68 y.o.   MRN: [id number]  HPI: Mary Wood is a 68 y.o. female  Chief Complaint  Patient presents with   Pain    Patient states she has been having issues with R sided sciatic pain for the last month. States she feels the pain worse when sitting on a hard surface or driving for long periods. States she feels the numbness in her thigh when doing these things.    BACK PAIN Has been having sciatic pain for one month. Recently took some leftover Tramadol  which helped a little. Last imaging was on 12/21/2018 -- mild degenerative disc, worse to L5-S1.   Duration: weeks Mechanism of injury: no trauma Location: Right and low back Onset: sudden Severity: 5/10 today, but has been worse Quality: sharp, dull, aching, and throbbing Frequency: constant but varies in strength Radiation: R leg below the knee Aggravating factors: lifting, movement, walking, bending, and prolonged sitting Alleviating factors: rest, heat, and narcotics Status: fluctuating Treatments attempted: rest, heat, APAP, and ibuprofen , Tramadol  Relief with NSAIDs?: mild Nighttime pain:  no Paresthesias / decreased sensation:  no Bowel / bladder incontinence:  no Fevers:  no Dysuria / urinary frequency:  no   Relevant past medical, surgical, family and social history reviewed and updated as indicated. Interim medical history since our last visit reviewed. Allergies and medications reviewed and updated.  Review of Systems  Constitutional:  Negative for activity change, appetite change, diaphoresis, fatigue and fever.  Respiratory:  Negative for cough, chest tightness and shortness of  breath.   Cardiovascular:  Negative for chest pain, palpitations and leg swelling.  Gastrointestinal: Negative.   Musculoskeletal:  Positive for back pain.  Neurological: Negative.   Psychiatric/Behavioral: Negative.      Per HPI unless specifically indicated above     Objective:     BP 138/86 (BP Location: Left Arm, Patient Position: Sitting, Cuff Size: Normal) Comment: has been taking Ibuprofen  Pulse 67   Temp (!) 97.5 F (36.4 C) (Oral)   Ht 5\' 7"  (1.702 m)   Wt 220 lb 9.6 oz (100.1 kg)   LMP  (LMP Unknown)   SpO2 97%   BMI 34.55 kg/m   Wt Readings from Last 3 Encounters:  05/11/24 220 lb 9.6 oz (100.1 kg)  01/31/24 218 lb 6.4 oz (99.1 kg)  12/20/23 212 lb (96.2 kg)    Physical Exam Vitals and nursing note reviewed.  Constitutional:      General: She is awake. She is not in acute distress.    Appearance: She is well-developed and well-groomed. She is obese. She is not ill-appearing or toxic-appearing.  HENT:     Head: Normocephalic.     Right Ear: Hearing and external ear normal.     Left Ear: Hearing and external ear normal.  Eyes:     General: Lids are normal.        Right eye: No discharge.        Left eye: No discharge.     Conjunctiva/sclera: Conjunctivae normal.  Pupils: Pupils are equal, round, and reactive to light.  Neck:     Thyroid : No thyromegaly.     Vascular: No carotid bruit.  Cardiovascular:     Rate and Rhythm: Normal rate and regular rhythm.     Heart sounds: Normal heart sounds. No murmur heard.    No gallop.  Pulmonary:     Effort: Pulmonary effort is normal. No accessory muscle usage or respiratory distress.     Breath sounds: Normal breath sounds. No decreased breath sounds, wheezing or rales.  Abdominal:     General: Bowel sounds are normal. There is no distension.     Palpations: Abdomen is soft.     Tenderness: There is no abdominal tenderness.  Musculoskeletal:     Cervical back: Normal range of motion and neck supple.      Lumbar back: No swelling, spasms or tenderness. Normal range of motion. Negative right straight leg raise test and negative left straight leg raise test.     Right lower leg: No edema.     Left lower leg: No edema.     Comments: Discomfort to flexion and lateral right movement + rotation right.  No rashes noted.  Lymphadenopathy:     Cervical: No cervical adenopathy.  Skin:    General: Skin is warm and dry.  Neurological:     Mental Status: She is alert and oriented to person, place, and time.     Deep Tendon Reflexes: Reflexes are normal and symmetric.     Reflex Scores:      Brachioradialis reflexes are 2+ on the right side and 2+ on the left side.      Patellar reflexes are 2+ on the right side and 2+ on the left side. Psychiatric:        Attention and Perception: Attention normal.        Mood and Affect: Mood normal.        Speech: Speech normal.        Behavior: Behavior normal. Behavior is cooperative.        Thought Content: Thought content normal.    Results for orders placed or performed in visit on 01/31/24  Microalbumin, Urine Waived   Collection Time: 01/31/24  8:22 AM  Result Value Ref Range   Microalb, Ur Waived 30 (H) 0 - 19 mg/L   Creatinine, Urine Waived 200 10 - 300 mg/dL   Microalb/Creat Ratio <30 <30 mg/g  CBC with Differential/Platelet   Collection Time: 01/31/24  8:27 AM  Result Value Ref Range   WBC 6.6 3.4 - 10.8 x10E3/uL   RBC 4.25 3.77 - 5.28 x10E6/uL   Hemoglobin 13.1 11.1 - 15.9 g/dL   Hematocrit 16.1 09.6 - 46.6 %   MCV 93 79 - 97 fL   MCH 30.8 26.6 - 33.0 pg   MCHC 33.1 31.5 - 35.7 g/dL   RDW 04.5 40.9 - 81.1 %   Platelets 392 150 - 450 x10E3/uL   Neutrophils 48 Not Estab. %   Lymphs 40 Not Estab. %   Monocytes 9 Not Estab. %   Eos 3 Not Estab. %   Basos 0 Not Estab. %   Neutrophils Absolute 3.2 1.4 - 7.0 x10E3/uL   Lymphocytes Absolute 2.6 0.7 - 3.1 x10E3/uL   Monocytes Absolute 0.6 0.1 - 0.9 x10E3/uL   EOS (ABSOLUTE) 0.2 0.0 - 0.4  x10E3/uL   Basophils Absolute 0.0 0.0 - 0.2 x10E3/uL   Immature Granulocytes 0 Not Estab. %   Immature Grans (  Abs) 0.0 0.0 - 0.1 x10E3/uL  Comprehensive metabolic panel   Collection Time: 01/31/24  8:27 AM  Result Value Ref Range   Glucose 89 70 - 99 mg/dL   BUN 18 8 - 27 mg/dL   Creatinine, Ser 2.95 0.57 - 1.00 mg/dL   eGFR 95 >28 UX/LKG/4.01   BUN/Creatinine Ratio 27 12 - 28   Sodium 140 134 - 144 mmol/L   Potassium 4.3 3.5 - 5.2 mmol/L   Chloride 102 96 - 106 mmol/L   CO2 23 20 - 29 mmol/L   Calcium  9.2 8.7 - 10.3 mg/dL   Total Protein 7.6 6.0 - 8.5 g/dL   Albumin 4.6 3.9 - 4.9 g/dL   Globulin, Total 3.0 1.5 - 4.5 g/dL   Bilirubin Total 0.5 0.0 - 1.2 mg/dL   Alkaline Phosphatase 91 44 - 121 IU/L   AST 16 0 - 40 IU/L   ALT 16 0 - 32 IU/L  TSH   Collection Time: 01/31/24  8:27 AM  Result Value Ref Range   TSH 1.600 0.450 - 4.500 uIU/mL  Lipid Panel w/o Chol/HDL Ratio   Collection Time: 01/31/24  8:27 AM  Result Value Ref Range   Cholesterol, Total 126 100 - 199 mg/dL   Triglycerides 92 0 - 149 mg/dL   HDL 54 >02 mg/dL   VLDL Cholesterol Cal 17 5 - 40 mg/dL   LDL Chol Calc (NIH) 55 0 - 99 mg/dL  VITAMIN D  25 Hydroxy (Vit-D Deficiency, Fractures)   Collection Time: 01/31/24  8:27 AM  Result Value Ref Range   Vit D, 25-Hydroxy 50.4 30.0 - 100.0 ng/mL      Assessment & Plan:   Problem List Items Addressed This Visit       Musculoskeletal and Integument   Degenerative disc disease, lumbar - Primary   Chronic with current acute flare for one month.  Will repeat imaging as has been 5 years since last imaging. Start Prednisone  taper and Tramadol  PRN.  Recommend she stop taking Ibuprofen, take Tylenol  only.  Suspect Ibuprofen has caused some increase in BP.  Use OTC Icy/Hot Lidocaine  patches.  Ensure stretching at home and continue heat pad.  No red flags on exam.  She is to alert provider if no improvement once done steroid taper, if continues to have pain will send to PT.       Relevant Orders   DG Lumbar Spine Complete     Follow up plan: Return if symptoms worsen or fail to improve.

## 2024-05-12 ENCOUNTER — Encounter: Payer: Self-pay | Admitting: Nurse Practitioner

## 2024-05-15 ENCOUNTER — Ambulatory Visit: Payer: Self-pay | Admitting: Nurse Practitioner

## 2024-05-15 NOTE — Progress Notes (Signed)
 Contacted via MyChart   Normal mammogram, may repeat in one year:)

## 2024-07-31 ENCOUNTER — Ambulatory Visit: Payer: Medicare PPO | Admitting: Nurse Practitioner

## 2024-09-12 ENCOUNTER — Encounter: Payer: Self-pay | Admitting: Nurse Practitioner

## 2024-09-23 NOTE — Patient Instructions (Signed)
 Be Involved in Caring For Your Health:  Taking Medications When medications are taken as directed, they can greatly improve your health. But if they are not taken as prescribed, they may not work. In some cases, not taking them correctly can be harmful. To help ensure your treatment remains effective and safe, understand your medications and how to take them. Bring your medications to each visit for review by your provider.  Your lab results, notes, and after visit summary will be available on My Chart. We strongly encourage you to use this feature. If lab results are abnormal the clinic will contact you with the appropriate steps. If the clinic does not contact you assume the results are satisfactory. You can always view your results on My Chart. If you have questions regarding your health or results, please contact the clinic during office hours. You can also ask questions on My Chart.  We at [facility] are grateful that you chose us  to provide your care. We strive to provide evidence-based and compassionate care and are always looking for feedback. If you get a survey from the clinic please complete this so we can hear your opinions.  DASH Eating Plan DASH stands for Dietary Approaches to Stop Hypertension. The DASH eating plan is a healthy eating plan that has been shown to: Lower high blood pressure (hypertension). Reduce your risk for type 2 diabetes, heart disease, and stroke. Help with weight loss. What are tips for following this plan? Reading food labels Check food labels for the amount of salt (sodium) per serving. Choose foods with less than 5 percent of the Daily Value (DV) of sodium. In general, foods with less than 300 milligrams (mg) of sodium per serving fit into this eating plan. To find whole grains, look for the word whole as the first word in the ingredient list. Shopping Buy products labeled as low-sodium or no salt added. Buy fresh foods. Avoid canned  foods and pre-made or frozen meals. Cooking Try not to add salt when you cook. Use salt-free seasonings or herbs instead of table salt or sea salt. Check with your health care provider or pharmacist before using salt substitutes. Do not fry foods. Cook foods in healthy ways, such as baking, boiling, grilling, roasting, or broiling. Cook using oils that are good for your heart. These include olive, canola, avocado, soybean, and sunflower oil. Meal planning  Eat a balanced diet. This should include: 4 or more servings of fruits and 4 or more servings of vegetables each day. Try to fill half of your plate with fruits and vegetables. 6-8 servings of whole grains each day. 6 or less servings of lean meat, poultry, or fish each day. 1 oz is 1 serving. A 3 oz (85 g) serving of meat is about the same size as the palm of your hand. One egg is 1 oz (28 g). 2-3 servings of low-fat dairy each day. One serving is 1 cup (237 mL). 1 serving of nuts, seeds, or beans 5 times each week. 2-3 servings of heart-healthy fats. Healthy fats called omega-3 fatty acids are found in foods such as walnuts, flaxseeds, fortified milks, and eggs. These fats are also found in cold-water fish, such as sardines, salmon, and mackerel. Limit how much you eat of: Canned or prepackaged foods. Food that is high in trans fat, such as fried foods. Food that is high in saturated fat, such as fatty meat. Desserts and other sweets, sugary drinks, and other foods with added sugar. Full-fat  dairy products. Do not salt foods before eating. Do not eat more than 4 egg yolks a week. Try to eat at least 2 vegetarian meals a week. Eat more home-cooked food and less restaurant, buffet, and fast food. Lifestyle When eating at a restaurant, ask if your food can be made with less salt or no salt. If you drink alcohol: Limit how much you have to: 0-1 drink a day if you are female. 0-2 drinks a day if you are female. Know how much alcohol is in  your drink. In the [place] one drink is one 12 oz bottle of beer (355 mL), one 5 oz glass of wine (148 mL), or one 1 oz glass of hard liquor (44 mL). General information Avoid eating more than 2,300 mg of salt a day. If you have hypertension, you may need to reduce your sodium intake to 1,500 mg a day. Work with your provider to stay at a healthy body weight or lose weight. Ask what the best weight range is for you. On most days of the week, get at least 30 minutes of exercise that causes your heart to beat faster. This may include walking, swimming, or biking. Work with your provider or dietitian to adjust your eating plan to meet your specific calorie needs. What foods should I eat? Fruits All fresh, dried, or frozen fruit. Canned fruits that are in their natural juice and do not have sugar added to them. Vegetables Fresh or frozen vegetables that are raw, steamed, roasted, or grilled. Low-sodium or reduced-sodium tomato and vegetable juice. Low-sodium or reduced-sodium tomato sauce and tomato paste. Low-sodium or reduced-sodium canned vegetables. Grains Whole-grain or whole-wheat bread. Whole-grain or whole-wheat pasta. Brown rice. [place]. Bulgur. Whole-grain and low-sodium cereals. Pita bread. Low-fat, low-sodium crackers. Whole-wheat flour tortillas. Meats and other proteins Skinless chicken or [place]. Ground chicken or [place]. Pork with fat trimmed off. Fish and seafood. Egg whites. Dried beans, peas, or lentils. Unsalted nuts, nut butters, and seeds. Unsalted canned beans. Lean cuts of beef with fat trimmed off. Low-sodium, lean precooked or cured meat, such as sausages or meat loaves. Dairy Low-fat (1%) or fat-free (skim) milk. Reduced-fat, low-fat, or fat-free cheeses. Nonfat, low-sodium ricotta or cottage cheese. Low-fat or nonfat yogurt. Low-fat, low-sodium cheese. Fats and oils Soft margarine without trans fats. Vegetable oil. Reduced-fat, low-fat, or light mayonnaise and salad  dressings (reduced-sodium). Canola, safflower, olive, avocado, soybean, and sunflower oils. Avocado. Seasonings and condiments Herbs. Spices. Seasoning mixes without salt. Other foods Unsalted popcorn and pretzels. Fat-free sweets. The items listed above may not be all the foods and drinks you can have. Talk to a dietitian to learn more. What foods should I avoid? Fruits Canned fruit in a light or heavy syrup. Fried fruit. Fruit in cream or butter sauce. Vegetables Creamed or fried vegetables. Vegetables in a cheese sauce. Regular canned vegetables that are not marked as low-sodium or reduced-sodium. Regular canned tomato sauce and paste that are not marked as low-sodium or reduced-sodium. Regular tomato and vegetable juices that are not marked as low-sodium or reduced-sodium. Dene. Olives. Grains Baked goods made with fat, such as croissants, muffins, or some breads. Dry pasta or rice meal packs. Meats and other proteins Fatty cuts of meat. Ribs. Fried meat. [place]. Bologna, salami, and other precooked or cured meats, such as sausages or meat loaves, that are not lean and low in sodium. Fat from the back of a pig (fatback). Bratwurst. Salted nuts and seeds. Canned beans with added salt. Canned  or smoked fish. Whole eggs or egg yolks. Chicken or [place] with skin. Dairy Whole or 2% milk, cream, and half-and-half. Whole or full-fat cream cheese. Whole-fat or sweetened yogurt. Full-fat cheese. Nondairy creamers. Whipped toppings. Processed cheese and cheese spreads. Fats and oils Butter. Stick margarine. Lard. Shortening. Ghee. Bacon fat. Tropical oils, such as coconut, palm kernel, or palm oil. Seasonings and condiments Onion salt, garlic salt, seasoned salt, table salt, and sea salt. Worcestershire sauce. Tartar sauce. Barbecue sauce. Teriyaki sauce. Soy sauce, including reduced-sodium soy sauce. Steak sauce. Canned and packaged gravies. Fish sauce. Oyster sauce. Cocktail sauce. Store-bought  horseradish. Ketchup. Mustard. Meat flavorings and tenderizers. Bouillon cubes. Hot sauces. Pre-made or packaged marinades. Pre-made or packaged taco seasonings. Relishes. Regular salad dressings. Other foods Salted popcorn and pretzels. The items listed above may not be all the foods and drinks you should avoid. Talk to a dietitian to learn more. Where to find more information National Heart, Lung, and Blood Institute (NHLBI): BuffaloDryCleaner.gl American Heart Association (AHA): heart.org Academy of Nutrition and Dietetics: eatright.org National Kidney Foundation (NKF): kidney.org This information is not intended to replace advice given to you by your health care provider. Make sure you discuss any questions you have with your health care provider. Document Revised: 12/17/2022 Document Reviewed: 12/17/2022 Elsevier Patient Education  2024 [employer].

## 2024-09-25 ENCOUNTER — Encounter: Payer: Self-pay | Admitting: Nurse Practitioner

## 2024-09-25 ENCOUNTER — Ambulatory Visit: Admitting: Nurse Practitioner

## 2024-09-25 VITALS — BP 134/80 | HR 72 | Temp 98.2°F | Ht 67.0 in | Wt 219.6 lb

## 2024-09-25 DIAGNOSIS — E6609 Other obesity due to excess calories: Secondary | ICD-10-CM

## 2024-09-25 DIAGNOSIS — Z6831 Body mass index (BMI) 31.0-31.9, adult: Secondary | ICD-10-CM

## 2024-09-25 DIAGNOSIS — Z23 Encounter for immunization: Secondary | ICD-10-CM

## 2024-09-25 DIAGNOSIS — I10 Essential (primary) hypertension: Secondary | ICD-10-CM

## 2024-09-25 DIAGNOSIS — E782 Mixed hyperlipidemia: Secondary | ICD-10-CM

## 2024-09-25 DIAGNOSIS — E66811 Obesity, class 1: Secondary | ICD-10-CM

## 2024-09-25 MED ORDER — LOSARTAN POTASSIUM 100 MG PO TABS: 100.0000 mg | ORAL_TABLET | Freq: Every day | ORAL | 4 refills | Status: AC

## 2024-09-25 MED ORDER — ROSUVASTATIN CALCIUM 10 MG PO TABS: 10.0000 mg | ORAL_TABLET | Freq: Every day | ORAL | 4 refills | Status: AC

## 2024-09-25 MED ORDER — AMLODIPINE BESYLATE 5 MG PO TABS: 5.0000 mg | ORAL_TABLET | Freq: Every day | ORAL | 4 refills | Status: AC

## 2024-09-25 NOTE — Assessment & Plan Note (Signed)
Ongoing. Continue Rosuvastatin at current dosing and adjust as needed.  Lipid panel today.

## 2024-09-25 NOTE — Assessment & Plan Note (Signed)
 Chronic, stable.  BP stable in office today and on home checks. Will continue current medication regimen and adjust as needed.  Continue to monitor BP at home regularly and focus on DASH diet.  Refills as needed.  LABS: BMP.  Return in 6 months.

## 2024-09-25 NOTE — Progress Notes (Signed)
 BP 134/80 (BP Location: Left Arm, Cuff Size: Normal)   Pulse 72   Temp 98.2 F (36.8 C) (Oral)   Ht 5' 7 (1.702 m)   Wt 219 lb 9.6 oz (99.6 kg)   LMP  (LMP Unknown)   SpO2 97%   BMI 34.39 kg/m    Subjective:    Patient ID: Mary Wood, female    DOB: 06-20-1956, 68 y.o.   MRN: [id number]  HPI: Mary Wood is a 68 y.o. female  Chief Complaint  Patient presents with   Hyperlipidemia   Hypertension   HYPERTENSION Takes Losartan  100 MG and Amlodipine  5 MG daily + Crestor  10 MG daily. Continues to walk and use pool to exercise. Hypertension status: controlled  Satisfied with current treatment? yes Duration of hypertension: chronic BP monitoring frequency: once a week BP range: 130/70 range on average BP medication side effects:  no Medication compliance: good compliance Aspirin : no Recurrent headaches: no  Visual changes: no Palpitations: no Dyspnea: no Chest pain: no Lower extremity edema: if traveling for long periods Dizzy/lightheaded: no  The ASCVD Risk score (Arnett DK, et al., 2019) failed to calculate for the following reasons:   The valid total cholesterol range is 130 to 320 mg/dL  Relevant past medical, surgical, family and social history reviewed and updated as indicated. Interim medical history since our last visit reviewed. Allergies and medications reviewed and updated.  Review of Systems  Constitutional:  Negative for activity change, appetite change, diaphoresis, fatigue and fever.  Respiratory:  Negative for cough, chest tightness and shortness of breath.   Cardiovascular:  Negative for chest pain, palpitations and leg swelling.  Gastrointestinal: Negative.   Neurological: Negative.   Psychiatric/Behavioral: Negative.     Per HPI unless specifically indicated above     Objective:    BP 134/80 (BP Location: Left Arm, Cuff Size: Normal)   Pulse 72   Temp 98.2 F (36.8 C) (Oral)   Ht 5' 7 (1.702 m)   Wt 219 lb 9.6 oz (99.6 kg)   LMP   (LMP Unknown)   SpO2 97%   BMI 34.39 kg/m   Wt Readings from Last 3 Encounters:  09/25/24 219 lb 9.6 oz (99.6 kg)  05/11/24 220 lb 9.6 oz (100.1 kg)  01/31/24 218 lb 6.4 oz (99.1 kg)    Physical Exam Vitals and nursing note reviewed.  Constitutional:      General: She is awake. She is not in acute distress.    Appearance: She is well-developed and well-groomed. She is obese. She is not ill-appearing or toxic-appearing.  HENT:     Head: Normocephalic.     Right Ear: Hearing and external ear normal.     Left Ear: Hearing and external ear normal.  Eyes:     General: Lids are normal.        Right eye: No discharge.        Left eye: No discharge.     Conjunctiva/sclera: Conjunctivae normal.     Pupils: Pupils are equal, round, and reactive to light.  Neck:     Thyroid : No thyromegaly.     Vascular: No carotid bruit.  Cardiovascular:     Rate and Rhythm: Normal rate and regular rhythm.     Heart sounds: Normal heart sounds. No murmur heard.    No gallop.  Pulmonary:     Effort: Pulmonary effort is normal. No accessory muscle usage or respiratory distress.     Breath sounds: Normal breath sounds.  Abdominal:     General: Bowel sounds are normal. There is no distension.     Palpations: Abdomen is soft.     Tenderness: There is no abdominal tenderness.  Musculoskeletal:     Cervical back: Normal range of motion and neck supple.     Right lower leg: No edema.     Left lower leg: No edema.  Lymphadenopathy:     Cervical: No cervical adenopathy.  Skin:    General: Skin is warm and dry.  Neurological:     Mental Status: She is alert and oriented to person, place, and time.     Deep Tendon Reflexes: Reflexes are normal and symmetric.     Reflex Scores:      Brachioradialis reflexes are 2+ on the right side and 2+ on the left side.      Patellar reflexes are 2+ on the right side and 2+ on the left side. Psychiatric:        Attention and Perception: Attention normal.        Mood  and Affect: Mood normal.        Speech: Speech normal.        Behavior: Behavior normal. Behavior is cooperative.        Thought Content: Thought content normal.    Results for orders placed or performed in visit on 01/31/24  Microalbumin, Urine Waived   Collection Time: 01/31/24  8:22 AM  Result Value Ref Range   Microalb, Ur Waived 30 (H) 0 - 19 mg/L   Creatinine, Urine Waived 200 10 - 300 mg/dL   Microalb/Creat Ratio <30 <30 mg/g  CBC with Differential/Platelet   Collection Time: 01/31/24  8:27 AM  Result Value Ref Range   WBC 6.6 3.4 - 10.8 x10E3/uL   RBC 4.25 3.77 - 5.28 x10E6/uL   Hemoglobin 13.1 11.1 - 15.9 g/dL   Hematocrit 60.3 65.9 - 46.6 %   MCV 93 79 - 97 fL   MCH 30.8 26.6 - 33.0 pg   MCHC 33.1 31.5 - 35.7 g/dL   RDW 88.1 88.2 - 84.5 %   Platelets 392 150 - 450 x10E3/uL   Neutrophils 48 Not Estab. %   Lymphs 40 Not Estab. %   Monocytes 9 Not Estab. %   Eos 3 Not Estab. %   Basos 0 Not Estab. %   Neutrophils Absolute 3.2 1.4 - 7.0 x10E3/uL   Lymphocytes Absolute 2.6 0.7 - 3.1 x10E3/uL   Monocytes Absolute 0.6 0.1 - 0.9 x10E3/uL   EOS (ABSOLUTE) 0.2 0.0 - 0.4 x10E3/uL   Basophils Absolute 0.0 0.0 - 0.2 x10E3/uL   Immature Granulocytes 0 Not Estab. %   Immature Grans (Abs) 0.0 0.0 - 0.1 x10E3/uL  Comprehensive metabolic panel   Collection Time: 01/31/24  8:27 AM  Result Value Ref Range   Glucose 89 70 - 99 mg/dL   BUN 18 8 - 27 mg/dL   Creatinine, Ser 9.33 0.57 - 1.00 mg/dL   eGFR 95 >40 fO/fpw/8.26   BUN/Creatinine Ratio 27 12 - 28   Sodium 140 134 - 144 mmol/L   Potassium 4.3 3.5 - 5.2 mmol/L   Chloride 102 96 - 106 mmol/L   CO2 23 20 - 29 mmol/L   Calcium  9.2 8.7 - 10.3 mg/dL   Total Protein 7.6 6.0 - 8.5 g/dL   Albumin 4.6 3.9 - 4.9 g/dL   Globulin, Total 3.0 1.5 - 4.5 g/dL   Bilirubin Total 0.5 0.0 - 1.2 mg/dL   Alkaline  Phosphatase 91 44 - 121 IU/L   AST 16 0 - 40 IU/L   ALT 16 0 - 32 IU/L  TSH   Collection Time: 01/31/24  8:27 AM  Result  Value Ref Range   TSH 1.600 0.450 - 4.500 uIU/mL  Lipid Panel w/o Chol/HDL Ratio   Collection Time: 01/31/24  8:27 AM  Result Value Ref Range   Cholesterol, Total 126 100 - 199 mg/dL   Triglycerides 92 0 - 149 mg/dL   HDL 54 >60 mg/dL   VLDL Cholesterol Cal 17 5 - 40 mg/dL   LDL Chol Calc (NIH) 55 0 - 99 mg/dL  VITAMIN D  25 Hydroxy (Vit-D Deficiency, Fractures)   Collection Time: 01/31/24  8:27 AM  Result Value Ref Range   Vit D, 25-Hydroxy 50.4 30.0 - 100.0 ng/mL      Assessment & Plan:   Problem List Items Addressed This Visit       Cardiovascular and Mediastinum   Hypertension - Primary   Chronic, stable.  BP stable in office today and on home checks. Will continue current medication regimen and adjust as needed.  Continue to monitor BP at home regularly and focus on DASH diet.  Refills as needed.  LABS: BMP.  Return in 6 months.      Relevant Orders   Basic metabolic panel with GFR     Other   Obesity   BMI 34.39. Recommended eating smaller high protein, low fat meals more frequently and exercising 30 mins a day 5 times a week with a goal of 10-15lb weight loss in the next 3 months. Patient voiced their understanding and motivation to adhere to these recommendations.  Would benefit from Ozempic, but insurance is not covering.       Mixed hyperlipidemia   Ongoing. Continue Rosuvastatin  at current dosing and adjust as needed.  Lipid panel today.      Relevant Orders   Lipid Panel w/o Chol/HDL Ratio   Other Visit Diagnoses       Flu vaccine need       Flu vaccine today, educated patient.   Relevant Orders   Flu vaccine HIGH DOSE PF(Fluzone Trivalent) (Completed)        Follow up plan: Return in about 6 months (around 03/26/2025) for Annual Physical after 01/30/25.

## 2024-09-25 NOTE — Assessment & Plan Note (Signed)
 BMI 34.39. Recommended eating smaller high protein, low fat meals more frequently and exercising 30 mins a day 5 times a week with a goal of 10-15lb weight loss in the next 3 months. Patient voiced their understanding and motivation to adhere to these recommendations.  Would benefit from Ozempic, but insurance is not covering.

## 2024-09-26 ENCOUNTER — Ambulatory Visit: Payer: Self-pay | Admitting: Nurse Practitioner

## 2024-09-26 LAB — BASIC METABOLIC PANEL WITH GFR
BUN/Creatinine Ratio: 16 (ref 12–28)
BUN: 11 mg/dL (ref 8–27)
CO2: 23 mmol/L (ref 20–29)
Calcium: 9 mg/dL (ref 8.7–10.3)
Chloride: 104 mmol/L (ref 96–106)
Creatinine, Ser: 0.67 mg/dL (ref 0.57–1.00)
Glucose: 94 mg/dL (ref 70–99)
Potassium: 4.2 mmol/L (ref 3.5–5.2)
Sodium: 139 mmol/L (ref 134–144)
eGFR: 94 mL/min/1.73 (ref >59)

## 2024-09-26 LAB — LIPID PANEL W/O CHOL/HDL RATIO
Cholesterol, Total: 100 mg/dL (ref 100–199)
HDL: 46 mg/dL (ref >39)
LDL Chol Calc (NIH): 40 mg/dL (ref 0–99)
Triglycerides: 65 mg/dL (ref 0–149)
VLDL Cholesterol Cal: 14 mg/dL (ref 5–40)

## 2024-09-26 NOTE — Progress Notes (Signed)
 Contacted via MyChart  Good afternoon Mary Wood, your labs have returned and look fabulous.  No changes needed.  Great job!!! Any questions? Keep being amazing!!  Thank you for allowing me to participate in your care.  I appreciate you. Kindest regards, Hannalee Castor

## 2025-03-28 ENCOUNTER — Encounter: Admitting: Nurse Practitioner
